# Patient Record
Sex: Male | Born: 1970 | Race: White | Hispanic: No | Marital: Single | Smoking: Never smoker
Health system: Southern US, Community
[De-identification: ages and names within clinical notes are randomized; demographics above are authoritative.]

## PROBLEM LIST (undated history)

## (undated) DIAGNOSIS — M1220 Villonodular synovitis (pigmented), unspecified site: Secondary | ICD-10-CM

## (undated) DIAGNOSIS — R413 Other amnesia: Secondary | ICD-10-CM

## (undated) DIAGNOSIS — M1990 Unspecified osteoarthritis, unspecified site: Secondary | ICD-10-CM

## (undated) DIAGNOSIS — I83891 Varicose veins of right lower extremities with other complications: Secondary | ICD-10-CM

## (undated) DIAGNOSIS — Z950 Presence of cardiac pacemaker: Secondary | ICD-10-CM

## (undated) DIAGNOSIS — G4730 Sleep apnea, unspecified: Secondary | ICD-10-CM

## (undated) DIAGNOSIS — R011 Cardiac murmur, unspecified: Secondary | ICD-10-CM

## (undated) DIAGNOSIS — T7840XA Allergy, unspecified, initial encounter: Secondary | ICD-10-CM

## (undated) DIAGNOSIS — F419 Anxiety disorder, unspecified: Secondary | ICD-10-CM

## (undated) DIAGNOSIS — M5430 Sciatica, unspecified side: Secondary | ICD-10-CM

## (undated) DIAGNOSIS — F32A Depression, unspecified: Secondary | ICD-10-CM

## (undated) DIAGNOSIS — E079 Disorder of thyroid, unspecified: Secondary | ICD-10-CM

## (undated) DIAGNOSIS — I499 Cardiac arrhythmia, unspecified: Secondary | ICD-10-CM

## (undated) HISTORY — PX: CARDIAC CATHETERIZATION: SHX172

## (undated) HISTORY — DX: Other amnesia: R41.3

## (undated) HISTORY — PX: OTHER SURGICAL HISTORY: SHX169

## (undated) HISTORY — DX: Sciatica, unspecified side: M54.30

## (undated) HISTORY — DX: Varicose veins of right lower extremity with other complications: I83.891

## (undated) HISTORY — DX: Presence of cardiac pacemaker: Z95.0

## (undated) HISTORY — DX: Disorder of thyroid, unspecified: E07.9

## (undated) HISTORY — DX: Villonodular synovitis (pigmented), unspecified site: M12.20

## (undated) HISTORY — DX: Sleep apnea, unspecified: G47.30

## (undated) HISTORY — DX: Unspecified osteoarthritis, unspecified site: M19.90

## (undated) HISTORY — DX: Allergy, unspecified, initial encounter: T78.40XA

## (undated) HISTORY — PX: INSERT / REPLACE / REMOVE PACEMAKER: SUR710

## (undated) HISTORY — DX: Cardiac murmur, unspecified: R01.1

---

## 2015-03-29 ENCOUNTER — Encounter: Payer: Self-pay | Admitting: Internal Medicine

## 2016-05-18 ENCOUNTER — Encounter: Payer: Self-pay | Admitting: Internal Medicine

## 2018-03-14 ENCOUNTER — Encounter: Payer: Self-pay | Admitting: Family Medicine

## 2018-03-14 ENCOUNTER — Ambulatory Visit: Payer: Medicare Other | Admitting: Family Medicine

## 2018-03-14 VITALS — BP 120/78 | HR 65 | Temp 98.6°F | Ht 74.0 in | Wt 247.9 lb

## 2018-03-14 DIAGNOSIS — M549 Dorsalgia, unspecified: Secondary | ICD-10-CM

## 2018-03-14 DIAGNOSIS — G4733 Obstructive sleep apnea (adult) (pediatric): Secondary | ICD-10-CM

## 2018-03-14 DIAGNOSIS — Z9989 Dependence on other enabling machines and devices: Secondary | ICD-10-CM

## 2018-03-14 DIAGNOSIS — M1220 Villonodular synovitis (pigmented), unspecified site: Secondary | ICD-10-CM | POA: Insufficient documentation

## 2018-03-14 DIAGNOSIS — R918 Other nonspecific abnormal finding of lung field: Secondary | ICD-10-CM

## 2018-03-14 DIAGNOSIS — G8929 Other chronic pain: Secondary | ICD-10-CM | POA: Insufficient documentation

## 2018-03-14 DIAGNOSIS — R001 Bradycardia, unspecified: Secondary | ICD-10-CM | POA: Diagnosis not present

## 2018-03-14 DIAGNOSIS — F411 Generalized anxiety disorder: Secondary | ICD-10-CM

## 2018-03-14 DIAGNOSIS — N400 Enlarged prostate without lower urinary tract symptoms: Secondary | ICD-10-CM

## 2018-03-14 DIAGNOSIS — G894 Chronic pain syndrome: Secondary | ICD-10-CM

## 2018-03-14 DIAGNOSIS — M2140 Flat foot [pes planus] (acquired), unspecified foot: Secondary | ICD-10-CM

## 2018-03-14 DIAGNOSIS — E039 Hypothyroidism, unspecified: Secondary | ICD-10-CM

## 2018-03-14 DIAGNOSIS — M159 Polyosteoarthritis, unspecified: Secondary | ICD-10-CM

## 2018-03-14 DIAGNOSIS — M109 Gout, unspecified: Secondary | ICD-10-CM

## 2018-03-14 NOTE — Progress Notes (Signed)
Subjective:  By signing my name below, I, Stann Ore, attest that this documentation has been prepared under the direction and in the presence of Meredith Staggers, MD. Electronically Signed: Stann Ore, Scribe. 03/14/2018 , 3:10 PM .  Patient was seen in Room 9 .   Patient ID: Mark Strong, male    DOB: 10/21/1970, 47 y.o.   MRN: [id number] Chief Complaint  Patient presents with  . Establish Care    give history and get to know each other  . Depression    PH Q9=3   HPI Mark Strong is a 47 y.o. male Here to establish care. Patient recently moved here from [place], [place] on Aug 8th. He came here due to cost of living, church and parents needed a cheaper assisted living. He is on Social Security Disability with back pain and pacemaker in placed. He used to live in [place], [place] for about couple of months.   He was followed by specialists and PCP in [place].  Orthopedist: PVNS, recheck every 6 months.  Blood specialist: enlarged spleen.  Oncology: both grandfather died of cancer, brother is in remission right now for cancer; father might have cancer now.  Neurologist: chronic back pain, sciatica, mild scoliosis, neck and upper back; no surgery done yet. He is on Belbuca and doing well on it, prescribed PCP; he has about 15 films left. He mentions he was previously on other narcotic pain medications, but he didn't like the side effects with them. He was also followed for generalized anxiety, taking Effexor 75 mg qd.  Cardiologist: history of bradycardia with pulse running 20-25. 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  Pulmonologist: he had 2-3 non-cancerous nodules in his lungs and was monitored every 6-12 months. Last seen on July 24th; per March 2018 note, there were 5 nodules noted. He denies any changes in size of the nodules. He denies history of biopsy. He denies history of COPD. He has a history of sleep apnea, and has a  CPAP machine at home. He denies any inhalers for his asthma.  Urologist: mildly enlarged prostate, followed every few months. He takes Flomax for it. He hasn't had biopsy done. PSA monitored.  Endocrinologist: history of hypothyroidism. Recently had thyroid biopsy done at the beginning of July, but didn't get enough cells. He was suggested another biopsy done in about 6 months. He mentions his prolactin levels were slightly elevated. His father has history of pituitary tumor.  Podiatrist: he has flat feet, and has special custom orthotics. He was developing hammer toes. He used to weigh about 310 lbs, but changed diet, and has been exercising with walking and biking.  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 He also had gout in the past, but hasn't had a flare in a while. He took meloxicam 15 mg prn.  Medications: He takes amlodipine 2.5 mg half pill qd. He takes Lipitor 10 mg qd.   Health Maintenance He declines flu shot today.    Positive Depression screening Depression screen [facility] 2/9 03/14/2018  Decreased Interest 1  Down, Depressed, Hopeless 1  PHQ - 2 Score 2  Altered sleeping 1  Tired, decreased energy 0  Change in appetite 0  Feeling bad or failure about yourself  0  Trouble concentrating 0  Moving slowly or fidgety/restless 0  Suicidal thoughts 0  PHQ-9 Score 3  Difficult doing work/chores Not difficult at all     Patient Active Problem List   Diagnosis  Date Noted  . 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 Chronic back pain 03/14/2018  . Bradycardia 03/14/2018   Past Medical History:  Diagnosis Date  . PVNS (pigmented villonodular synovitis)    History reviewed. No pertinent surgical history. No Known Allergies Prior to Admission medications   Not on File   Social History   Socioeconomic History  . Marital status: Single    Spouse name: Not on file  . Number of children: Not on file  . Years of education: Not on file  . Highest  education level: Not on file  Occupational History  . Not on file  Social Needs  . Financial resource strain: Not on file  . Food insecurity:    Worry: Not on file    Inability: Not on file  . Transportation needs:    Medical: Not on file    Non-medical: Not on file  Tobacco Use  . Smoking status: Never Smoker  . Smokeless tobacco: Never Used  Substance and Sexual Activity  . Alcohol use: Not Currently  . Drug use: Yes  . Sexual activity: Yes  Lifestyle  . Physical activity:    Days per week: Not on file    Minutes per session: Not on file  . Stress: Not on file  Relationships  . Social connections:    Talks on phone: Not on file    Gets together: Not on file    Attends religious service: Not on file    Active member of club or organization: Not on file    Attends meetings of clubs or organizations: Not on file    Relationship status: Not on file  . Intimate partner violence:    Fear of current or ex partner: Not on file    Emotionally abused: Not on file    Physically abused: Not on file    Forced sexual activity: Not on file  Other Topics Concern  . Not on file  Social History Narrative  . Not on file   Review of Systems  Constitutional: Negative for fatigue and unexpected weight change.  Eyes: Negative for visual disturbance.  Respiratory: Negative for cough, chest tightness and shortness of breath.   Cardiovascular: Negative for chest pain, palpitations and leg swelling.  Gastrointestinal: Negative for abdominal pain and blood in stool.  Neurological: Negative for dizziness, light-headedness and headaches.       Objective:   Physical Exam  Constitutional: He is oriented to person, place, and time. He appears well-developed and well-nourished. No distress.  HENT:  Head: Normocephalic and atraumatic.  Eyes: Pupils are equal, round, and reactive to light. EOM are normal.  Neck: Neck supple. No JVD present. Carotid bruit is not present.  Cardiovascular:  Normal rate, regular rhythm and normal heart sounds.  No murmur heard. Pulmonary/Chest: Effort normal and breath sounds normal. No respiratory distress. He has no rales.  Musculoskeletal: Normal range of motion. He exhibits no edema.  Neurological: He is alert and oriented to person, place, and time.  Skin: Skin is warm and dry.  Psychiatric: He has a normal mood and affect. His behavior is normal.  Nursing note and vitals reviewed.   Vitals:   03/14/18 1338 03/14/18 1346  BP: (!) 144/87 120/78  Pulse: 65   Temp: 98.6 F (37 C)   TempSrc: Oral   SpO2: 100%   Weight: 247 lb 14.4 oz (112.4 kg)   Height: 6\' 2"  (1.88 m)        Assessment & Plan:  Mark Strong is a 47 y.o. male Chronic pain disorder - Plan: Ambulatory referral to Pain Clinic  -Refer to pain management.  Initially had discussed possible refill of Belbuca if he is running out prior to that visit, but that med will need to be prescribed by pain management or provider certified to prescribe Suboxone.  -Continue gabapentin same dose  PVNS (pigmented villonodular synovitis) - Plan: Ambulatory referral to Pain Clinic  -Long-standing issue, referral as above.  May also need care with orthopedic oncology.  Chronic back pain, unspecified back location, unspecified back pain laterality - Plan: Ambulatory referral to Pain Clinic  Bradycardia - Plan: Ambulatory referral to Cardiology  -Referral to cardiology for ongoing care, asymptomatic at present and has pacemaker.  Lung nodules - Plan: Ambulatory referral to Pulmonology  -Reportedly stable, will refer to pulmonology for ongoing care and decision on monitoring frequency  OSA on CPAP - Plan: Ambulatory referral to Pulmonology  -Tolerating CPAP, no changes at this time.  Anxiety state  -Reports stable symptoms on Effexor, no changes at this time.  Enlarged prostate - Plan: Ambulatory referral to Urology  -Refer to urology for ongoing care and monitoring of  enlarged prostate.  Continue Flomax at this time  Hypothyroidism, unspecified type - Plan: Ambulatory referral to Endocrinology  -Refer to endocrinology for hypothyroidism treatment as well as further discussion of prior biopsies, possible nodule and need for further imaging  Gout, unspecified cause, unspecified chronicity, unspecified site - Plan: Ambulatory referral to Rheumatology Osteoarthritis of multiple joints, unspecified osteoarthritis type - Plan: Ambulatory referral to Rheumatology  -Previously followed by rheumatologist.  Overall stable gout but will refer to rheumatology locally for continued management.  If he remains stable, that can be followed here  Pes planus, unspecified laterality - Plan: Ambulatory referral to Podiatry  -Requested referral to podiatry for ongoing care given need for orthotics.  Recheck 1 month to review his health history further and to determine if further refills needed  No orders of the defined types were placed in this encounter.  Patient Instructions    Let me know if temporary refill of Belbuca needed prior to meeting with pain specialist.   I will place other referrals as we discussed.   Nice meeting you. Please follow up in next 1 month to review history further and medication refills/labs if needed.   If you have lab work done today you will be contacted with your lab results within the next 2 weeks.  If you have not heard from [place] then please contact us. The fastest way to get your results is to register for My Chart.   IF you received an x-ray today, you will receive an invoice from [facility]. Please contact [facility] at [phone] with questions or concerns regarding your invoice.   IF you received labwork today, you will receive an invoice from [facility]. Please contact [facility] at [phone] with questions or concerns regarding your invoice.   Our billing staff will not be able to assist you with questions  regarding bills from these companies.  You will be contacted with the lab results as soon as they are available. The fastest way to get your results is to activate your My Chart account. Instructions are located on the last page of this paperwork. If you have not heard from [place] regarding the results in 2 weeks, please contact this office.       I personally performed the services described in this documentation, which was scribed in my presence.  The recorded information has been reviewed and considered for accuracy and completeness, addended by me as needed, and agree with information above.  Signed,   Meredith Staggers, MD Primary Care at [facility].  03/16/18 12:34 PM

## 2018-03-14 NOTE — Patient Instructions (Addendum)
  Let me know if temporary refill of Belbuca needed prior to meeting with pain specialist.   I will place other referrals as we discussed.   Nice meeting you. Please follow up in next 1 month to review history further and medication refills/labs if needed.   If you have lab work done today you will be contacted with your lab results within the next 2 weeks.  If you have not heard from [place] then please contact us. The fastest way to get your results is to register for My Chart.   IF you received an x-ray today, you will receive an invoice from [facility]. Please contact [facility] at [phone] with questions or concerns regarding your invoice.   IF you received labwork today, you will receive an invoice from [facility]. Please contact [facility] at [phone] with questions or concerns regarding your invoice.   Our billing staff will not be able to assist you with questions regarding bills from these companies.  You will be contacted with the lab results as soon as they are available. The fastest way to get your results is to activate your My Chart account. Instructions are located on the last page of this paperwork. If you have not heard from [place] regarding the results in 2 weeks, please contact this office.

## 2018-03-31 ENCOUNTER — Telehealth: Payer: Self-pay | Admitting: Family Medicine

## 2018-03-31 NOTE — Telephone Encounter (Signed)
Copied from CRM [id number]. Topic: Quick Communication - Rx Refill/Question >> Mar 31, 2018  1:04 PM Maia Petties wrote: Medication: BELBUCA 75 MCG FILM  - pt will be out 10/17 and has appt with pain mgmt on 10/23 - he states Dr. Neva Seat advised he would send in medication if needed in between. Please advise.   Last OV Dr. Neva Seat 03/14/18 Last refill not from Dr. Neva Seat or [place] office - 03/04/18 no refills  Preferred Pharmacy (with phone number or street name): [id number] [place], [place] - [address] AT [facility] [address] [facility] [address] [phone] (Phone) [phone] (Fax)

## 2018-04-01 NOTE — Telephone Encounter (Signed)
Please advise 

## 2018-04-03 ENCOUNTER — Ambulatory Visit: Payer: Medicare Other

## 2018-04-03 ENCOUNTER — Ambulatory Visit: Payer: Medicare Other | Admitting: Podiatry

## 2018-04-03 ENCOUNTER — Encounter: Payer: Self-pay | Admitting: Podiatry

## 2018-04-03 VITALS — BP 124/78 | HR 60 | Resp 16

## 2018-04-03 DIAGNOSIS — L603 Nail dystrophy: Secondary | ICD-10-CM

## 2018-04-03 DIAGNOSIS — M2142 Flat foot [pes planus] (acquired), left foot: Secondary | ICD-10-CM | POA: Diagnosis not present

## 2018-04-03 DIAGNOSIS — M722 Plantar fascial fibromatosis: Secondary | ICD-10-CM | POA: Diagnosis not present

## 2018-04-03 DIAGNOSIS — M2141 Flat foot [pes planus] (acquired), right foot: Secondary | ICD-10-CM | POA: Diagnosis not present

## 2018-04-03 MED ORDER — BUPRENORPHINE HCL 75 MCG BU FILM: 1.0000 | ORAL_FILM | Freq: Two times a day (BID) | BUCCAL | 0 refills | Status: AC

## 2018-04-03 NOTE — Telephone Encounter (Signed)
Today I have utilized the [place] Controlled Substance Registry's online query to confirm compliance regarding the patient's controlled medications. My review reveals appropriate prescription fills.   Sent in 7d refill so pt can get to pain med appt on 10/23

## 2018-04-03 NOTE — Progress Notes (Signed)
Subjective:  Patient ID: Mark Strong, male    DOB: 20-Jan-1971,  MRN: [id number] HPI Chief Complaint  Patient presents with  . Foot Pain    Requesting foot exam - just moved from [place], [place] x 2 months ago, brought notes, xrays, wears custom orthotics, would like to get established with a podiatrist, sometimes tenderness medial left foot  . New Patient (Initial Visit)    47 y.o. male presents with the above complaint.   ROS: Denies fever chills nausea vomiting muscle aches pains calf pain back pain chest pain shortness of breath.  He is on disability from chronic foot pain neck pain and has a pacemaker.  Past Medical History:  Diagnosis Date  . Pacemaker   . PVNS (pigmented villonodular synovitis)    No past surgical history on file.  Current Outpatient Medications:  .  allopurinol (ZYLOPRIM) 300 MG tablet, Take 300 mg by mouth daily., Disp: , Rfl:  .  amLODipine (NORVASC) 2.5 MG tablet, Take 1.25 mg by mouth daily., Disp: , Rfl:  .  atorvastatin (LIPITOR) 10 MG tablet, , Disp: , Rfl: 0 .  Buprenorphine HCl (BELBUCA) 75 MCG FILM, Place 1 Film under the tongue 2 (two) times daily for 7 days., Disp: 14 each, Rfl: 0 .  Docusate Sodium (COLACE PO), Take by mouth., Disp: , Rfl:  .  gabapentin (NEURONTIN) 600 MG tablet, Take 600 mg by mouth 2 (two) times daily., Disp: , Rfl:  .  levothyroxine (SYNTHROID) 75 MCG tablet, Take 75 mcg by mouth daily before breakfast., Disp: , Rfl:  .  meloxicam (MOBIC) 15 MG tablet, Take 15 mg by mouth daily., Disp: , Rfl:  .  tadalafil (CIALIS) 5 MG tablet, Take 5 mg by mouth daily as needed for erectile dysfunction., Disp: , Rfl:  .  tamsulosin (FLOMAX) 0.4 MG CAPS capsule, Take 0.4 mg by mouth daily., Disp: , Rfl:  .  venlafaxine (EFFEXOR) 75 MG tablet, Take 75 mg by mouth daily., Disp: , Rfl:   Allergies  Allergen Reactions  . Betadine [Povidone Iodine] Rash   Review of Systems Objective:   Vitals:   04/03/18 1615  BP: 124/78    Pulse: 60  Resp: 16    General: Well developed, nourished, in no acute distress, alert and oriented x3   Dermatological: Skin is warm, dry and supple bilateral. Nails x 10 are well maintained; remaining integument appears unremarkable at this time. There are no open sores, no preulcerative lesions, no rash or signs of infection present.  Vascular: Dorsalis Pedis artery and Posterior Tibial artery pedal pulses are 2/4 bilateral with immedate capillary fill time. Pedal hair growth present. No varicosities and no lower extremity edema present bilateral.   Neruologic: Grossly intact via light touch bilateral. Vibratory intact via tuning fork bilateral. Protective threshold with Semmes Wienstein monofilament intact to all pedal sites bilateral. Patellar and Achilles deep tendon reflexes 2+ bilateral. No Babinski or clonus noted bilateral.   Musculoskeletal: No gross boney pedal deformities bilateral. No pain, crepitus, or limitation noted with foot and ankle range of motion bilateral. Muscular strength 5/5 in all groups tested bilateral.  He has pain on palpation of the left heel at the plantar fashion calcaneal insertion site.  He has pain on palpation of the fourth fifth tarsometatarsal joint and frontal plane range of motion the same area.  Has some mild tenderness overlying the sinus tarsi but no tenderness on end range of motion of the subtalar joint.  Gait: Unassisted, Nonantalgic.  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  Plan: Discussed etiology pathology conservative or surgical therapies.  At this point I discussed in great detail to him today about the appropriate shoes to wear with his orthotics and how to choose the appropriate pair shoes.  I feel that his issues that is resulting in some of the plantar fasciitis and lateral compensation.  We took samples of the skin and nail today to be sent for  pathologic evaluation and will await the results I will follow-up with him in 1 month.     Max T. [place], [place]

## 2018-04-03 NOTE — Addendum Note (Signed)
Addended by: Kristian Covey on: 04/03/2018 05:37 PM   Modules accepted: Orders

## 2018-04-03 NOTE — Telephone Encounter (Signed)
Patient called to say that medication required a Prior Auth and paperwork was sent over to the office to be filled and returned. Request a call back with information. Ph# [phone]

## 2018-04-04 ENCOUNTER — Telehealth: Payer: Self-pay | Admitting: Family Medicine

## 2018-04-04 DIAGNOSIS — G894 Chronic pain syndrome: Secondary | ICD-10-CM

## 2018-04-04 NOTE — Telephone Encounter (Signed)
Copied from CRM [id number]. Topic: Quick Communication - See Telephone Encounter >> Apr 04, 2018  4:14 PM Luanna Cole wrote: CRM for notification. See Telephone encounter for: 04/04/18.Buprenorphine HCl (BELBUCA) 75 MCG FILM [[id number]]  pt called and stated that insurance company did not accept the PA for medication. Pt would like a call back. Please advise [phone]

## 2018-04-04 NOTE — Telephone Encounter (Signed)
Sherry with [employer] requesting a call back to see if pts pain is severe enough that he requires around the clock opioid usage and are their any other pain managements such as hydrocodone, morphine and fentanyl are options for this member? CB#: [phone] opt 5. Ref: [id number]

## 2018-04-04 NOTE — Telephone Encounter (Signed)
Please advise 

## 2018-04-05 NOTE — Telephone Encounter (Signed)
Pt. Called again inquiring on status of prior authorization on medication. Pt. Requests to be called when a decision is reached on course of action.

## 2018-04-05 NOTE — Telephone Encounter (Signed)
Please check status.  Dr. Clelia Croft was able to send in temporary Rx for his buprenorphine, but not sure it was authorized. He has been on multiple other narcotic pain medications based on discussion at his visit with me, but did not tolerate due to side effects.

## 2018-04-07 NOTE — Telephone Encounter (Signed)
Patient was denied his medication Belbuca. The insurance company faxed a paper over needing more clinical information. Will placed paper in providers box at nurses station.

## 2018-04-07 NOTE — Telephone Encounter (Signed)
Patient is requesting a call back regarding the medication.  Patient had this happen previously he is wanting to know should he try another medication to avoid the insurance.  Please advise [phone]

## 2018-04-07 NOTE — Telephone Encounter (Signed)
FYI.  see note below.  

## 2018-04-08 ENCOUNTER — Telehealth: Payer: Self-pay | Admitting: *Deleted

## 2018-04-08 MED ORDER — OXYCODONE-ACETAMINOPHEN 5-325 MG PO TABS: 1.0000 | ORAL_TABLET | Freq: Four times a day (QID) | ORAL | 0 refills | Status: DC | PRN

## 2018-04-08 NOTE — Telephone Encounter (Signed)
I am ok sending in temporary med other than Belbuca until pain mgt eval tomorrow They may be able to get other med authorized easier.  what has he used before?

## 2018-04-08 NOTE — Addendum Note (Signed)
Addended by: Meredith Staggers R on: 04/08/2018 12:54 PM   Modules accepted: Orders

## 2018-04-08 NOTE — Telephone Encounter (Signed)
10 percocet sent to pharmacy until he meets with pain management tomorrow - meds to be filled further by pain mgt.

## 2018-04-08 NOTE — Telephone Encounter (Signed)
Spoke to patient the only medication he can take without the side effect is Percocet. Thank you

## 2018-04-08 NOTE — Telephone Encounter (Signed)
Spoke to patient, the Percocet prescription was sent to [facility]. The patient will call to check if medication is ready for pick up.

## 2018-04-14 ENCOUNTER — Encounter: Payer: Self-pay | Admitting: Family Medicine

## 2018-04-14 ENCOUNTER — Ambulatory Visit (INDEPENDENT_AMBULATORY_CARE_PROVIDER_SITE_OTHER): Payer: Medicare Other | Admitting: Family Medicine

## 2018-04-14 ENCOUNTER — Other Ambulatory Visit: Payer: Self-pay

## 2018-04-14 VITALS — BP 111/79 | HR 60 | Temp 97.7°F | Resp 16 | Ht 74.0 in | Wt 244.2 lb

## 2018-04-14 DIAGNOSIS — G894 Chronic pain syndrome: Secondary | ICD-10-CM | POA: Diagnosis not present

## 2018-04-14 DIAGNOSIS — N529 Male erectile dysfunction, unspecified: Secondary | ICD-10-CM | POA: Diagnosis not present

## 2018-04-14 DIAGNOSIS — M1220 Villonodular synovitis (pigmented), unspecified site: Secondary | ICD-10-CM

## 2018-04-14 MED ORDER — MELOXICAM 15 MG PO TABS: 15.0000 mg | ORAL_TABLET | Freq: Every day | ORAL | 1 refills | Status: DC

## 2018-04-14 MED ORDER — TADALAFIL 10 MG PO TABS: 10.0000 mg | ORAL_TABLET | ORAL | 1 refills | Status: DC | PRN

## 2018-04-14 NOTE — Progress Notes (Signed)
Subjective:  By signing my name below, I, Mark Strong, attest that this documentation has been prepared under the direction and in the presence of Mark Flood, MD Electronically Signed: Charline Bills, ED Scribe 04/14/2018 at 12:00 PM.   Patient ID: Mark Strong, male    DOB: November 28, 1970, 47 y.o.   MRN: [id number]  Chief Complaint  Patient presents with  . Medication Refill    need refills on allopurinol, meloxicam, cialis  . FORM FILLED OUT    need scat form filled out   HPI  Mark Strong is a 47 y.o. male who presents to Primary Care at [facility] for medication refill. New pt 9/27 with multiple chronic issues including chronic pain in back, feet, knees. Pt also presents with a form for SCAT due to difficulty ambulating to a regular stop or standing to wait for a bus. Pt has been ambulating with a cane lately due to bilat knee pain.  H/o Osteoarthritis and Gout Mobic prn, allopurinol for gout. No recent uric acid. Referred to rheumatology last OV, [facility] - Pt has been off allopurinol for sev months; states he has uric acid tested by rheumatology which was normal. Denies recent flare. He is still Mobic prn. Pt has an appointment with ortho on 10/31.  ED H/o enlarged prostate on Flomax, referred to urology at last OV. Has taken Cialis 5 mg in the past. - Pt was reportedly taking 10 mg every other wk, occasionally twice/wk. Denies back pain, change in vision or hearing, cp, sob, any new side-effects from meds. He is still waiting for a call from urology.  Patient Active Problem List   Diagnosis Date Noted  . 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 Chronic back pain 03/14/2018  . Bradycardia 03/14/2018   Past Medical History:  Diagnosis Date  . Pacemaker   . PVNS (pigmented villonodular synovitis)    History reviewed. No pertinent surgical history. Allergies  Allergen Reactions  . Betadine [Povidone Iodine] Rash   Prior to  Admission medications   Medication Sig Start Date End Date Taking? Authorizing Provider  allopurinol (ZYLOPRIM) 300 MG tablet Take 300 mg by mouth daily.    [provider]  amLODipine (NORVASC) 2.5 MG tablet Take 1.25 mg by mouth daily.    [provider]  atorvastatin (LIPITOR) 10 MG tablet  02/24/18   [provider]  Docusate Sodium (COLACE PO) Take by mouth.    [provider]  gabapentin (NEURONTIN) 600 MG tablet Take 600 mg by mouth 2 (two) times daily.    [provider]  levothyroxine (SYNTHROID) 75 MCG tablet Take 75 mcg by mouth daily before breakfast.    [provider]  meloxicam (MOBIC) 15 MG tablet Take 15 mg by mouth daily.    [provider]  oxyCODONE-acetaminophen (PERCOCET/ROXICET) 5-325 MG tablet Take 1 tablet by mouth every 6 (six) hours as needed for severe pain. 04/08/18   Mark Flood, MD  tadalafil (CIALIS) 5 MG tablet Take 5 mg by mouth daily as needed for erectile dysfunction.    [provider]  tamsulosin (FLOMAX) 0.4 MG CAPS capsule Take 0.4 mg by mouth daily.    [provider]  venlafaxine (EFFEXOR) 75 MG tablet Take 75 mg by mouth daily.    [provider]   Social History   Socioeconomic History  . Marital status: Single    Spouse name: Not on file  . Number of children: Not on file  . Years  of education: Not on file  . Highest education level: Not on file  Occupational History  . Not on file  Social Needs  . Financial resource strain: Not on file  . Food insecurity:    Worry: Not on file    Inability: Not on file  . Transportation needs:    Medical: Not on file    Non-medical: Not on file  Tobacco Use  . Smoking status: Never Smoker  . Smokeless tobacco: Never Used  Substance and Sexual Activity  . Alcohol use: Not Currently  . Drug use: Yes  . Sexual activity: Yes  Lifestyle  . Physical activity:    Days per week: Not on file    Minutes per  session: Not on file  . Stress: Not on file  Relationships  . Social connections:    Talks on phone: Not on file    Gets together: Not on file    Attends religious service: Not on file    Active member of club or organization: Not on file    Attends meetings of clubs or organizations: Not on file    Relationship status: Not on file  . Intimate partner violence:    Fear of current or ex partner: Not on file    Emotionally abused: Not on file    Physically abused: Not on file    Forced sexual activity: Not on file  Other Topics Concern  . Not on file  Social History Narrative  . Not on file   Review of Systems  HENT: Negative for hearing loss.   Eyes: Negative for visual disturbance.  Respiratory: Negative for chest tightness and shortness of breath.   Cardiovascular: Negative for chest pain.  Musculoskeletal: Positive for arthralgias. Negative for back pain.  Neurological: Negative for dizziness, light-headedness and headaches.      Objective:   Physical Exam  Constitutional: He is oriented to person, place, and time. He appears well-developed and well-nourished. No distress.  HENT:  Head: Normocephalic and atraumatic.  Eyes: Conjunctivae and EOM are normal.  Neck: Neck supple. No tracheal deviation present.  Cardiovascular: Normal rate and regular rhythm.  Pulmonary/Chest: Effort normal and breath sounds normal. No respiratory distress.  Musculoskeletal: Normal range of motion.  Neurological: He is alert and oriented to person, place, and time.  Skin: Skin is warm and dry.  Psychiatric: He has a normal mood and affect. His behavior is normal.  Nursing note and vitals reviewed.  Vitals:   04/14/18 1113  BP: 111/79  Pulse: 60  Resp: 16  Temp: 97.7 F (36.5 C)  TempSrc: Oral  SpO2: 98%  Weight: 244 lb 3.2 oz (110.8 kg)  Height: 6\' 2"  (1.88 m)      Assessment & Plan:   [name] is a 47 y.o. male Chronic pain syndrome - Plan: meloxicam (MOBIC) 15 MG  tablet PVNS (pigmented villonodular synovitis)  -Refilled meloxicam, but now has established with local pain management provider and will be seen orthopedics.  Would recommend discussing that medication with 1 of those providers for future refills.  -Allopurinol restart to be decided by rheumatology.  Erectile dysfunction, unspecified erectile dysfunction type - Plan: tadalafil (CIALIS) 10 MG tablet  -Tolerating Cialis, denies any new side effects, potential risks and side effects were discussed.  Refilled.  Phone numbers provided for other specialists that he has not heard from yet.  Recheck 3 months to review chronic medical issues.  Meds ordered this encounter  Medications  . meloxicam (MOBIC) 15  MG tablet    Sig: Take 1 tablet (15 mg total) by mouth daily.    Dispense:  30 tablet    Refill:  1  . tadalafil (CIALIS) 10 MG tablet    Sig: Take 1 tablet (10 mg total) by mouth every other day as needed for erectile dysfunction.    Dispense:  30 tablet    Refill:  1   Patient Instructions    Allopurinol restart can be decided by rheumatology.   mobic refilled, but talk to pain management and ortho about any changes.   I refilled Cialis.  You have the option of trying a half dose to see if that is just as effective.  Follow-up in about 3 months to review medications and evaluations with specialists at that time.  Sooner if needed.   Here are the numbers to the practices where you were referred:  Urology: Cardiology: Endocrinology:      If you have lab work done today you will be contacted with your lab results within the next 2 weeks.  If you have not heard from [place] then please contact us. The fastest way to get your results is to register for My Chart.   IF you received an x-ray today, you will receive an invoice from [facility]. Please contact [facility] at [phone] with questions or concerns regarding your invoice.   IF you received labwork today,  you will receive an invoice from [facility]. Please contact [facility] at [phone] with questions or concerns regarding your invoice.   Our billing staff will not be able to assist you with questions regarding bills from these companies.  You will be contacted with the lab results as soon as they are available. The fastest way to get your results is to activate your My Chart account. Instructions are located on the last page of this paperwork. If you have not heard from [place] regarding the results in 2 weeks, please contact this office.       I personally performed the services described in this documentation, which was scribed in my presence. The recorded information has been reviewed and considered for accuracy and completeness, addended by me as needed, and agree with information above.  Signed,   Meredith Staggers, MD Primary Care at [facility].  04/16/18 10:01 PM

## 2018-04-14 NOTE — Patient Instructions (Addendum)
  Allopurinol restart can be decided by rheumatology.   mobic refilled, but talk to pain management and ortho about any changes.   I refilled Cialis.  You have the option of trying a half dose to see if that is just as effective.  Follow-up in about 3 months to review medications and evaluations with specialists at that time.  Sooner if needed.   Here are the numbers to the practices where you were referred:  Urology: Cardiology: Endocrinology:      If you have lab work done today you will be contacted with your lab results within the next 2 weeks.  If you have not heard from [place] then please contact us. The fastest way to get your results is to register for My Chart.   IF you received an x-ray today, you will receive an invoice from [facility] Radiology. Please contact [facility]iology at [phone] with questions or concerns regarding your invoice.   IF you received labwork today, you will receive an invoice from [facility]. Please contact [facility] at [phone] with questions or concerns regarding your invoice.   Our billing staff will not be able to assist you with questions regarding bills from these companies.  You will be contacted with the lab results as soon as they are available. The fastest way to get your results is to activate your My Chart account. Instructions are located on the last page of this paperwork. If you have not heard from [place] regarding the results in 2 weeks, please contact this office.

## 2018-04-15 ENCOUNTER — Telehealth: Payer: Self-pay | Admitting: Family Medicine

## 2018-04-15 NOTE — Telephone Encounter (Signed)
Called pt to inform him to call his prefered pharmacy, [facility] [address] and have his RX transferred. Dr Neva Seat ordered it yesterday and it was sent to [facility] on [address]. He will call back if there are any problems.

## 2018-04-15 NOTE — Telephone Encounter (Signed)
Copied from CRM [id number]. Topic: Quick Communication - Rx Refill/Question >> Apr 15, 2018  3:15 PM [place], [place] D wrote: Medication: tadalafil (CIALIS) 10 MG tablet / Pt would like to have this medication sent to compounding pharmacy so that he can pay the cheaper price. Please advise.   Has the patient contacted their pharmacy? Yes.   (Agent: If no, request that the patient contact the pharmacy for the refill.) (Agent: If yes, when and what did the pharmacy advise?)  Preferred Pharmacy (with phone number or street name): [facility] - [place], [place] - [address] [phone] (Phone) [phone] (Fax)    Agent: Please be advised that RX refills may take up to 3 business days. We ask that you follow-up with your pharmacy.

## 2018-04-16 ENCOUNTER — Encounter: Payer: Self-pay | Admitting: Pulmonary Disease

## 2018-04-16 ENCOUNTER — Ambulatory Visit: Payer: Medicare Other | Admitting: Pulmonary Disease

## 2018-04-16 VITALS — BP 130/88 | HR 59

## 2018-04-16 DIAGNOSIS — Z9989 Dependence on other enabling machines and devices: Secondary | ICD-10-CM

## 2018-04-16 DIAGNOSIS — R911 Solitary pulmonary nodule: Secondary | ICD-10-CM

## 2018-04-16 DIAGNOSIS — R9389 Abnormal findings on diagnostic imaging of other specified body structures: Secondary | ICD-10-CM | POA: Diagnosis not present

## 2018-04-16 DIAGNOSIS — G4733 Obstructive sleep apnea (adult) (pediatric): Secondary | ICD-10-CM

## 2018-04-16 NOTE — Progress Notes (Signed)
Mark Strong    [id number]    [birth year]  Primary Care Physician:Greene, Asencion Partridge, MD  Referring Physician: [name], MD [address] [place], [place] [address]  Chief complaint:   Patient being seen for obstructive sleep apnea Multiple lung nodules  HPI:  Patient with obstructive sleep apnea diagnosed in April 2018-has been on auto titrating CPAP-improving symptoms Memory is better, fatigue is better, daytime sleepiness is better Multiple lung nodules diagnosed about 3 years ago CT scan report from July 2019-no new nodules, previously identified nodules are stable Patient has no significant symptoms of present Did have a couple of occasions when Mark Strong felt some shortness of breath that self dissipated  Past history of asthma  Dad did smoke, has a history of COPD No family history of lung cancer Mark Strong worked in Engineering geologist Never smoker  Patient usually goes to bed about 11 PM to 2 AM, takes about 20 minutes to fall asleep Wakes up about 2-3 times during the night, gets out of bed between 8 and 10 AM  Outpatient Encounter Medications as of 04/16/2018  Medication Sig  . allopurinol (ZYLOPRIM) 300 MG tablet Take 300 mg by mouth daily.  Marland Kitchen amLODipine (NORVASC) 2.5 MG tablet Take 1.25 mg by mouth daily.  Marland Kitchen atorvastatin (LIPITOR) 10 MG tablet   . Docusate Sodium (COLACE PO) Take by mouth.  . gabapentin (NEURONTIN) 600 MG tablet Take 600 mg by mouth 2 (two) times daily.  Marland Kitchen levothyroxine (SYNTHROID) 75 MCG tablet Take 75 mcg by mouth daily before breakfast.  . meloxicam (MOBIC) 15 MG tablet Take 1 tablet (15 mg total) by mouth daily.  Marland Kitchen oxyCODONE-acetaminophen (PERCOCET/ROXICET) 5-325 MG tablet Take 1 tablet by mouth every 6 (six) hours as needed for severe pain.  . tadalafil (CIALIS) 10 MG tablet Take 1 tablet (10 mg total) by mouth every other day as needed for erectile dysfunction.  . tamsulosin (FLOMAX) 0.4 MG CAPS capsule Take 0.4 mg by mouth daily.  Marland Kitchen  venlafaxine (EFFEXOR) 75 MG tablet Take 75 mg by mouth daily.   No facility-administered encounter medications on file as of 04/16/2018.     Allergies as of 04/16/2018 - Review Complete 04/16/2018  Allergen Reaction Noted  . Betadine [povidone iodine] Rash 04/03/2018    Past Medical History:  Diagnosis Date  . Pacemaker   . PVNS (pigmented villonodular synovitis)   . Sleep apnea     No past surgical history on file.  Family History  Problem Relation Age of Onset  . Arthritis Mother   . Depression Mother   . Heart disease Mother   . Arthritis Father   . COPD Father   . Diabetes Father   . Hyperlipidemia Father   . Hypertension Father   . Kidney disease Father     Social History   Socioeconomic History  . Marital status: Single    Spouse name: Not on file  . Number of children: Not on file  . Years of education: Not on file  . Highest education level: Not on file  Occupational History  . Not on file  Social Needs  . Financial resource strain: Not on file  . Food insecurity:    Worry: Not on file    Inability: Not on file  . Transportation needs:    Medical: Not on file    Non-medical: Not on file  Tobacco Use  . Smoking status: Never Smoker  . Smokeless tobacco: Never Used  Substance  and Sexual Activity  . Alcohol use: Not Currently  . Drug use: Yes  . Sexual activity: Yes  Lifestyle  . Physical activity:    Days per week: Not on file    Minutes per session: Not on file  . Stress: Not on file  Relationships  . Social connections:    Talks on phone: Not on file    Gets together: Not on file    Attends religious service: Not on file    Active member of club or organization: Not on file    Attends meetings of clubs or organizations: Not on file    Relationship status: Not on file  . Intimate partner violence:    Fear of current or ex partner: Not on file    Emotionally abused: Not on file    Physically abused: Not on file    Forced sexual activity:  Not on file  Other Topics Concern  . Not on file  Social History Narrative  . Not on file    Review of Systems  Constitutional: Negative.   Respiratory: Positive for apnea.   Cardiovascular: Negative.   Gastrointestinal: Negative.   Musculoskeletal: Negative.   Neurological: Negative.   Psychiatric/Behavioral: Positive for sleep disturbance.    Vitals:   04/16/18 1405  BP: 130/88  Pulse: (!) 59  SpO2: 94%     Physical Exam  Constitutional: Mark Strong is oriented to person, place, and time. Mark Strong appears well-developed and well-nourished.  HENT:  Head: Normocephalic and atraumatic.  Eyes: Pupils are equal, round, and reactive to light. Conjunctivae and EOM are normal. Right eye exhibits no discharge. Left eye exhibits no discharge.  Neck: Normal range of motion. Neck supple. No tracheal deviation present. No thyromegaly present.  Cardiovascular: Normal rate and regular rhythm.  Pulmonary/Chest: Breath sounds normal. No respiratory distress. Mark Strong has no wheezes.  Abdominal: Soft. Bowel sounds are normal. Mark Strong exhibits no distension. There is no tenderness.  Musculoskeletal: Normal range of motion. Mark Strong exhibits no edema or deformity.  Neurological: Mark Strong is alert and oriented to person, place, and time.  Skin: Skin is warm and dry. No erythema.   Data Reviewed: Multiple CT report reviewed-provided by patient  Assessment:   History of moderate obstructive sleep apnea .  Adequately treated at present time with no significant daytime symptoms  Multiple nodules .  Stable on recent CT in July 2019  Plan/Recommendations:  .  Repeat CT in a year to follow-up on the nodules .  Low risk patient  Continue CPAP use on a regular basis  We will see him back in the office in about a year  Encouraged to call with any significant symptoms   Virl Diamond MD [facility] 04/16/2018, 2:33 PM  CC: [name], MD

## 2018-04-16 NOTE — Addendum Note (Signed)
Addended by: Zane Herald R on: 04/16/2018 04:04 PM   Modules accepted: Orders

## 2018-04-16 NOTE — Patient Instructions (Signed)
Multiple lung nodules-stable compared to one a year ago-no new nodules from your recent report Obstructive sleep apnea-improved symptoms  Stable status  Repeat CT in July/August 2020 Obtain a pulmonary function study prior to next visit  I will see you back in the office in a year  Call with any concerns

## 2018-04-17 DIAGNOSIS — M25552 Pain in left hip: Secondary | ICD-10-CM | POA: Insufficient documentation

## 2018-04-18 ENCOUNTER — Other Ambulatory Visit (HOSPITAL_COMMUNITY): Payer: Self-pay | Admitting: Orthopedic Surgery

## 2018-04-18 DIAGNOSIS — M25561 Pain in right knee: Secondary | ICD-10-CM

## 2018-04-18 DIAGNOSIS — M25562 Pain in left knee: Principal | ICD-10-CM

## 2018-04-24 ENCOUNTER — Telehealth: Payer: Self-pay | Admitting: Pulmonary Disease

## 2018-04-24 NOTE — Telephone Encounter (Signed)
Download from machine is suboptimal  Too few days  Need to use it for more hours Avoid supine sleep if possible Elevate head of the bed if possible  Call us, call DME to address any issues that he has noted ready Needs to use the machine nightly

## 2018-04-24 NOTE — Telephone Encounter (Signed)
LMOM X1 

## 2018-04-25 ENCOUNTER — Ambulatory Visit: Payer: Medicare Other | Admitting: Endocrinology

## 2018-04-25 DIAGNOSIS — Z0289 Encounter for other administrative examinations: Secondary | ICD-10-CM

## 2018-04-25 NOTE — Telephone Encounter (Signed)
Called and spoke with patient regarding results.  Informed the patient of results and recommendations today. Pt will call [employer] to f/u with his acct in moving from [place] to [place] Pt verbalized understanding and denied any questions or concerns at this time.  Nothing further needed.

## 2018-04-25 NOTE — Telephone Encounter (Signed)
Patient returned phone call; pt contact # [phone]

## 2018-04-28 ENCOUNTER — Encounter: Payer: Self-pay | Admitting: Endocrinology

## 2018-04-28 ENCOUNTER — Ambulatory Visit: Payer: Medicare Other | Admitting: Endocrinology

## 2018-04-28 DIAGNOSIS — E039 Hypothyroidism, unspecified: Secondary | ICD-10-CM

## 2018-04-28 DIAGNOSIS — E041 Nontoxic single thyroid nodule: Secondary | ICD-10-CM | POA: Diagnosis not present

## 2018-04-28 LAB — TSH: TSH: 0.63 u[IU]/mL (ref 0.35–4.50)

## 2018-04-28 LAB — T4, FREE: Free T4: 0.83 ng/dL (ref 0.60–1.60)

## 2018-04-28 MED ORDER — LEVOTHYROXINE SODIUM 75 MCG PO TABS: 75.0000 ug | ORAL_TABLET | Freq: Every day | ORAL | 3 refills | Status: DC

## 2018-04-28 NOTE — Patient Instructions (Signed)
Let's recheck the ultrasound.  you will receive a phone call, about a day and time for an appointment. Based on the results, you may need to repeat the biopsy. Please continue the same medication.  Please come back for a follow-up appointment in 6 months.

## 2018-04-28 NOTE — Progress Notes (Signed)
Subjective:    Patient ID: Mark Strong, male    DOB: 1971-04-03, 47 y.o.   MRN: [id number]  HPI Pt is referred by Dr Neva Seat, for hypothyroidism.  Pt reports hypothyroidism was dx'ed in 2016.  He has been on prescribed thyroid hormone therapy since then.  He has never taken kelp or any other type of non-prescribed thyroid product.  He has never had thyroid surgery, or XRT to the neck.  He has never been on amiodarone or lithium.  He reports slight weight gain and assoc fatigue.  He has been on current 75 mcg/day, since 1 year.  He had a bx in mid-2019.  He says pathol recommended a repeat, due to inadeq material.   Past Medical History:  Diagnosis Date  . Pacemaker   . PVNS (pigmented villonodular synovitis)   . Sciatica   . Sleep apnea   . Thyroid disease   . Varicose veins of right leg with edema     Past Surgical History:  Procedure Laterality Date  . INSERT / REPLACE / REMOVE PACEMAKER     Biotronik, dual-chamber, 05/18/2016 for SSS  . Right greater saphenous vein ablation Right     Social History   Socioeconomic History  . Marital status: Single    Spouse name: Not on file  . Number of children: Not on file  . Years of education: Not on file  . Highest education level: Not on file  Occupational History  . Not on file  Social Needs  . Financial resource strain: Not on file  . Food insecurity:    Worry: Not on file    Inability: Not on file  . Transportation needs:    Medical: Not on file    Non-medical: Not on file  Tobacco Use  . Smoking status: Never Smoker  . Smokeless tobacco: Never Used  Substance and Sexual Activity  . Alcohol use: Not Currently  . Drug use: Yes  . Sexual activity: Yes  Lifestyle  . Physical activity:    Days per week: Not on file    Minutes per session: Not on file  . Stress: Not on file  Relationships  . Social connections:    Talks on phone: Not on file    Gets together: Not on file    Attends religious service: Not on  file    Active member of club or organization: Not on file    Attends meetings of clubs or organizations: Not on file    Relationship status: Not on file  . Intimate partner violence:    Fear of current or ex partner: Not on file    Emotionally abused: Not on file    Physically abused: Not on file    Forced sexual activity: Not on file  Other Topics Concern  . Not on file  Social History Narrative  . Not on file    Current Outpatient Medications on File Prior to Visit  Medication Sig Dispense Refill  . allopurinol (ZYLOPRIM) 300 MG tablet Take 300 mg by mouth daily.    Marland Kitchen amLODipine (NORVASC) 2.5 MG tablet Take 1.25 mg by mouth daily.    Marland Kitchen atorvastatin (LIPITOR) 10 MG tablet   0  . Docusate Sodium (COLACE PO) Take by mouth.    . gabapentin (NEURONTIN) 600 MG tablet Take 600 mg by mouth 2 (two) times daily.    . meloxicam (MOBIC) 15 MG tablet Take 1 tablet (15 mg total) by mouth daily. 30 tablet 1  .  oxyCODONE-acetaminophen (PERCOCET/ROXICET) 5-325 MG tablet Take 1 tablet by mouth every 6 (six) hours as needed for severe pain. 10 tablet 0  . tadalafil (CIALIS) 10 MG tablet Take 1 tablet (10 mg total) by mouth every other day as needed for erectile dysfunction. 30 tablet 1  . tamsulosin (FLOMAX) 0.4 MG CAPS capsule Take 0.4 mg by mouth daily.    Marland Kitchen venlafaxine (EFFEXOR) 75 MG tablet Take 75 mg by mouth daily.     No current facility-administered medications on file prior to visit.     Allergies  Allergen Reactions  . Betadine [Povidone Iodine] Rash    Family History  Problem Relation Age of Onset  . Arthritis Mother   . Depression Mother   . Heart disease Mother   . Arthritis Father   . COPD Father   . Diabetes Father   . Hyperlipidemia Father   . Hypertension Father   . Kidney disease Father   . Thyroid disease Father     BP 124/60 (BP Location: Right Arm, Patient Position: Sitting, Cuff Size: Normal)   Pulse 62   Ht 6\' 2"  (1.88 m)   Wt 242 lb (109.8 kg)   SpO2 95%    BMI 31.07 kg/m   Review of Systems denies depression, muscle cramps, sob, memory loss, constipation, numbness, blurry vision, cold intolerance, myalgias, rhinorrhea, easy bruising, and syncope.  He has chronic back pain and other arthralgias.  He has constipation.       Objective:   Physical Exam VS: see vs page GEN: no distress HEAD: head: no deformity eyes: no periorbital swelling, no proptosis external nose and ears are normal mouth: no lesion seen NECK: thyroid is slightly enlarged, but I cannot appreciate a nodule.   CHEST WALL: no deformity LUNGS: clear to auscultation CV: reg rate and rhythm, no murmur ABD: abdomen is soft, nontender.  no hepatosplenomegaly.  not distended.  no hernia MUSCULOSKELETAL: muscle bulk and strength are grossly normal.  no obvious joint swelling.  gait is normal and steady EXTEMITIES: no deformity.  no edema PULSES: no carotid bruit NEURO:  cn 2-12 grossly intact.   readily moves all 4's.  sensation is intact to touch on all 4's SKIN:  Normal texture and temperature.  No rash or suspicious lesion is visible.   NODES:  None palpable at the neck PSYCH: alert, well-oriented.  Does not appear anxious nor depressed.  outside test results are reviewed: [place] (2019): right thyroid nodule "up to 17 mm diameter."  New since 2018 TSH=1.4  I have reviewed outside records, and summarized: Pt was noted to have thyroid nodule, and referred here.  Chronic pain and ED were also addressed      Assessment & Plan:  Thyroid nodule.  Plan will be to repeat US prior to repeat bx Hypothyroidism (? related to the nodule): recheck today  Patient Instructions  Let's recheck the ultrasound.  you will receive a phone call, about a day and time for an appointment. Based on the results, you may need to repeat the biopsy. Please continue the same medication.  Please come back for a follow-up appointment in 6 months.

## 2018-04-30 ENCOUNTER — Ambulatory Visit: Payer: Medicare Other | Admitting: Internal Medicine

## 2018-04-30 ENCOUNTER — Encounter: Payer: Self-pay | Admitting: Internal Medicine

## 2018-04-30 VITALS — BP 122/82 | HR 116 | Ht 74.0 in | Wt 240.0 lb

## 2018-04-30 DIAGNOSIS — Z9989 Dependence on other enabling machines and devices: Secondary | ICD-10-CM | POA: Diagnosis not present

## 2018-04-30 DIAGNOSIS — G4733 Obstructive sleep apnea (adult) (pediatric): Secondary | ICD-10-CM | POA: Diagnosis not present

## 2018-04-30 DIAGNOSIS — Z950 Presence of cardiac pacemaker: Secondary | ICD-10-CM

## 2018-04-30 NOTE — Progress Notes (Signed)
OFFICE NOTE  Chief Complaint:  Establish cardiologist  Primary Care Physician: [name], MD  HPI:  Mark Strong is a 47 y.o. male with a past medial history significant for sinus node dysfunction with chronotropic incompetence status post Biotronik dual-chamber pacemaker on May 18, 2016 in [place].  He also has a history of sleep apnea, arthritis and chronic pain and is followed in a local pain clinic.  He recently moved to [facility] from [place].  He does have family in the area.  He was previously getting care by Dr. Cannon Kettle in [place].  I did receive records that were brought in by the patient today.  His last office visit indicated that the patient was doing well.  As mentioned he had a dual-chamber pacemaker placed for symptomatic sick sinus syndrome at age 48.  Prior to that he had had heart catheterization which showed normal coronary arteries.  He is also had prior endovenous laser ablation of the right greater saphenous vein and some stab phlebectomy for peripheral venous disease.  He has chronic pain and is disabled partially because of that.  He also was followed by rheumatology and is hypothyroid followed by endocrine.  His last pacemaker check was in June 2019.  PMHx:  Past Medical History:  Diagnosis Date  . Pacemaker   . PVNS (pigmented villonodular synovitis)   . Sciatica   . Sleep apnea   . Thyroid disease   . Varicose veins of right leg with edema     Past Surgical History:  Procedure Laterality Date  . INSERT / REPLACE / REMOVE PACEMAKER     Biotronik, dual-chamber, 05/18/2016 for SSS  . Right greater saphenous vein ablation Right     FAMHx:  Family History  Problem Relation Age of Onset  . Arthritis Mother   . Depression Mother   . Heart disease Mother   . Arthritis Father   . COPD Father   . Diabetes Father   . Hyperlipidemia Father   . Hypertension Father   . Kidney disease Father   . Thyroid disease  Father     SOCHx:   reports that he has never smoked. He has never used smokeless tobacco. He reports that he drank alcohol. He reports that he has current or past drug history.  ALLERGIES:  Allergies  Allergen Reactions  . Betadine [Povidone Iodine] Rash    ROS: Pertinent items noted in HPI and remainder of comprehensive ROS otherwise negative.  HOME MEDS: Current Outpatient Medications on File Prior to Visit  Medication Sig Dispense Refill  . allopurinol (ZYLOPRIM) 300 MG tablet Take 300 mg by mouth daily.    Marland Kitchen amLODipine (NORVASC) 2.5 MG tablet Take 1.25 mg by mouth daily.    Marland Kitchen atorvastatin (LIPITOR) 10 MG tablet   0  . Docusate Sodium (COLACE PO) Take by mouth.    . gabapentin (NEURONTIN) 600 MG tablet Take 600 mg by mouth 2 (two) times daily.    Marland Kitchen levothyroxine (SYNTHROID) 75 MCG tablet Take 1 tablet (75 mcg total) by mouth daily before breakfast. 90 tablet 3  . meloxicam (MOBIC) 15 MG tablet Take 1 tablet (15 mg total) by mouth daily. 30 tablet 1  . oxyCODONE-acetaminophen (PERCOCET/ROXICET) 5-325 MG tablet Take 1 tablet by mouth every 6 (six) hours as needed for severe pain. 10 tablet 0  . tadalafil (CIALIS) 10 MG tablet Take 1 tablet (10 mg total) by mouth every other day as needed for erectile  dysfunction. 30 tablet 1  . tamsulosin (FLOMAX) 0.4 MG CAPS capsule Take 0.4 mg by mouth daily.    Marland Kitchen. venlafaxine (EFFEXOR) 75 MG tablet Take 75 mg by mouth daily.     No current facility-administered medications on file prior to visit.     LABS/IMAGING: No results found for this or any previous visit (from the past 48 hour(s)). No results found.  LIPID PANEL: No results found for: CHOL, TRIG, HDL, CHOLHDL, VLDL, LDLCALC, LDLDIRECT   WEIGHTS: Wt Readings from Last 3 Encounters:  04/30/18 240 lb (108.9 kg)  04/28/18 242 lb (109.8 kg)  04/14/18 244 lb 3.2 oz (110.8 kg)    VITALS: BP 122/82   Pulse (!) 116   Ht 6\' 2"  (1.88 m)   Wt 240 lb (108.9 kg)   BMI 30.81 kg/m    EXAM: General appearance: alert, no distress and moderately obese Neck: no carotid bruit, no JVD and thyroid not enlarged, symmetric, no tenderness/mass/nodules Lungs: clear to auscultation bilaterally Heart: regular rate and rhythm Abdomen: soft, non-tender; bowel sounds normal; no masses,  no organomegaly Extremities: extremities normal, atraumatic, no cyanosis or edema Pulses: 2+ and symmetric Skin: Skin color, texture, turgor normal. No rashes or lesions Neurologic: Grossly normal Psych: Pleasant  EKG: AV paced rhythm with significant ectopy- personally reviewed  ASSESSMENT: 1. History of SSS status post Biotronik pacemaker (05/2016) 2. No coronary artery disease by cardiac catheterization in 03/2015, normal LVEF 55% 3. Chronic dyspnea 4. Chronic pain 5. Hypothyroidism 6. OSA on CPAP  PLAN: 1.   Mark Strong has a history of sick sinus syndrome status post Biotronik pacemaker.  He had no coronary disease by heart cath in October 2016 with normal LV function.  He has a history of sleep apnea on CPAP and chronic pain which is managed in a pain clinic.  He essentially needs ongoing care in our device clinic and may not need further follow-up with me.  We will plan to schedule follow-up with Dr. Salena Saner and arrange for remote monitoring of his device.  Follow-up 6 months.  Chrystie NoseKenneth C. Hilty, MD, Sioux Falls Va Medical CenterFACC, FACP  [place]  [facility]  Medical Director of the Advanced Lipid Disorders &  Cardiovascular Risk Reduction Clinic Diplomate of the American Board of Clinical Lipidology Attending Cardiologist  Direct Dial: [phone]  Fax: [phone]  Website:  [email or web address]   Kenneth C Hilty 04/30/2018, 12:35 PM

## 2018-04-30 NOTE — Patient Instructions (Signed)
Medication Instructions:  Continue current medications If you need a refill on your cardiac medications before your next appointment, please call your pharmacy.   Testing/Procedures: Dr. Rennis GoldenHilty has requested that you establish with our device clinic. Someone should call you to set up your remote check of your device from home with our clinic.   Follow-Up: [facility], you and your health needs are our priority.  As part of our continuing mission to provide you with exceptional heart care, we have created designated Provider Care Teams.  These Care Teams include your primary Cardiologist (physician) and Advanced Practice Providers (APPs -  Physician Assistants and Nurse Practitioners) who all work together to provide you with the care you need, when you need it. You will need a follow up appointment in 6 months.  Please call our office 2 months in advance to schedule this appointment.  You may see Dr. Rennis GoldenHilty or one of the following Advanced Practice Providers on your designated Care Team: Azalee CourseHao Meng, [place] JerseyPA-C . Micah FlesherAngela Duke, PA-C  Dr. Rennis GoldenHilty recommends that you schedule an appointment with Dr. Thurmon FairMihai [name] to follow your pacemaker.   Any Other Special Instructions Will Be Listed Below (If Applicable).

## 2018-05-01 ENCOUNTER — Ambulatory Visit: Payer: Medicare Other | Admitting: Podiatry

## 2018-05-01 ENCOUNTER — Encounter: Payer: Self-pay | Admitting: Podiatry

## 2018-05-01 DIAGNOSIS — M779 Enthesopathy, unspecified: Secondary | ICD-10-CM | POA: Diagnosis not present

## 2018-05-01 DIAGNOSIS — M778 Other enthesopathies, not elsewhere classified: Secondary | ICD-10-CM

## 2018-05-04 NOTE — Progress Notes (Signed)
He presents today stating that my right foot is hurting me around the top and across the side of the foot.  He was last seen for plantar fasciitis.  And his nail biopsy he is requesting today.  Objective: Vital signs are stable he is alert and oriented x3.  Biopsy is not in yet.  He has pain on palpation of the top portion of the right foot and pain along the heel.  Assessment: Plantar fasciitis.  No dystrophy.  Plan: After sterile Betadine skin prep I injected 20 mg Kenalog 5 mg Marcaine point maximal tenderness of his right heel.  We will follow-up with nail biopsy next visit hopefully it will be back by then.

## 2018-05-08 ENCOUNTER — Ambulatory Visit (HOSPITAL_COMMUNITY)
Admission: RE | Admit: 2018-05-08 | Discharge: 2018-05-08 | Disposition: A | Discharge status: Home / Self Care | Payer: Medicare Other | Source: Ambulatory Visit | Attending: Orthopedic Surgery | Admitting: Orthopedic Surgery

## 2018-05-08 ENCOUNTER — Encounter (HOSPITAL_COMMUNITY): Payer: Self-pay | Admitting: Radiology

## 2018-05-08 DIAGNOSIS — M25561 Pain in right knee: Secondary | ICD-10-CM | POA: Insufficient documentation

## 2018-05-08 DIAGNOSIS — M25562 Pain in left knee: Secondary | ICD-10-CM | POA: Diagnosis present

## 2018-05-09 ENCOUNTER — Telehealth: Payer: Self-pay | Admitting: Family Medicine

## 2018-05-09 DIAGNOSIS — F411 Generalized anxiety disorder: Secondary | ICD-10-CM

## 2018-05-09 NOTE — Telephone Encounter (Signed)
Patient is requesting Effexor / Order Providers   Documenting Provider Encounter Provider  [provider] Shade FloodGreene, Jeffrey R, MD  Outpatient Medication Detail    Disp Refills Start End   venlafaxine (EFFEXOR) 75 MG tablet       Sig - Route: Take 75 mg by mouth daily. - Oral   Class: Historical Med    / LOV with Dr. Chilton SiGreen on 04/14/18 /

## 2018-05-09 NOTE — Telephone Encounter (Signed)
Copied from CRM [id number]. Topic: Quick Communication - Rx Refill/Question >> May 09, 2018 11:18 AM Lynne LoganHudson, Caryn D wrote: Medication: venlafaxine (EFFEXOR) 75 MG tablet / Neva SeatGreene has not filled before. Historical provider  Has the patient contacted their pharmacy? No (Agent: If no, request that the patient contact the pharmacy for the refill.) (Agent: If yes, when and what did the pharmacy advise?)  Preferred Pharmacy (with phone number or street name): [id number] - [place], [place] - [address] [facility] [address] [facility] [address] [facility] [address] [phone] (Phone) [phone] (Fax)    Agent: Please be advised that RX refills may take up to 3 business days. We ask that you follow-up with your pharmacy.

## 2018-05-12 MED ORDER — VENLAFAXINE HCL 75 MG PO TABS: 75.0000 mg | ORAL_TABLET | Freq: Every day | ORAL | 1 refills | Status: DC

## 2018-05-12 NOTE — Addendum Note (Signed)
Addended by: Neva SeatGREENE, Christalynn [place] R on: 05/12/2018 11:06 AM   Modules accepted: Orders

## 2018-05-20 ENCOUNTER — Ambulatory Visit
Admission: RE | Admit: 2018-05-20 | Discharge: 2018-05-20 | Disposition: A | Discharge status: Home / Self Care | Payer: Medicare Other | Source: Ambulatory Visit | Attending: Endocrinology | Admitting: Endocrinology

## 2018-05-20 DIAGNOSIS — E041 Nontoxic single thyroid nodule: Secondary | ICD-10-CM

## 2018-05-20 DIAGNOSIS — E039 Hypothyroidism, unspecified: Secondary | ICD-10-CM

## 2018-05-22 ENCOUNTER — Encounter: Payer: Self-pay | Admitting: Podiatry

## 2018-05-22 ENCOUNTER — Ambulatory Visit: Payer: Medicare Other | Admitting: Podiatry

## 2018-05-22 DIAGNOSIS — M722 Plantar fascial fibromatosis: Secondary | ICD-10-CM | POA: Diagnosis not present

## 2018-05-22 DIAGNOSIS — Z79899 Other long term (current) drug therapy: Secondary | ICD-10-CM

## 2018-05-22 DIAGNOSIS — L603 Nail dystrophy: Secondary | ICD-10-CM | POA: Diagnosis not present

## 2018-05-22 MED ORDER — TERBINAFINE HCL 250 MG PO TABS: 250.0000 mg | ORAL_TABLET | Freq: Every day | ORAL | 0 refills | Status: DC

## 2018-05-22 NOTE — Progress Notes (Signed)
Follow-up of the bilateral heels today the right one is better the left foot still hurts in the arch.  Also here for follow-up of his pathology.  Objective: Vital signs are stable alert and oriented x3 pain on palpation medial calcaneal tubercle.  Pathology result results do demonstrate nail dystrophy with onychomycosis.  Assessment: Nail dystrophy plantar fasciitis.  Plan: Start him on Lamisil 250 mg tablets 1 p.o. nightly.  Also reinjected the heel today 20 mg Kenalog 5 mg Marcaine point maximal tenderness.  Tolerated procedure well follow-up with me in 1 month also requested blood work should this

## 2018-05-22 NOTE — Patient Instructions (Signed)

## 2018-05-23 DIAGNOSIS — M94262 Chondromalacia, left knee: Secondary | ICD-10-CM | POA: Insufficient documentation

## 2018-05-23 DIAGNOSIS — M2241 Chondromalacia patellae, right knee: Secondary | ICD-10-CM | POA: Insufficient documentation

## 2018-05-23 LAB — HEPATIC FUNCTION PANEL
AG Ratio: 1.9 (calc) (ref 1.0–2.5)
ALT: 18 U/L (ref 9–46)
AST: 22 U/L (ref 10–40)
Albumin: 4.7 g/dL (ref 3.6–5.1)
Alkaline phosphatase (APISO): 42 U/L (ref 40–115)
Bilirubin, Direct: 0.1 mg/dL (ref 0.0–0.2)
Globulin: 2.5 g/dL (calc) (ref 1.9–3.7)
Indirect Bilirubin: 0.5 mg/dL (calc) (ref 0.2–1.2)
Total Bilirubin: 0.6 mg/dL (ref 0.2–1.2)
Total Protein: 7.2 g/dL (ref 6.1–8.1)

## 2018-05-27 ENCOUNTER — Telehealth: Payer: Self-pay | Admitting: *Deleted

## 2018-05-27 MED ORDER — TERBINAFINE HCL 250 MG PO TABS: 250.0000 mg | ORAL_TABLET | Freq: Every day | ORAL | 0 refills | Status: DC

## 2018-05-27 NOTE — Telephone Encounter (Signed)
Left message informing pt of Dr. Hyatt's review of results and orders. 

## 2018-05-27 NOTE — Telephone Encounter (Signed)
-----   Message from Elinor ParkinsonMax T Hyatt, North DakotaDPM sent at 05/26/2018  7:02 AM EST ----- Blood work looks perfect and may continue medication.

## 2018-05-28 ENCOUNTER — Encounter: Payer: Self-pay | Admitting: Cardiovascular Disease

## 2018-05-28 ENCOUNTER — Ambulatory Visit (INDEPENDENT_AMBULATORY_CARE_PROVIDER_SITE_OTHER): Payer: Medicare Other | Admitting: Cardiovascular Disease

## 2018-05-28 VITALS — BP 112/74 | HR 59 | Ht 74.0 in | Wt 229.0 lb

## 2018-05-28 DIAGNOSIS — Z950 Presence of cardiac pacemaker: Secondary | ICD-10-CM | POA: Insufficient documentation

## 2018-05-28 DIAGNOSIS — I495 Sick sinus syndrome: Secondary | ICD-10-CM

## 2018-05-28 DIAGNOSIS — R001 Bradycardia, unspecified: Secondary | ICD-10-CM

## 2018-05-28 DIAGNOSIS — G4733 Obstructive sleep apnea (adult) (pediatric): Secondary | ICD-10-CM | POA: Diagnosis not present

## 2018-05-28 NOTE — Progress Notes (Signed)
Cardiology Office Note:    Date:  05/28/2018   ID:  Mark Strong, DOB 12/24/1970, MRN [id number]  PCP:  [name], MD  Cardiologist:  No primary care provider on file.  Electrophysiologist:  None   Referring MD: [name], MD   Chief Complaint  Patient presents with  . Follow-up    Pacemaker  . Shortness of Breath    on exertion    History of Present Illness:    Mark Strong is a 47 y.o. 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  She received a dual-chamber permanent pacemaker in 2017 in [place].  He has since relocated to [facility] and saw Dr. Rennis Golden once earlier this year.  He is here to establish pacemaker follow-up.  Mark Strong has been trying to lose weight and is exercising more.  He has no difficulty when running on the treadmill, but notices more shortness of breath when he rides the bicycle.  He reports that when on the treadmill his heart rate frequently reaches about 120 bpm, but is not sure how his heart rate behaves while riding the bicycle.  He does not have any other cardiovascular complaints and specifically denies syncope, dizziness, palpitations, orthopnea, PND, chest pain at rest or with activity, or claudication.  He has problems with lumbar and cervical spine disease and sciatica.  These limit his activity more than his breathing does as a general role.  His dual-chamber Biotronik E. Luna 8 pacemaker was implanted in 2017.  Even though he has a CLS device, his current sensor settings are based exclusively of the accelerometer.  The heart rate distribution is very blunted.  Atrial pacing is seen almost exclusively at the lower rate limit, although his native heart rate occasionally reaches the 100-120 range.  He has 84% atrial pacing and only 1% ventricular pacing.  He has not had any episodes of ventricular tachycardia or atrial mode switch.  He does not take any medications  with negative chronotropic activity.    He takes a very low dose of amlodipine for hypertension and his blood pressure is very well controlled.  He is on a statin. Uses Cialis for ED.  Compliant with CPAP for sleep apnea.  Has chronic pain and receives chronic buprenorphine.  Supplements for hypothyroidism and allopurinol for hyperuricemia.  Prior to pacemaker implantation he had normal coronary angiography in 2016 and EF 55%.  He has a history of endovenous laser ablation of the right greater saphenous.  Past Medical History:  Diagnosis Date  . Pacemaker   . PVNS (pigmented villonodular synovitis)   . Sciatica   . Sleep apnea   . Thyroid disease   . Varicose veins of right leg with edema     Past Surgical History:  Procedure Laterality Date  . INSERT / REPLACE / REMOVE PACEMAKER     Biotronik, dual-chamber, 05/18/2016 for SSS  . Right greater saphenous vein ablation Right     Current Medications: Current Meds  Medication Sig  . allopurinol (ZYLOPRIM) 300 MG tablet Take 300 mg by mouth daily.  Marland Kitchen amLODipine (NORVASC) 2.5 MG tablet Take 1.25 mg by mouth daily.  Marland Kitchen atorvastatin (LIPITOR) 10 MG tablet   . BELBUCA 75 MCG FILM Take 1 Film by mouth 2 (two) times daily.  Tery Sanfilippo Sodium (COLACE PO) Take by mouth.  . gabapentin (NEURONTIN) 600 MG tablet Take 600 mg by mouth 2 (two) times daily.  Marland Kitchen levothyroxine (SYNTHROID) 75  MCG tablet Take 1 tablet (75 mcg total) by mouth daily before breakfast.  . meloxicam (MOBIC) 15 MG tablet Take 1 tablet (15 mg total) by mouth daily.  . tadalafil (CIALIS) 10 MG tablet Take 1 tablet (10 mg total) by mouth every other day as needed for erectile dysfunction.  . tamsulosin (FLOMAX) 0.4 MG CAPS capsule Take 0.4 mg by mouth daily.  Marland Kitchen terbinafine (LAMISIL) 250 MG tablet Take 1 tablet (250 mg total) by mouth daily.  Marland Kitchen venlafaxine (EFFEXOR) 75 MG tablet Take 1 tablet (75 mg total) by mouth daily.     Allergies:   Betadine [povidone iodine]   Social  History   Socioeconomic History  . Marital status: Single    Spouse name: Not on file  . Number of children: Not on file  . Years of education: Not on file  . Highest education level: Not on file  Occupational History  . Not on file  Social Needs  . Financial resource strain: Not on file  . Food insecurity:    Worry: Not on file    Inability: Not on file  . Transportation needs:    Medical: Not on file    Non-medical: Not on file  Tobacco Use  . Smoking status: Never Smoker  . Smokeless tobacco: Never Used  Substance and Sexual Activity  . Alcohol use: Not Currently  . Drug use: Yes  . Sexual activity: Yes  Lifestyle  . Physical activity:    Days per week: Not on file    Minutes per session: Not on file  . Stress: Not on file  Relationships  . Social connections:    Talks on phone: Not on file    Gets together: Not on file    Attends religious service: Not on file    Active member of club or organization: Not on file    Attends meetings of clubs or organizations: Not on file    Relationship status: Not on file  Other Topics Concern  . Not on file  Social History Narrative  . Not on file     Family History: The patient's family history includes Arthritis in his father and mother; COPD in his father; Depression in his mother; Diabetes in his father; Heart disease in his mother; Hyperlipidemia in his father; Hypertension in his father; Kidney disease in his father; Thyroid disease in his father.  ROS:   Please see the history of present illness.     All other systems reviewed and are negative.  EKGs/Labs/Other Studies Reviewed:    The following studies were reviewed today: Notes from Dr. Rennis Golden and Dr. Chilton Si  EKG:  EKG is not ordered today.  The ekg from April 30, 2018 showed paced, ventricular sensed rhythm, but was a very poor quality tracing and hard to interpret beyond rhythm diagnosis  Recent Labs: 04/28/2018: TSH 0.63 05/22/2018: ALT 18  Recent Lipid  Panel No results found for: CHOL, TRIG, HDL, CHOLHDL, VLDL, LDLCALC, LDLDIRECT  Physical Exam:    VS:  BP 112/74   Pulse (!) 59   Ht 6\' 2"  (1.88 m)   Wt 229 lb (103.9 kg)   BMI 29.40 kg/m     Wt Readings from Last 3 Encounters:  05/28/18 229 lb (103.9 kg)  04/30/18 240 lb (108.9 kg)  04/28/18 242 lb (109.8 kg)     GEN: Overweight, well nourished, well developed in no acute distress HEENT: Normal NECK: No JVD; No carotid bruits LYMPHATICS: No lymphadenopathy CARDIAC: RRR, no  murmurs, rubs, gallops .  Well-healed left subclavian pacemaker site RESPIRATORY:  Clear to auscultation without rales, wheezing or rhonchi  ABDOMEN: Soft, non-tender, non-distended MUSCULOSKELETAL:  No edema; No deformity  SKIN: Warm and dry NEUROLOGIC:  Alert and oriented x 3 PSYCHIATRIC:  Normal affect   ASSESSMENT:    1. Chronotropic incompetence with sinus node dysfunction (HCC)   2. Pacemaker   3. OSA (obstructive sleep apnea)    PLAN:    In order of problems listed above:  1. SSS: Has prominent evidence of chronotropic incompetence despite the pacemaker.  The accelerometer does not seem to work well for him.  This may be a particular problem when he is riding a bicycle rather than running.   2. Pacemaker: We will switch to CLS based rate response.  Check remote downloads every 3 months and office visit yearly, but may bring him back sooner for further sensor adjustment. 3. OSA: Reports compliance with treatment and benefit from CPAP.   Medication Adjustments/Labs and Tests Ordered: Current medicines are reviewed at length with the patient today.  Concerns regarding medicines are outlined above.  No orders of the defined types were placed in this encounter.  No orders of the defined types were placed in this encounter.   Patient Instructions  Medication Instructions:  Dr Royann Shiversroitoru recommends that you continue on your current medications as directed. Please refer to the Current Medication  list given to you today.  If you need a refill on your cardiac medications before your next appointment, please call your pharmacy.   Follow-Up: Your physician recommends that you schedule a follow-up appointment in 3 months with a pacemaker check.     Signed, Thurmon FairMihai Kristy Schomburg, MD  05/28/2018 1:22 PM    [facility]

## 2018-05-28 NOTE — Patient Instructions (Signed)
Medication Instructions:  Dr Royann Shiversroitoru recommends that you continue on your current medications as directed. Please refer to the Current Medication list given to you today.  If you need a refill on your cardiac medications before your next appointment, please call your pharmacy.   Follow-Up: Your physician recommends that you schedule a follow-up appointment in 3 months with a pacemaker check.

## 2018-06-10 ENCOUNTER — Telehealth: Payer: Self-pay | Admitting: Pulmonary Disease

## 2018-06-10 NOTE — Telephone Encounter (Signed)
Mark Strong, Mark A, MD  Sylvester HarderMoore, Heather R, LPN        Call to update patient   CT scan results reviewed  Plan remains the same, repeating CT prior to next visit    Called and spoke to pt and relayed above message.  Pt states that at last OV, it was mentioned that cpap settings would be adjusted.  It does not appear that order was placed to do so.   AO please advise on settings, as I do not see it mentioned within last ov note.

## 2018-06-10 NOTE — Telephone Encounter (Signed)
I do not see where I mentioned adjusting CPAP as well. Patient noted to be compliant without significant symptoms.  Patient on Autotitrating CPAP- will continue the same A download from the machine can be obtained to make sure treatment is optimal. Will be glad to see him sooner if he is having any issues

## 2018-06-12 NOTE — Telephone Encounter (Signed)
Called and spoke with patient letting him know we would need another download before changing settings he states he [place] get [place] nothing further need at this time until patient sends download.

## 2018-06-12 NOTE — Telephone Encounter (Signed)
Patient returned call, CB is [phone].

## 2018-06-12 NOTE — Telephone Encounter (Signed)
lmom 

## 2018-06-26 ENCOUNTER — Encounter: Payer: Self-pay | Admitting: Podiatry

## 2018-06-26 ENCOUNTER — Ambulatory Visit: Payer: Medicare Other | Admitting: Podiatry

## 2018-06-26 DIAGNOSIS — Z79899 Other long term (current) drug therapy: Secondary | ICD-10-CM | POA: Diagnosis not present

## 2018-06-26 DIAGNOSIS — M722 Plantar fascial fibromatosis: Secondary | ICD-10-CM

## 2018-06-26 DIAGNOSIS — L603 Nail dystrophy: Secondary | ICD-10-CM | POA: Diagnosis not present

## 2018-06-26 LAB — HEPATIC FUNCTION PANEL
AG Ratio: 1.8 (calc) (ref 1.0–2.5)
ALT: 13 U/L (ref 9–46)
AST: 16 U/L (ref 10–40)
Albumin: 4.3 g/dL (ref 3.6–5.1)
Alkaline phosphatase (APISO): 43 U/L (ref 40–115)
Bilirubin, Direct: 0.1 mg/dL (ref 0.0–0.2)
Globulin: 2.4 g/dL (calc) (ref 1.9–3.7)
Indirect Bilirubin: 0.5 mg/dL (calc) (ref 0.2–1.2)
Total Bilirubin: 0.6 mg/dL (ref 0.2–1.2)
Total Protein: 6.7 g/dL (ref 6.1–8.1)

## 2018-06-26 NOTE — Progress Notes (Signed)
He presents today for follow-up of plantar fasciitis states that he is 100% better after the injections and his weight loss.  He is here today for follow-up of the onychomycosis.  States that he does not see a whole lot of change in the nail as of yet.  He denies any fever chills nausea vomiting muscle aches pains rashes or itching.  Objective: No reproducible pain on palpation medial calcaneal tubercles bilateral.  No change in nail plate as of yet.  Assessment: Well-healing plantar fasciitis long-term therapy for onychomycosis hallux left.  Plantar fasciitis has resolved 100%.  Plan: Discussed etiology pathology and surgical therapies at this point continue the use of Lamisil requesting another liver profile.

## 2018-06-30 ENCOUNTER — Telehealth: Payer: Self-pay | Admitting: *Deleted

## 2018-06-30 NOTE — Telephone Encounter (Signed)
Left message informing pt of Dr. Hyatt's review of results and orders. 

## 2018-06-30 NOTE — Telephone Encounter (Signed)
-----   Message from Elinor Parkinson, [place] sent at 06/30/2018  7:10 AM EST ----- Blood work looks perfect and may continue medication.

## 2018-07-08 LAB — CUP PACEART INCLINIC DEVICE CHECK
Date Time Interrogation Session: 20200121152253
Implantable Lead Implant Date: 20171217
Implantable Lead Implant Date: 20171217
Implantable Lead Location: 753859
Implantable Lead Location: 753860
Implantable Lead Model: 377
Implantable Lead Model: 377
Implantable Lead Serial Number: 49336100
Implantable Lead Serial Number: 49685190
Implantable Pulse Generator Implant Date: 20171217
Pulse Gen Model: 394929
Pulse Gen Serial Number: 68775417

## 2018-07-15 ENCOUNTER — Encounter: Payer: Self-pay | Admitting: Family Medicine

## 2018-07-15 ENCOUNTER — Ambulatory Visit: Payer: Medicare Other | Admitting: Family Medicine

## 2018-07-15 ENCOUNTER — Other Ambulatory Visit: Payer: Self-pay

## 2018-07-15 VITALS — BP 106/71 | HR 110 | Temp 98.9°F | Resp 14 | Ht 74.0 in | Wt 208.2 lb

## 2018-07-15 DIAGNOSIS — F419 Anxiety disorder, unspecified: Secondary | ICD-10-CM

## 2018-07-15 DIAGNOSIS — E785 Hyperlipidemia, unspecified: Secondary | ICD-10-CM | POA: Diagnosis not present

## 2018-07-15 DIAGNOSIS — F411 Generalized anxiety disorder: Secondary | ICD-10-CM

## 2018-07-15 MED ORDER — VENLAFAXINE HCL 75 MG PO TABS: 75.0000 mg | ORAL_TABLET | Freq: Two times a day (BID) | ORAL | 1 refills | Status: DC

## 2018-07-15 MED ORDER — VENLAFAXINE HCL 50 MG PO TABS: 50.0000 mg | ORAL_TABLET | Freq: Two times a day (BID) | ORAL | 1 refills | Status: DC

## 2018-07-15 NOTE — Patient Instructions (Addendum)
We can try higher dose of Effexor and recheck in next 6 weeks. See other info on anxiety below.   Return to the clinic or go to the nearest emergency room if any of your symptoms worsen or new symptoms occur.   If you have lab work done today you will be contacted with your lab results within the next 2 weeks.  If you have not heard from [place] then please contact us. The fastest way to get your results is to register for My Chart.   IF you received an x-ray today, you will receive an invoice from [facility]. Please contact [facility] at [phone] with questions or concerns regarding your invoice.   IF you received labwork today, you will receive an invoice from [place]. Please contact [facility] at [phone] with questions or concerns regarding your invoice.   Our billing staff will not be able to assist you with questions regarding bills from these companies.  You will be contacted with the lab results as soon as they are available. The fastest way to get your results is to activate your My Chart account. Instructions are located on the last page of this paperwork. If you have not heard from [place] regarding the results in 2 weeks, please contact this office.

## 2018-08-11 ENCOUNTER — Telehealth: Payer: Self-pay | Admitting: Family Medicine

## 2018-08-11 ENCOUNTER — Encounter: Payer: Self-pay | Admitting: Family Medicine

## 2018-08-11 DIAGNOSIS — H9190 Unspecified hearing loss, unspecified ear: Secondary | ICD-10-CM

## 2018-08-11 DIAGNOSIS — H9313 Tinnitus, bilateral: Secondary | ICD-10-CM

## 2018-08-11 NOTE — Telephone Encounter (Signed)
Please review I am not showing a referral for ENT in January   Thank you    Copied from CRM [id number]. Topic: Referral - Request for Referral >> Aug 08, 2018  1:40 PM Crist Infante wrote: Pt sates at last visit 07/15/2018, Dr Chilton Si was going to refer him to an ENT for issues with ears.  Pt called to see if that had been done and who it is.

## 2018-08-11 NOTE — Progress Notes (Signed)
Subjective:    Patient ID: Mark Strong, male    DOB: [birth year], 48 y.o.   MRN: [id number]  HPI *note recreated - prior note erased - "notewriter selections unable to be saved".   Mark Strong is a 48 y.o. male Presents today for: Chief Complaint  Patient presents with  . Follow-up    here to f/u chronic med.  Was seen by Pain mang had lumbar facet inj and lumbar radiofrequency ablation   Anxiety: Some increased symptoms - would like to try higher dose of effexor. Followed by multiple specialists including pain management at this time. Had lumbar facet injection and RF ablation for back pain.  Depression screen [facility] 2/9 07/15/2018 03/14/2018  Decreased Interest 0 1  Down, Depressed, Hopeless 0 1  PHQ - 2 Score 0 2  Altered sleeping - 1  Tired, decreased energy - 0  Change in appetite - 0  Feeling bad or failure about yourself  - 0  Trouble concentrating - 0  Moving slowly or fidgety/restless - 0  Suicidal thoughts - 0  PHQ-9 Score - 3  Difficult doing work/chores - Not difficult at all   Hyperlipidemia:  No results found for: CHOL, HDL, LDLCALC, LDLDIRECT, TRIG, CHOLHDL Lab Results  Component Value Date   ALT 13 06/26/2018   AST 16 06/26/2018   BILITOT 0.6 06/26/2018  tolerating Lipitor  qd.     History of pigmented villonodular synovitis, chronic back pain, now followed by pain management.  Ultimately was able to have Belbuca authorized. lumbar RFA and injection helped back. Going to PT for neck and back.   History of osteoarthritis, bilateral knee pain, has been evaluated by Dr. Charlann Boxer with [facility], MRI of the left knee report provided with mild partial-thickness cartilage loss of medial patellofemoral compartment and PVNS likely at the inferior margin of the patellar apex. Planned for appt in 1 year to rechck knees. Using knee and back brace.   History of gout, previously referred to rheumatology, [facility]. Dr. Deanne Coffer  Has  been treated with allopurinol previously, Mobic as needed. bloodwork ok - not on allopurinol. Plan on repeat eval in March.   Pulm: Dr. Aldean Ast. On CPAP, hx of nodules on CT. Plan repeat eval in July or Auguast after repeat CT.   Followed by Dr. Everardo All with endocrinology for hypothyroidism and thyroid nodule. Next appt may 12th. Thyroid nodules reportedly stable.   Podiatry - on terbinafine for onychomycosis, and has treated for plantar fasciitis. lfts are being monitored by podiatry.   Has not seen urology yet - referred last year for BPH. Has taken flomax daily. Doing ok. 1-2 nocturia at the most. No new side effects.   ED: Taking Cialis on occasion. No daily use. No new side effects, rare use. Has some left at this point,    Cardiologist Dr.[name] for chronotropic incompetence with sinus node dysfunction.  Last seen December 11.  Pacemaker from 2017 in [place].  Low-dose amlodipine for hypertension.  CPAP for sleep apnea. Dr. Rennis Golden -cardiology. Pacemaker check in March.    Anxiety: Takes Effexor 75 mg daily.on same dose for 6-7 months. Started on 37.5mg . feeling more anxious recently - would like to try higher dose. Certain triggers. Pristiq caused HA's, but not on Effexor. No SI/HI.   No flowsheet data found.   Depression screen [facility] 2/9 07/15/2018 03/14/2018  Decreased Interest 0 1  Down, Depressed, Hopeless 0 1  PHQ - 2 Score 0 2  Altered sleeping - 1  Tired, decreased energy - 0  Change in appetite - 0  Feeling bad or failure about yourself  - 0  Trouble concentrating - 0  Moving slowly or fidgety/restless - 0  Suicidal thoughts - 0  PHQ-9 Score - 3  Difficult doing work/chores - Not difficult at all   Hyperlipidemia:  No results found for: CHOL, HDL, LDLCALC, LDLDIRECT, TRIG, CHOLHDL Lab Results  Component Value Date   ALT 13 06/26/2018   AST 16 06/26/2018   BILITOT 0.6 06/26/2018  on lipitor  QD.     Patient Active Problem List   Diagnosis Date Noted  .  Pacemaker 05/28/2018  . OSA (obstructive sleep apnea) 05/28/2018  . Hypothyroidism 04/28/2018  . Right thyroid nodule 04/28/2018  . 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 Chronic back pain 03/14/2018  . Bradycardia 03/14/2018   Past Medical History:  Diagnosis Date  . Pacemaker   . PVNS (pigmented villonodular synovitis)   . Sciatica   . Sleep apnea   . Thyroid disease   . Varicose veins of right leg with edema    Past Surgical History:  Procedure Laterality Date  . INSERT / REPLACE / REMOVE PACEMAKER     Biotronik, dual-chamber, 05/18/2016 for SSS  . Right greater saphenous vein ablation Right    Allergies  Allergen Reactions  . Betadine [Povidone Iodine] Rash   Prior to Admission medications   Medication Sig Start Date End Date Taking? Authorizing Provider  amLODipine (NORVASC) 2.5 MG tablet Take 1.25 mg by mouth daily.   Yes [provider]  atorvastatin (LIPITOR) 10 MG tablet  02/24/18  Yes [provider]  BELBUCA 75 MCG FILM Take 1 Film by mouth 2 (two) times daily. 05/23/18  Yes [provider]  Docusate Sodium (COLACE PO) Take by mouth.   Yes [provider]  gabapentin (NEURONTIN) 600 MG tablet Take 600 mg by mouth 2 (two) times daily.   Yes [provider]  levothyroxine (SYNTHROID) 75 MCG tablet Take 1 tablet (75 mcg total) by mouth daily before breakfast. 04/28/18  Yes Romero Belling, MD  meloxicam (MOBIC) 15 MG tablet Take 1 tablet (15 mg total) by mouth daily. 04/14/18  Yes [name], MD  tadalafil (CIALIS) 10 MG tablet Take 1 tablet (10 mg total) by mouth every other day as needed for erectile dysfunction. 04/14/18  Yes [name], MD  tamsulosin (FLOMAX) 0.4 MG CAPS capsule Take 0.4 mg by mouth daily.   Yes [provider]  terbinafine (LAMISIL) 250 MG tablet Take 1 tablet (250 mg total) by mouth daily. 05/27/18  Yes Hyatt, Max T, DPM  venlafaxine (EFFEXOR) 50 MG tablet Take 1 tablet  (50 mg total) by mouth 2 (two) times daily. 07/15/18   [name], MD   Social History   Socioeconomic History  . Marital status: Single    Spouse name: Not on file  . Number of children: Not on file  . Years of education: Not on file  . Highest education level: Not on file  Occupational History  . Not on file  Social Needs  . Financial resource strain: Not on file  . Food insecurity:    Worry: Not on file    Inability: Not on file  . Transportation needs:    Medical: Not on file    Non-medical: Not on file  Tobacco Use  . Smoking status: Never Smoker  . Smokeless tobacco: Never Used  Substance and Sexual Activity  .  Alcohol use: Not Currently  . Drug use: Yes  . Sexual activity: Yes  Lifestyle  . Physical activity:    Days per week: Not on file    Minutes per session: Not on file  . Stress: Not on file  Relationships  . Social connections:    Talks on phone: Not on file    Gets together: Not on file    Attends religious service: Not on file    Active member of club or organization: Not on file    Attends meetings of clubs or organizations: Not on file    Relationship status: Not on file  . Intimate partner violence:    Fear of current or ex partner: Not on file    Emotionally abused: Not on file    Physically abused: Not on file    Forced sexual activity: Not on file  Other Topics Concern  . Not on file  Social History Narrative  . Not on file    Review of Systems     Objective:   Physical Exam Vitals signs reviewed.  Constitutional:      Appearance: He is well-developed.  HENT:     Head: Normocephalic and atraumatic.  Eyes:     Pupils: Pupils are equal, round, and reactive to light.  Neck:     Vascular: No carotid bruit or JVD.  Cardiovascular:     Rate and Rhythm: Normal rate and regular rhythm.     Heart sounds: Normal heart sounds. No murmur.  Pulmonary:     Effort: Pulmonary effort is normal.     Breath sounds: Normal breath sounds. No  rales.  Skin:    General: Skin is warm and dry.  Neurological:     Mental Status: He is alert and oriented to person, place, and time.    Vitals:   07/15/18 1431  BP: 106/71  Pulse: (!) 110  Resp: 14  Temp: 98.9 F (37.2 C)  TempSrc: Oral  SpO2: 99%  Weight: 208 lb 3.2 oz (94.4 kg)  Height: 6\' 2"  (1.88 m)          Assessment & Plan:   Mark Strong is a 48 y.o. male Anxiety disorder, unspecified type Anxiety state - Plan: venlafaxine (EFFEXOR) 50 MG tablet, DISCONTINUED: venlafaxine (EFFEXOR) 75 MG tablet  - trial of higher dose Effexor at 50mg BID. Potential side effects discussed.   Hyperlipidemia, unspecified hyperlipidemia type  - continue Lipitor same dose.    Meds ordered this encounter  Medications  . DISCONTD: venlafaxine (EFFEXOR) 75 MG tablet    Sig: Take 1 tablet (75 mg total) by mouth 2 (two) times daily with a meal.    Dispense:  180 tablet    Refill:  1  . venlafaxine (EFFEXOR) 50 MG tablet    Sig: Take 1 tablet (50 mg total) by mouth 2 (two) times daily.    Dispense:  60 tablet    Refill:  1   Patient Instructions   We can try higher dose of Effexor and recheck in next 6 weeks. See other info on anxiety below.   Return to the clinic or go to the nearest emergency room if any of your symptoms worsen or new symptoms occur.   If you have lab work done today you will be contacted with your lab results within the next 2 weeks.  If you have not heard from [place] then please contact us. The fastest way to get your results is to register for My Chart.  IF you received an x-ray today, you will receive an invoice from [facility]. Please contact [facility] Radiology at [phone] with questions or concerns regarding your invoice.   IF you received labwork today, you will receive an invoice from [place]. Please contact [facility] at [phone] with questions or concerns regarding your invoice.   Our billing staff will not be able to  assist you with questions regarding bills from these companies.  You will be contacted with the lab results as soon as they are available. The fastest way to get your results is to activate your My Chart account. Instructions are located on the last page of this paperwork. If you have not heard from [place] regarding the results in 2 weeks, please contact this office.       Signed,   Meredith Staggers, MD Primary Care at [facility].  08/11/18 9:25 AM

## 2018-08-12 NOTE — Telephone Encounter (Signed)
I called patient to find out what is going on cause I did not see any notes on any mention of ENT referral or you checking his ear. Patient stated that you and him did discuss ear problem and that your notes got erased. He stated that you left a msg that you were trying to call him back. Patient is waiting on a return call

## 2018-08-12 NOTE — Telephone Encounter (Signed)
Feels like voice is muffled, every so often.  Occasional static noise in both ears. Sx;s for months, but seems to be getting worse.   Will refer to ENT for further eval

## 2018-08-13 NOTE — Progress Notes (Signed)
Cardiology Office Note:    Date:  08/20/2018   ID:  Mark Strong, DOB 1970/12/20, MRN [id number]  PCP:  [name], MD  Cardiologist:  No primary care provider on file.  Electrophysiologist:  None   Referring MD: [name], MD   No chief complaint on file.   History of Present Illness:    Mark Strong is a 48 y.o. 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  He received a dual-chamber permanent pacemaker in 2017 in [place].   He feels substantially improved after we made the changes to his pacemaker his last appointment.  We switched from accelerometer to closed loop sensor driven rate response.  His histograms are substantially improved.  He is exercising vigorously at the gym and notes that his heart rate now reaches the 110-120 range.  He reports improved exercise tolerance.  He has lost 21 pounds since December.  The patient specifically denies any chest pain at rest exertion, dyspnea at rest or with exertion, orthopnea, paroxysmal nocturnal dyspnea, syncope, palpitations, focal neurological deficits, intermittent claudication, lower extremity edema, unexplained weight gain, cough, hemoptysis or wheezing.  His dual-chamber Biotronik E. Luna 8 pacemaker was implanted in 2017.  He has 88% atrial pacing and only 1% ventricular pacing.  The heart rate distribution is definitely improved, but still slightly blunted.  As per his report he rarely reaches a heart rate of 120 bpm.  Considering his age this only represents about 70% of the maximum predicted for his age even during intense activity.  Lead parameters are excellent.  A single episode of roughly 15 beats of paroxysmal atrial tachycardia is noted.  CLS based rate response was adjusted with increase in upper sensor rate by another 10 bpm.  He was started on a very low-dose of amlodipine for suspected coronary spasm.  His blood pressure is low but  he has not had any dizziness lightheadedness or syncope.  He also takes tamsulosin for prostatism. He is on a statin. Uses Cialis for ED.  Compliant with CPAP for sleep apnea.  Has chronic pain and receives chronic buprenorphine.  Supplements for hypothyroidism and allopurinol for hyperuricemia.  Prior to pacemaker implantation he had normal coronary angiography in 2016 and EF 55%.  He has a history of endovenous laser ablation of the right greater saphenous vein.  Past Medical History:  Diagnosis Date  . Pacemaker   . PVNS (pigmented villonodular synovitis)   . Sciatica   . Sleep apnea   . Thyroid disease   . Varicose veins of right leg with edema     Past Surgical History:  Procedure Laterality Date  . INSERT / REPLACE / REMOVE PACEMAKER     Biotronik, dual-chamber, 05/18/2016 for SSS  . Right greater saphenous vein ablation Right     Current Medications: Current Meds  Medication Sig  . amLODipine (NORVASC) 2.5 MG tablet Take 1.25 mg by mouth daily.  Marland Kitchen atorvastatin (LIPITOR) 10 MG tablet   . BELBUCA 75 MCG FILM Take 1 Film by mouth 2 (two) times daily.  Tery Sanfilippo Sodium (COLACE PO) Take by mouth.  . gabapentin (NEURONTIN) 600 MG tablet Take 600 mg by mouth 2 (two) times daily.  Marland Kitchen levothyroxine (SYNTHROID) 75 MCG tablet Take 1 tablet (75 mcg total) by mouth daily before breakfast.  . meloxicam (MOBIC) 15 MG tablet Take 1 tablet (15 mg total) by mouth daily.  . tadalafil (CIALIS) 10 MG tablet Take 1 tablet (  10 mg total) by mouth every other day as needed for erectile dysfunction.  . tamsulosin (FLOMAX) 0.4 MG CAPS capsule Take 0.4 mg by mouth daily.  Marland Kitchen terbinafine (LAMISIL) 250 MG tablet Take 1 tablet (250 mg total) by mouth daily.  Marland Kitchen venlafaxine (EFFEXOR) 50 MG tablet Take 1 tablet (50 mg total) by mouth 2 (two) times daily.     Allergies:   Betadine [povidone iodine]   Social History   Socioeconomic History  . Marital status: Single    Spouse name: Not on file  . Number of  children: Not on file  . Years of education: Not on file  . Highest education level: Not on file  Occupational History  . Not on file  Social Needs  . Financial resource strain: Not on file  . Food insecurity:    Worry: Not on file    Inability: Not on file  . Transportation needs:    Medical: Not on file    Non-medical: Not on file  Tobacco Use  . Smoking status: Never Smoker  . Smokeless tobacco: Never Used  Substance and Sexual Activity  . Alcohol use: Not Currently  . Drug use: Yes  . Sexual activity: Yes  Lifestyle  . Physical activity:    Days per week: Not on file    Minutes per session: Not on file  . Stress: Not on file  Relationships  . Social connections:    Talks on phone: Not on file    Gets together: Not on file    Attends religious service: Not on file    Active member of club or organization: Not on file    Attends meetings of clubs or organizations: Not on file    Relationship status: Not on file  Other Topics Concern  . Not on file  Social History Narrative  . Not on file     Family History: The patient's family history includes Arthritis in his father and mother; COPD in his father; Depression in his mother; Diabetes in his father; Heart disease in his mother; Hyperlipidemia in his father; Hypertension in his father; Kidney disease in his father; Thyroid disease in his father.  ROS:   Please see the history of present illness.     All other systems reviewed and are negative.  EKGs/Labs/Other Studies Reviewed:    The following studies were reviewed today: Comprehensive pacemaker check  EKG:  EKG is not ordered today.  It shows sinus rhythm at 60 bpm, otherwise normal tracing.  QTc is 416 ms  Recent Labs: 04/28/2018: TSH 0.63 06/26/2018: ALT 13  Recent Lipid Panel No results found for: CHOL, TRIG, HDL, CHOLHDL, VLDL, LDLCALC, LDLDIRECT  Physical Exam:    VS:  BP 100/64   Pulse 60   Ht 6\' 2"  (1.88 m)   Wt 200 lb 9.6 oz (91 kg)   BMI 25.76  kg/m     Wt Readings from Last 3 Encounters:  08/19/18 200 lb 9.6 oz (91 kg)  07/15/18 208 lb 3.2 oz (94.4 kg)  05/28/18 229 lb (103.9 kg)     GEN: Looks very fit compared to his last appointment, well nourished, well developed in no acute distress.  Healthy left subclavian pacemaker site HEENT: Normal NECK: No JVD; No carotid bruits LYMPHATICS: No lymphadenopathy CARDIAC: RRR, no murmurs, rubs, gallops .  Well-healed left subclavian pacemaker site RESPIRATORY:  Clear to auscultation without rales, wheezing or rhonchi  ABDOMEN: Soft, non-tender, non-distended MUSCULOSKELETAL:  No edema; No deformity  SKIN: Warm and dry NEUROLOGIC:  Alert and oriented x 3 PSYCHIATRIC:  Normal affect   ASSESSMENT:    1. SSS (sick sinus syndrome) (HCC)   2. Pacemaker   3. OSA (obstructive sleep apnea)    PLAN:    In order of problems listed above:  1. SSS: improved heart rate histograms and markedly improved exercise tolerance since the last pacemaker sensor changes. 2. Pacemaker: Doing much better on CLS based rate response, further minor adjustments made today.  Follow-up in the office yearly, continue with remote downloads every 3 months. 3. OSA: Reports 100% compliance with CPAP and good benefit from treatment.  He denies any daytime hypersomnolence.  Medication Adjustments/Labs and Tests Ordered: Current medicines are reviewed at length with the patient today.  Concerns regarding medicines are outlined above.  Orders Placed This Encounter  Procedures  . EKG 12-Lead   No orders of the defined types were placed in this encounter.   Patient Instructions  Medication Instructions:  Your physician recommends that you continue on your current medications as directed. Please refer to the Current Medication list given to you today.  If you need a refill on your cardiac medications before your next appointment, please call your pharmacy.    Follow-Up: At [facility], you and your health  needs are our priority.  As part of our continuing mission to provide you with exceptional heart care, we have created designated Provider Care Teams.  These Care Teams include your primary Cardiologist (physician) and Advanced Practice Providers (APPs -  Physician Assistants and Nurse Practitioners) who all work together to provide you with the care you need, when you need it. You will need a follow up appointment in 1 years.  Please call our office 2 months in advance to schedule this appointment.  You may see Dr. Royann Shivers or one of the following Advanced Practice Providers on your designated Care Team: Azalee Course, [place] . Micah Flesher, PA-C  Any Other Special Instructions Will Be Listed Below (If Applicable). None      Signed, Thurmon Fair, MD  08/20/2018 8:11 AM    [facility]

## 2018-08-19 ENCOUNTER — Encounter: Payer: Self-pay | Admitting: Cardiovascular Disease

## 2018-08-19 ENCOUNTER — Ambulatory Visit: Payer: Medicare Other | Admitting: Cardiovascular Disease

## 2018-08-19 VITALS — BP 100/64 | HR 60 | Ht 74.0 in | Wt 200.6 lb

## 2018-08-19 DIAGNOSIS — Z950 Presence of cardiac pacemaker: Secondary | ICD-10-CM | POA: Diagnosis not present

## 2018-08-19 DIAGNOSIS — I495 Sick sinus syndrome: Secondary | ICD-10-CM | POA: Diagnosis not present

## 2018-08-19 DIAGNOSIS — G4733 Obstructive sleep apnea (adult) (pediatric): Secondary | ICD-10-CM | POA: Diagnosis not present

## 2018-08-19 NOTE — Patient Instructions (Signed)
Medication Instructions:  Your physician recommends that you continue on your current medications as directed. Please refer to the Current Medication list given to you today.  If you need a refill on your cardiac medications before your next appointment, please call your pharmacy.    Follow-Up: At [facility], you and your health needs are our priority.  As part of our continuing mission to provide you with exceptional heart care, we have created designated Provider Care Teams.  These Care Teams include your primary Cardiologist (physician) and Advanced Practice Providers (APPs -  Physician Assistants and Nurse Practitioners) who all work together to provide you with the care you need, when you need it. You will need a follow up appointment in 1 years.  Please call our office 2 months in advance to schedule this appointment.  You may see Dr. [name] or one of the following Advanced Practice Providers on your designated Care Team: Hao Meng, PA-C . Angela Duke, PA-C  Any Other Special Instructions Will Be Listed Below (If Applicable). None   

## 2018-08-20 DIAGNOSIS — I495 Sick sinus syndrome: Secondary | ICD-10-CM | POA: Insufficient documentation

## 2018-08-21 ENCOUNTER — Other Ambulatory Visit: Payer: Self-pay | Admitting: Cardiovascular Disease

## 2018-08-26 ENCOUNTER — Ambulatory Visit: Payer: Medicare Other | Admitting: Podiatry

## 2018-08-26 ENCOUNTER — Ambulatory Visit: Payer: Medicare Other | Admitting: Family Medicine

## 2018-08-26 ENCOUNTER — Encounter: Payer: Self-pay | Admitting: Family Medicine

## 2018-08-26 ENCOUNTER — Encounter: Payer: Self-pay | Admitting: Neurology

## 2018-08-26 ENCOUNTER — Other Ambulatory Visit: Payer: Self-pay

## 2018-08-26 VITALS — BP 105/57 | HR 73 | Temp 98.3°F | Resp 16 | Ht 74.0 in | Wt 201.6 lb

## 2018-08-26 DIAGNOSIS — I10 Essential (primary) hypertension: Secondary | ICD-10-CM

## 2018-08-26 DIAGNOSIS — R55 Syncope and collapse: Secondary | ICD-10-CM | POA: Diagnosis not present

## 2018-08-26 DIAGNOSIS — H9190 Unspecified hearing loss, unspecified ear: Secondary | ICD-10-CM | POA: Diagnosis not present

## 2018-08-26 DIAGNOSIS — F411 Generalized anxiety disorder: Secondary | ICD-10-CM

## 2018-08-26 DIAGNOSIS — E785 Hyperlipidemia, unspecified: Secondary | ICD-10-CM

## 2018-08-26 MED ORDER — VENLAFAXINE HCL 50 MG PO TABS: 50.0000 mg | ORAL_TABLET | Freq: Two times a day (BID) | ORAL | 1 refills | Status: DC

## 2018-08-26 MED ORDER — AMLODIPINE BESYLATE 2.5 MG PO TABS: 1.2500 mg | ORAL_TABLET | Freq: Every day | ORAL | 1 refills | Status: DC

## 2018-08-26 NOTE — Patient Instructions (Addendum)
  Call Dr. Royann Shivers to discuss the near syncope episodes. I will also refer you to neuro, but would start with cardiology discussion first. Return to the clinic or go to the nearest emergency room if any of your symptoms worsen or new symptoms occur.  I am glad to hear the new dose of Effexor is working better.  No changes in medications for now.  I will check some screening blood work.  For the muffled hearing sensation, and near syncope as above, I will refer you to neurology to decide on the next step and appropriate imaging.  MRI may be next step, but I want to make sure we order the correct test.  Please discuss that further with neurology given how long symptoms have now been present.Return to the clinic or go to the nearest emergency room if any of your symptoms worsen or new symptoms occur.   If you have lab work done today you will be contacted with your lab results within the next 2 weeks.  If you have not heard from [place] then please contact us. The fastest way to get your results is to register for My Chart.   IF you received an x-ray today, you will receive an invoice from [facility]. Please contact [facility] at [phone] with questions or concerns regarding your invoice.   IF you received labwork today, you will receive an invoice from [facility]. Please contact [facility] at [phone] with questions or concerns regarding your invoice.   Our billing staff will not be able to assist you with questions regarding bills from these companies.  You will be contacted with the lab results as soon as they are available. The fastest way to get your results is to activate your My Chart account. Instructions are located on the last page of this paperwork. If you have not heard from [place] regarding the results in 2 weeks, please contact this office.

## 2018-08-26 NOTE — Progress Notes (Signed)
Subjective:    Patient ID: Mark Strong, male    DOB: [birth year], 48 y.o.   MRN: [id number]  HPI Mark Strong is a 48 y.o. male Presents today for: Chief Complaint  Patient presents with  . Anxiety    6 week f/u on anxiety. Was put on effexor 100 mg seem to be working out great. Need a refill on effexor  . Ear Fullness    Seen ENT and he stated he will send you a msg stated he thinks I need to see a Neurologist. I do not think I need to see a Neuologist but i would like to talk to you about this issus and see what you think. Aldo seen [facility] and he stated my pace maker is doing good and just f/u in 1 year  . Hypertension    need a refill on norvasc 2.5mg    Anxiety:  Effexor dose change last visit.  Now on 50 mg twice daily.  Symptoms have improved. Feel like at right dose. Noticed happier, less stressed.   History of sick sinus syndrome  -Saw cardiology March 3.  Doing well with last pacemaker since  changes.  Plan for repeat downloads every 3 months, compliant with CPAP for OSA.  Hyperlipidemia:  No results found for: CHOL, HDL, LDLCALC, LDLDIRECT, TRIG, CHOLHDL Lab Results  Component Value Date   ALT 13 06/26/2018   AST 16 06/26/2018   BILITOT 0.6 06/26/2018  on lipitor  qd.    Ear fullness: Reportedly has seen ear nose and throat - Dr. Ezzard Standing, notes are pending. Saw ENT few days ago. Both sides feel full. Blacked out feeling 4 times in past 4 months.  Last episode of near syncope 3 days ago - walking around in house. Felt a weird feeling in head, everything started to go black.  Improved in 3-5 minutes. No focal weakness, no chest pain, no palpitations. Did not discuss with cardiology at last visit.  ENT Recommended neuro eval. Pt not sure if needs to see neuro. Brother with stage 4 cancer, initially had abnormal smell/taste, and diagnosed with cancer. No personal smell/taste change.  Has episodes where voice sounds muffled, can hear ok otherwise. Comes  and goes. Had testing in [place] in 2018-19 (brings copies of records). Nl hearing screen 06/05/16. Similar sx's noted on 06/05/16. Trial of nasonex, no change. Consider CT temporal lobes- did have CT head in March 2019 - result noted on patient's phone. 09/13/17 - normal noncontrast CT brain. Neuro - neg EEG 4/17. 5/19 note - short term memory loss. TCD normal, carotid normal, NTX decreased working memory, EEG normal.  No hearing changes during times of near syncope.  No headache.   Hypertension: BP Readings from Last 3 Encounters:  08/19/18 100/64  07/15/18 106/71  05/28/18 112/74  Creatinine 0.96 with normal GFR in May 2019 on scanned labs   Patient Active Problem List   Diagnosis Date Noted  . SSS (sick sinus syndrome) (HCC) 08/20/2018  . Pacemaker 05/28/2018  . OSA (obstructive sleep apnea) 05/28/2018  . Hypothyroidism 04/28/2018  . Right thyroid nodule 04/28/2018  . 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 Chronic back pain 03/14/2018  . Bradycardia 03/14/2018   Past Medical History:  Diagnosis Date  . Pacemaker   . PVNS (pigmented villonodular synovitis)   . Sciatica   . Sleep apnea   . Thyroid disease   . Varicose veins of right leg with edema    Past Surgical History:  Procedure Laterality Date  .  INSERT / REPLACE / REMOVE PACEMAKER     Biotronik, dual-chamber, 05/18/2016 for SSS  . Right greater saphenous vein ablation Right    Allergies  Allergen Reactions  . Betadine [Povidone Iodine] Rash   Prior to Admission medications   Medication Sig Start Date End Date Taking? Authorizing Provider  amLODipine (NORVASC) 2.5 MG tablet Take 1.25 mg by mouth daily.   Yes [provider]  atorvastatin (LIPITOR) 10 MG tablet  02/24/18  Yes [provider]  BELBUCA 75 MCG FILM Take 1 Film by mouth 2 (two) times daily. 05/23/18  Yes [provider]  Docusate Sodium (COLACE PO) Take by mouth.   Yes [provider]  gabapentin (NEURONTIN)  600 MG tablet Take 600 mg by mouth 2 (two) times daily.   Yes [provider]  levothyroxine (SYNTHROID) 75 MCG tablet Take 1 tablet (75 mcg total) by mouth daily before breakfast. 04/28/18  Yes Romero Belling, MD  meloxicam (MOBIC) 15 MG tablet Take 1 tablet (15 mg total) by mouth daily. 04/14/18  Yes [name], MD  tadalafil (CIALIS) 10 MG tablet Take 1 tablet (10 mg total) by mouth every other day as needed for erectile dysfunction. 04/14/18  Yes [name], MD  tamsulosin (FLOMAX) 0.4 MG CAPS capsule Take 0.4 mg by mouth daily.   Yes [provider]  terbinafine (LAMISIL) 250 MG tablet Take 1 tablet (250 mg total) by mouth daily. 05/27/18  Yes Hyatt, Max T, DPM  venlafaxine (EFFEXOR) 50 MG tablet Take 1 tablet (50 mg total) by mouth 2 (two) times daily. 07/15/18  Yes [name], MD   Social History   Socioeconomic History  . Marital status: Single    Spouse name: Not on file  . Number of children: Not on file  . Years of education: Not on file  . Highest education level: Not on file  Occupational History  . Not on file  Social Needs  . Financial resource strain: Not on file  . Food insecurity:    Worry: Not on file    Inability: Not on file  . Transportation needs:    Medical: Not on file    Non-medical: Not on file  Tobacco Use  . Smoking status: Never Smoker  . Smokeless tobacco: Never Used  Substance and Sexual Activity  . Alcohol use: Not Currently  . Drug use: Yes  . Sexual activity: Yes  Lifestyle  . Physical activity:    Days per week: Not on file    Minutes per session: Not on file  . Stress: Not on file  Relationships  . Social connections:    Talks on phone: Not on file    Gets together: Not on file    Attends religious service: Not on file    Active member of club or organization: Not on file    Attends meetings of clubs or organizations: Not on file    Relationship status: Not on file  . Intimate partner violence:     Fear of current or ex partner: Not on file    Emotionally abused: Not on file    Physically abused: Not on file    Forced sexual activity: Not on file  Other Topics Concern  . Not on file  Social History Narrative  . Not on file    Review of Systems Per HPI     Objective:   Physical Exam Vitals signs reviewed.  Constitutional:      Appearance: He  is well-developed.  HENT:     Head: Normocephalic and atraumatic.     Right Ear: Tympanic membrane, ear canal and external ear normal. There is no impacted cerumen.     Left Ear: Tympanic membrane, ear canal and external ear normal. There is no impacted cerumen.  Eyes:     Extraocular Movements:     Right eye: No nystagmus.     Left eye: No nystagmus.     Pupils: Pupils are equal, round, and reactive to light.  Neck:     Vascular: No carotid bruit or JVD.  Cardiovascular:     Rate and Rhythm: Normal rate and regular rhythm.     Heart sounds: Normal heart sounds. No murmur.  Pulmonary:     Effort: Pulmonary effort is normal.     Breath sounds: Normal breath sounds. No rales.  Skin:    General: Skin is warm and dry.  Neurological:     Mental Status: He is alert and oriented to person, place, and time.     GCS: GCS eye subscore is 4. GCS verbal subscore is 5. GCS motor subscore is 6.     Cranial Nerves: No cranial nerve deficit, dysarthria or facial asymmetry.     Sensory: Sensation is intact. No sensory deficit.     Motor: Motor function is intact. No pronator drift.     Coordination: Coordination is intact.    Vitals:   08/26/18 1021  BP: (!) 105/57  Pulse: 73  Resp: 16  Temp: 98.3 F (36.8 C)  TempSrc: Oral  SpO2: 100%  Weight: 201 lb 9.6 oz (91.4 kg)  Height: 6\' 2"  (1.88 m)       Assessment & Plan:   Jasir Urbanowicz is a 48 y.o. male Essential hypertension - Plan: amLODipine (NORVASC) 2.5 MG tablet  -  Stable, tolerating current regimen. Medications refilled. Labs pending.   Anxiety state - Plan:  venlafaxine (EFFEXOR) 50 MG tablet  -Stable on new dose of Effexor, continue same  Change in hearing, unspecified laterality - Plan: Ambulatory referral to Neurology Near syncope - Plan: Ambulatory referral to Neurology  -Longstanding symptoms of episodes where he feels like his voice is muffled but can hear others.  Status post ENT eval without apparent concerns, will watch for note from that provider.  Apparently neurology eval was recommended.  He has been seen by ENT and neuro in [place], notes as above including negative EEG, transcranial Doppler, CT head 1 year ago.  Could possibly consider MRI brain as next step but will have him evaluated with neurology first  -For near syncopal symptoms, recommend he initially discuss those with cardiology, but also referred neuro as above.  ER/911 precautions if acute worsening of symptoms.  Hyperlipidemia, unspecified hyperlipidemia type - Plan: Comprehensive metabolic panel, Lipid panel  -No med changes.  Check labs.  Meds ordered this encounter  Medications  . amLODipine (NORVASC) 2.5 MG tablet    Sig: Take 0.5 tablets (1.25 mg total) by mouth daily.    Dispense:  90 tablet    Refill:  1  . venlafaxine (EFFEXOR) 50 MG tablet    Sig: Take 1 tablet (50 mg total) by mouth 2 (two) times daily.    Dispense:  180 tablet    Refill:  1   Patient Instructions    Call Dr. Royann Shivers to discuss the near syncope episodes. I will also refer you to neuro, but would start with cardiology discussion first. Return to the clinic or go  to the nearest emergency room if any of your symptoms worsen or new symptoms occur.  I am glad to hear the new dose of Effexor is working better.  No changes in medications for now.  I will check some screening blood work.  For the muffled hearing sensation, and near syncope as above, I will refer you to neurology to decide on the next step and appropriate imaging.  MRI may be next step, but I want to make sure we order the  correct test.  Please discuss that further with neurology given how long symptoms have now been present.Return to the clinic or go to the nearest emergency room if any of your symptoms worsen or new symptoms occur.   If you have lab work done today you will be contacted with your lab results within the next 2 weeks.  If you have not heard from [place] then please contact us. The fastest way to get your results is to register for My Chart.   IF you received an x-ray today, you will receive an invoice from [facility]. Please contact [facility] at [phone] with questions or concerns regarding your invoice.   IF you received labwork today, you will receive an invoice from [facility]. Please contact [facility] at [phone] with questions or concerns regarding your invoice.   Our billing staff will not be able to assist you with questions regarding bills from these companies.  You will be contacted with the lab results as soon as they are available. The fastest way to get your results is to activate your My Chart account. Instructions are located on the last page of this paperwork. If you have not heard from [place] regarding the results in 2 weeks, please contact this office.       Signed,   Meredith Staggers, MD Primary Care at [facility].  08/26/18 12:47 PM

## 2018-08-27 LAB — COMPREHENSIVE METABOLIC PANEL
ALT: 12 IU/L (ref 0–44)
AST: 20 IU/L (ref 0–40)
Albumin/Globulin Ratio: 2 (ref 1.2–2.2)
Albumin: 4.6 g/dL (ref 4.0–5.0)
Alkaline Phosphatase: 45 IU/L (ref 39–117)
BUN/Creatinine Ratio: 10 (ref 9–20)
BUN: 11 mg/dL (ref 6–24)
Bilirubin Total: 0.5 mg/dL (ref 0.0–1.2)
CO2: 24 mmol/L (ref 20–29)
Calcium: 9.9 mg/dL (ref 8.7–10.2)
Chloride: 104 mmol/L (ref 96–106)
Creatinine, Ser: 1.14 mg/dL (ref 0.76–1.27)
GFR calc Af Amer: 88 mL/min/{1.73_m2} (ref >59)
GFR calc non Af Amer: 76 mL/min/{1.73_m2} (ref >59)
Globulin, Total: 2.3 g/dL (ref 1.5–4.5)
Glucose: 92 mg/dL (ref 65–99)
Potassium: 4.6 mmol/L (ref 3.5–5.2)
Sodium: 144 mmol/L (ref 134–144)
Total Protein: 6.9 g/dL (ref 6.0–8.5)

## 2018-08-27 LAB — LIPID PANEL
Chol/HDL Ratio: 2.3 ratio (ref 0.0–5.0)
Cholesterol, Total: 155 mg/dL (ref 100–199)
HDL: 68 mg/dL (ref >39)
LDL Calculated: 74 mg/dL (ref 0–99)
Triglycerides: 66 mg/dL (ref 0–149)
VLDL Cholesterol Cal: 13 mg/dL (ref 5–40)

## 2018-08-28 ENCOUNTER — Encounter: Payer: Self-pay | Admitting: Podiatry

## 2018-08-28 ENCOUNTER — Other Ambulatory Visit: Payer: Self-pay

## 2018-08-28 ENCOUNTER — Ambulatory Visit: Payer: Medicare Other | Admitting: Podiatry

## 2018-08-28 DIAGNOSIS — L603 Nail dystrophy: Secondary | ICD-10-CM | POA: Diagnosis not present

## 2018-08-28 MED ORDER — TERBINAFINE HCL 250 MG PO TABS: 250.0000 mg | ORAL_TABLET | Freq: Every day | ORAL | 0 refills | Status: DC

## 2018-08-28 NOTE — Progress Notes (Signed)
He presents today for follow-up of fungus and onychomycosis and tinea pedis.  He said my feet are feeling and looking much better.  Denies any problems taking medication.  Objective: Vital signs are stable he is alert oriented x3.  There is no erythema edema cellulitis change odor plantar aspect of the foot appears to be healing very nicely.  Onychomycosis appears to be resolving but very slowly particularly the hallux left.  Assessment: Well-healing tinea pedis and onychomycosis.  Plan: I encouraged him to take 1 Lamisil tablet every other day for the next 2 months and I will follow-up with him in 3 months.  We sent that prescription over

## 2018-08-28 NOTE — Patient Instructions (Signed)
Dr. Hyatt has sent over a refill for Lamisil to your pharmacy today. The instructions on your bottle will say "take 1 tablet daily", however, he would like for you to take one pill every other day. He will follow up with you in 3 months to re-evaluate your toenails. 

## 2018-09-02 ENCOUNTER — Encounter: Payer: Self-pay | Admitting: Family Medicine

## 2018-09-03 ENCOUNTER — Encounter: Payer: Self-pay | Admitting: Family Medicine

## 2018-09-04 ENCOUNTER — Encounter: Payer: Self-pay | Admitting: Cardiovascular Disease

## 2018-09-04 MED ORDER — TAMSULOSIN HCL 0.4 MG PO CAPS: 0.4000 mg | ORAL_CAPSULE | Freq: Every day | ORAL | 3 refills | Status: DC

## 2018-09-04 MED ORDER — ATORVASTATIN CALCIUM 10 MG PO TABS: 10.0000 mg | ORAL_TABLET | Freq: Every day | ORAL | 3 refills | Status: DC

## 2018-09-04 NOTE — Progress Notes (Signed)
Received Home Monitoring alert for AT episode on 09/02/18. EGM shows A-flutter/Vp, duration 26sec. CHA2DS2-VASc score 0.  Routed to Dr. Royann Shivers for review.

## 2018-09-04 NOTE — Progress Notes (Signed)
Will continue to monitor for now, no change in meds. Thank you MCr

## 2018-09-05 LAB — CUP PACEART INCLINIC DEVICE CHECK
Date Time Interrogation Session: 20200320114538
Implantable Lead Implant Date: 20171217
Implantable Lead Implant Date: 20171217
Implantable Lead Location: 753859
Implantable Lead Location: 753860
Implantable Lead Model: 377
Implantable Lead Model: 377
Implantable Lead Serial Number: 49336100
Implantable Lead Serial Number: 49685190
Implantable Pulse Generator Implant Date: 20171217
Pulse Gen Model: 394929
Pulse Gen Serial Number: 68775417

## 2018-09-10 ENCOUNTER — Other Ambulatory Visit (HOSPITAL_COMMUNITY): Payer: Self-pay | Admitting: Orthopedic Surgery

## 2018-09-10 DIAGNOSIS — M542 Cervicalgia: Secondary | ICD-10-CM

## 2018-10-10 ENCOUNTER — Telehealth: Payer: Self-pay

## 2018-10-10 NOTE — Telephone Encounter (Signed)
Per Dr Rennis Golden - This is Dr. Erin Hearing patient, please reschedule with him.  lmtcb to reschedule

## 2018-10-14 ENCOUNTER — Telehealth: Payer: Self-pay | Admitting: Cardiovascular Disease

## 2018-10-14 ENCOUNTER — Ambulatory Visit: Payer: Medicare Other | Admitting: Internal Medicine

## 2018-10-14 NOTE — Telephone Encounter (Signed)
Mychart, smartphone, pre reg complete 10/14/18 AF °

## 2018-10-15 ENCOUNTER — Telehealth (INDEPENDENT_AMBULATORY_CARE_PROVIDER_SITE_OTHER): Payer: Medicare Other | Admitting: Cardiovascular Disease

## 2018-10-15 ENCOUNTER — Encounter: Payer: Self-pay | Admitting: Cardiovascular Disease

## 2018-10-15 VITALS — Ht 74.0 in | Wt 185.6 lb

## 2018-10-15 DIAGNOSIS — E7800 Pure hypercholesterolemia, unspecified: Secondary | ICD-10-CM | POA: Insufficient documentation

## 2018-10-15 DIAGNOSIS — Z950 Presence of cardiac pacemaker: Secondary | ICD-10-CM

## 2018-10-15 DIAGNOSIS — I495 Sick sinus syndrome: Secondary | ICD-10-CM

## 2018-10-15 DIAGNOSIS — G4733 Obstructive sleep apnea (adult) (pediatric): Secondary | ICD-10-CM

## 2018-10-15 MED ORDER — BLOOD PRESSURE CUFF MISC: 1.0000 | Freq: Once | 0 refills | Status: AC

## 2018-10-15 NOTE — Patient Instructions (Signed)
Medication Instructions:  STOP Amlodipine  Please keep a daily log of your blood pressures along with the date, time, and any episodes of dizziness/lightheadedness, or feelings of passing out.  If you need a refill on your cardiac medications before your next appointment, please call your pharmacy.    Follow-Up: At [facility], you and your health needs are our priority.  As part of our continuing mission to provide you with exceptional heart care, we have created designated Provider Care Teams.  These Care Teams include your primary Cardiologist (physician) and Advanced Practice Providers (APPs -  Physician Assistants and Nurse Practitioners) who all work together to provide you with the care you need, when you need it. . You have been schedule for a follow-up virtual visit with Dr. Royann Shivers on Thursday, 12/18/18 at 8:30 AM.  Any Other Special Instructions Will Be Listed Below (If Applicable). Your physician has requested that you regularly monitor and record your blood pressure readings at home. Please use the same machine at the same time of day to check your readings and record them to bring to your follow-up visit.  I have sent an Rx for a blood pressure cuff to your pharmacy.

## 2018-10-15 NOTE — Progress Notes (Signed)
Virtual Visit via Video Note   This visit type was conducted due to national recommendations for restrictions regarding the COVID-19 Pandemic (e.g. social distancing) in an effort to limit this patient's exposure and mitigate transmission in our community.  Due to his co-morbid illnesses, this patient is at least at moderate risk for complications without adequate follow up.  This format is felt to be most appropriate for this patient at this time.  All issues noted in this document were discussed and addressed.  A limited physical exam was performed with this format.  Please refer to the patient's chart for his consent to telehealth for [facility].   Evaluation Performed:  Follow-up visit  Date:  10/15/2018   ID:  Mark Strong, DOB 28-Mar-1971, MRN [id number]  Patient Location: Home Provider Location: Home  PCP:  [name], MD  Cardiologist:  Talene Glastetter Electrophysiologist:  None   Chief Complaint:  Near syncope  History of Present Illness:    Mark Strong is a 48 y.o. male with remarkably of early onset sinus node dysfunction and a normally functioning dual-chamber permanent pacemaker.  He had a couple of episodes of near syncope that have only occurred when he is upright.  One happened while he was getting out of bed.  Another one happened several minutes after getting out of bed but also when standing.  He feels lightheaded and feels like he will almost lose consciousness before recovering.  The episodes last for about a minute at a time.  He has not experienced full-blown syncope and denies chest pain, dyspnea, edema, claudication, orthopnea, PND, seizure activity, other focal neurological complaints.  In general he feels really well.  His pacemaker does show an episode of atrial flutter with controlled ventricular response that occurred on March 17 at about 2:30 PM and lasted for about 5-1/2 minutes.  He was unaware of palpitations and did not have any unusual  activity or any symptoms around that time.  It did not coincide with any of his near syncope events.  The patient does not have symptoms concerning for COVID-19 infection (fever, chills, cough, or new shortness of breath).    Past Medical History:  Diagnosis Date  . Pacemaker   . PVNS (pigmented villonodular synovitis)   . Sciatica   . Sleep apnea   . Thyroid disease   . Varicose veins of right leg with edema    Past Surgical History:  Procedure Laterality Date  . INSERT / REPLACE / REMOVE PACEMAKER     Biotronik, dual-chamber, 05/18/2016 for SSS  . Right greater saphenous vein ablation Right      Current Meds  Medication Sig  . amLODipine (NORVASC) 2.5 MG tablet Take 0.5 tablets (1.25 mg total) by mouth daily.  Marland Kitchen atorvastatin (LIPITOR) 10 MG tablet Take 1 tablet (10 mg total) by mouth daily at 6 PM.  . BELBUCA 75 MCG FILM Take 1 Film by mouth 2 (two) times daily.  Tery Sanfilippo Sodium (COLACE PO) Take by mouth.  . levothyroxine (SYNTHROID) 75 MCG tablet Take 1 tablet (75 mcg total) by mouth daily before breakfast.  . meloxicam (MOBIC) 15 MG tablet Take 1 tablet (15 mg total) by mouth daily.  . Multiple Vitamin (MULTIVITAMIN) capsule Take 1 capsule by mouth daily.  . tadalafil (CIALIS) 10 MG tablet Take 1 tablet (10 mg total) by mouth every other day as needed for erectile dysfunction.  . tamsulosin (FLOMAX) 0.4 MG CAPS capsule Take 1 capsule (0.4 mg total) by mouth daily.  Marland Kitchen  terbinafine (LAMISIL) 250 MG tablet Take 1 tablet (250 mg total) by mouth daily. (Patient taking differently: Take 250 mg by mouth daily. )  . venlafaxine (EFFEXOR) 50 MG tablet Take 1 tablet (50 mg total) by mouth 2 (two) times daily.     Allergies:   Betadine [povidone iodine]   Social History   Tobacco Use  . Smoking status: Never Smoker  . Smokeless tobacco: Never Used  Substance Use Topics  . Alcohol use: Not Currently  . Drug use: Yes     Family Hx: The patient's family history includes  Arthritis in his father and mother; COPD in his father; Depression in his mother; Diabetes in his father; Heart disease in his mother; Hyperlipidemia in his father; Hypertension in his father; Kidney disease in his father; Thyroid disease in his father.  ROS:   Please see the history of present illness.     All other systems reviewed and are negative.   Prior CV studies:   The following studies were reviewed today: Recent pacemaker reported episode of atrial flutter tracings  Labs/Other Tests and Data Reviewed:    EKG:  An ECG dated 08/19/2018 was personally reviewed today and demonstrated:  Normal sinus rhythm, normal tracing  Recent Labs: 04/28/2018: TSH 0.63 08/26/2018: ALT 12; BUN 11; Creatinine, Ser 1.14; Potassium 4.6; Sodium 144   Recent Lipid Panel Lab Results  Component Value Date/Time   CHOL 155 08/26/2018 04:41 PM   TRIG 66 08/26/2018 04:41 PM   HDL 68 08/26/2018 04:41 PM   CHOLHDL 2.3 08/26/2018 04:41 PM   LDLCALC 74 08/26/2018 04:41 PM    Wt Readings from Last 3 Encounters:  10/15/18 185 lb 9.6 oz (84.2 kg)  08/26/18 201 lb 9.6 oz (91.4 kg)  08/19/18 200 lb 9.6 oz (91 kg)     Objective:    Vital Signs:  Ht 6\' 2"  (1.88 m)   Wt 185 lb 9.6 oz (84.2 kg)   BMI 23.83 kg/m    VITAL SIGNS:  reviewed GEN:  no acute distress EYES:  sclerae anicteric, EOMI - Extraocular Movements Intact RESPIRATORY:  normal respiratory effort, symmetric expansion CARDIOVASCULAR:  no peripheral edema SKIN:  no rash, lesions or ulcers. MUSCULOSKELETAL:  no obvious deformities. NEURO:  alert and oriented x 3, no obvious focal deficit PSYCH:  normal affect  ASSESSMENT & PLAN:    1. Near-syncope: The episodes sound more like hypotension, possibly related to orthostatic hypotension and do not sound arrhythmic in nature.  They only occur when he is upright often soon after position change.  They are relatively gradual in onset and resolution, not abrupt.  He was prescribed amlodipine  empirically for possible chest discomfort, but once he had a pacemaker implanted he never had chest discomfort again.  We will discontinue the amlodipine and see if this improves his complaints. 2. Atrial flutter: This was very brief and the first ever recorded event.  She does not have any risk factors for systemic embolism/stroke.  I would not recommend anticoagulation but will continue monitoring via his device.  The episode was asymptomatic.  Antiarrhythmics are not indicated. 3. SSS: Continues to report marked improvement in fatigue and shortness of breath after we turned his rate response sensor on and titrated the rate response at his last appointment. 4. PM: Normal recent device comprehensive check.  COVID-19 Education: The signs and symptoms of COVID-19 were discussed with the patient and how to seek care for testing (follow up with PCP or arrange E-visit).  The  importance of social distancing was discussed today.  Time:   Today, I have spent 21 minutes with the patient with telehealth technology discussing the above problems.     Medication Adjustments/Labs and Tests Ordered: Current medicines are reviewed at length with the patient today.  Concerns regarding medicines are outlined above.   Tests Ordered: No orders of the defined types were placed in this encounter.   Medication Changes: No orders of the defined types were placed in this encounter.   Disposition:  Follow up December 18, 2018  Signed, Thurmon Fair, MD  10/15/2018 3:04 PM    [facility]

## 2018-10-17 ENCOUNTER — Other Ambulatory Visit: Payer: Self-pay | Admitting: Podiatry

## 2018-10-20 NOTE — Telephone Encounter (Signed)
Pt called states he completed the Lamisil yesterday, and wanted to know why the Lamisil had not been refilled.

## 2018-10-20 NOTE — Telephone Encounter (Signed)
Left pt message informing, I had sent a message to Dr. Al Corpus requesting instruction on pt's refill of the Lamisil due to our office following CDC requirements for urgent care pts and that we may be able to process his labs and refill request without him being seen in office.

## 2018-10-28 ENCOUNTER — Encounter: Payer: Self-pay | Admitting: Endocrinology

## 2018-10-28 ENCOUNTER — Ambulatory Visit: Payer: Medicare Other | Admitting: Endocrinology

## 2018-10-28 ENCOUNTER — Other Ambulatory Visit: Payer: Self-pay

## 2018-10-28 VITALS — BP 110/72 | HR 119 | Ht 74.0 in | Wt 193.2 lb

## 2018-10-28 DIAGNOSIS — E039 Hypothyroidism, unspecified: Secondary | ICD-10-CM | POA: Diagnosis not present

## 2018-10-28 DIAGNOSIS — E221 Hyperprolactinemia: Secondary | ICD-10-CM | POA: Diagnosis not present

## 2018-10-28 LAB — TSH: TSH: 1.36 u[IU]/mL (ref 0.35–4.50)

## 2018-10-28 LAB — T4, FREE: Free T4: 0.87 ng/dL (ref 0.60–1.60)

## 2018-10-28 NOTE — Telephone Encounter (Signed)
Please advise 

## 2018-10-28 NOTE — Patient Instructions (Addendum)
Blood tests are requested for you today.  We'll let you know about the results.  You don't need to recheck the ultrasound in the future, unless you or I have some reason to suspect enlargement of the nodules Please continue the same medication.  Please come back for a follow-up appointment in 1 year.

## 2018-10-28 NOTE — Progress Notes (Signed)
Subjective:    Patient ID: Mark Strong, male    DOB: 1970/12/26, 48 y.o.   MRN: [id number]  HPI Pt returns for f/u of MNG and hypothyroidism (hypothyroidism was dx'ed in 2016; he had a bx in mid-2019; he says pathol recommended a repeat, due to inadeq material, but he chose to follow with serial [place] instead; F/u US in 2019 showed no nodules which met criteria for biopsy nor follow-up).  He does not notice the goiter.  Denies palpitations, diaphoresis, and tremor. Pt says he has h/o elev prolactin. Past Medical History:  Diagnosis Date  . Pacemaker   . PVNS (pigmented villonodular synovitis)   . Sciatica   . Sleep apnea   . Thyroid disease   . Varicose veins of right leg with edema     Past Surgical History:  Procedure Laterality Date  . INSERT / REPLACE / REMOVE PACEMAKER     Biotronik, dual-chamber, 05/18/2016 for SSS  . Right greater saphenous vein ablation Right     Social History   Socioeconomic History  . Marital status: Single    Spouse name: Not on file  . Number of children: Not on file  . Years of education: Not on file  . Highest education level: Not on file  Occupational History  . Not on file  Social Needs  . Financial resource strain: Not on file  . Food insecurity:    Worry: Not on file    Inability: Not on file  . Transportation needs:    Medical: Not on file    Non-medical: Not on file  Tobacco Use  . Smoking status: Never Smoker  . Smokeless tobacco: Never Used  Substance and Sexual Activity  . Alcohol use: Not Currently  . Drug use: Yes  . Sexual activity: Yes  Lifestyle  . Physical activity:    Days per week: Not on file    Minutes per session: Not on file  . Stress: Not on file  Relationships  . Social connections:    Talks on phone: Not on file    Gets together: Not on file    Attends religious service: Not on file    Active member of club or organization: Not on file    Attends meetings of clubs or organizations: Not on file   Relationship status: Not on file  . Intimate partner violence:    Fear of current or ex partner: Not on file    Emotionally abused: Not on file    Physically abused: Not on file    Forced sexual activity: Not on file  Other Topics Concern  . Not on file  Social History Narrative  . Not on file    Current Outpatient Medications on File Prior to Visit  Medication Sig Dispense Refill  . atorvastatin (LIPITOR) 10 MG tablet Take 1 tablet (10 mg total) by mouth daily at 6 PM. 90 tablet 3  . BELBUCA 75 MCG FILM Take 1 Film by mouth 2 (two) times daily.  0  . Docusate Sodium (COLACE PO) Take by mouth.    . gabapentin (NEURONTIN) 600 MG tablet Take 600 mg by mouth 2 (two) times daily.    Marland Kitchen levothyroxine (SYNTHROID) 75 MCG tablet Take 1 tablet (75 mcg total) by mouth daily before breakfast. 90 tablet 3  . meloxicam (MOBIC) 15 MG tablet Take 1 tablet (15 mg total) by mouth daily. 30 tablet 1  . Multiple Vitamin (MULTIVITAMIN) capsule Take 1 capsule by mouth daily.    Marland Kitchen  tadalafil (CIALIS) 10 MG tablet Take 1 tablet (10 mg total) by mouth every other day as needed for erectile dysfunction. 30 tablet 1  . tamsulosin (FLOMAX) 0.4 MG CAPS capsule Take 1 capsule (0.4 mg total) by mouth daily. 90 capsule 3  . terbinafine (LAMISIL) 250 MG tablet Take 1 tablet (250 mg total) by mouth every other day. 30 tablet 0  . venlafaxine (EFFEXOR) 50 MG tablet Take 1 tablet (50 mg total) by mouth 2 (two) times daily. 180 tablet 1  . Blood Pressure Monitoring (BLOOD PRESSURE CUFF) MISC 1 Device by Does not apply route once for 1 dose. 1 each 0   No current facility-administered medications on file prior to visit.     Allergies  Allergen Reactions  . Betadine [Povidone Iodine] Rash    Family History  Problem Relation Age of Onset  . Arthritis Mother   . Depression Mother   . Heart disease Mother   . Arthritis Father   . COPD Father   . Diabetes Father   . Hyperlipidemia Father   . Hypertension Father   .  Kidney disease Father   . Thyroid disease Father     BP 110/72 (BP Location: Left Arm, Patient Position: Sitting, Cuff Size: Normal)   Pulse (!) 119   Ht 6' 2" (1.88 m)   Wt 193 lb 3.2 oz (87.6 kg)   SpO2 98%   BMI 24.81 kg/m   Review of Systems Denies neck pain.      Objective:   Physical Exam VITAL SIGNS:  See vs page GENERAL: no distress NECK: There is no palpable thyroid enlargement.  No thyroid nodule is palpable.  No palpable lymphadenopathy at the anterior neck.    Lab Results  Component Value Date   TSH 1.36 10/28/2018      Assessment & Plan:  Hypothyroidism: well-replaced MNG: all he needs now is annual phys exam Hyperprolactinemia, new to me: recheck today

## 2018-10-29 LAB — PROLACTIN: Prolactin: 16 ng/mL (ref 2.0–18.0)

## 2018-10-31 ENCOUNTER — Telehealth: Payer: Self-pay

## 2018-10-31 NOTE — Telephone Encounter (Signed)
Called patient no answer,Left message on personal voice mail I received a message you need appointment with Dr.[name].Advised to call me back to schedule.

## 2018-11-05 NOTE — Progress Notes (Signed)
Pt mailed in test results, has been having issues with short term memory, CT in 2019 he stated was normal he was trying to get results of that test, pt stated that he has seen an ENT his brother has a rare nasal CA and that has him nervus. Pt also stated that he has been having black out spells he has not falling he is near a wall or something when he feels them about to happen,

## 2018-11-06 ENCOUNTER — Telehealth (INDEPENDENT_AMBULATORY_CARE_PROVIDER_SITE_OTHER): Payer: Medicare Other | Admitting: Neurology

## 2018-11-06 ENCOUNTER — Other Ambulatory Visit: Payer: Self-pay

## 2018-11-06 VITALS — BP 122/77 | HR 80

## 2018-11-06 DIAGNOSIS — R55 Syncope and collapse: Secondary | ICD-10-CM

## 2018-11-06 DIAGNOSIS — H9190 Unspecified hearing loss, unspecified ear: Secondary | ICD-10-CM

## 2018-11-06 NOTE — Progress Notes (Signed)
Virtual Visit via Video Note The purpose of this virtual visit is to provide medical care while limiting exposure to the novel coronavirus.    Consent was obtained for video visit:  Yes.   Answered questions that patient had about telehealth interaction:  Yes.   I discussed the limitations, risks, security and privacy concerns of performing an evaluation and management service by telemedicine. I also discussed with the patient that there may be a patient responsible charge related to this service. The patient expressed understanding and agreed to proceed.  Pt location: Home Physician Location: office Name of referring provider:  [name], MD I connected with Mark Strong at patients initiation/request on 11/06/2018 at  9:30 AM EDT by video enabled telemedicine application and verified that I am speaking with the correct person using two identifiers. Pt MRN:  [id number] Pt DOB:  01-08-1971 Video Participants:  Mark Strong   History of Present Illness:  This is a 48 year old right-handed man with a history of sick sinus syndrome s/p pacemaker, hyperlipidemia, anxiety, presenting for evaluation of hearing changes and near syncopal episodes. He reports unusual hearing changes that started around 3 years ago. He would be having a conversation and can hear the other person fine, but when he starts talking, his voice would suddenly sound muffled in both ears. He is able to hear his voice but it would sound muffled, then the person who is talking to would sound fine. It occurs around once a week lasting 5-15 minutes. He states it drives him crazy, he would try popping his ears. There is no clear trigger. He denies any associated headache, dizziness, tinnitus, hearing loss, focal numbness/tingling/weakness. He denies any prior intake of offending medications prior to onset of symptoms (antibiotics, loop diuretics, significant use of aspirin/ibuprofen). He had seen an ENT in [facility] and  locally and has been told his hearing his fine. He had also seen a neurologist in [place], records reviewed, he had a normal MRI brain with and without contrast with attention to the sella turcica in 01/2016. He had a normal auditory brainstem response test in 07/2016. Sleep study in 07/2016 showed moderate OSA. Neurology notes also indicate normal TCD, carotid dopplers, NTX (decreased working memory), and EEG. He recalls having a 24-hour EEG 3-5 years ago. He has been also diagnosed with memory loss and had memory testing in [place]. He saw ENT Dr. Ezzard Standing recently and was told it is an "issue between his brain and ears." For the past year, he has had near daily episodes where he feels like he will pass out. He used to have them prior to his pacemaker placement, but these quieted down until a year ago. They always occur when he is standing or walking, never when sitting or supine. He would suddenly feel like his vision goes dark/tunnel vision and like he will pass out. This lasts 5-10 minutes where he has to hold on. There is no associated chest pain, cold/clammy sensation, diaphoresis, palpitations. Both legs feel a little weak. He has not completely lost consciousness. They do not occur concurrently with the muffled hearing episodes. He has been seeing cardiologist Dr. Royann Shivers and amlodipine was discontinued. He reports BP is better, but he continues to have the near syncopal episodes. He denies any staring/unresponsive episodes, gaps in time. He was told that as a child he would have unresponsive episodes, but none in adulthood. He was previously on gabapentin for restless legs and back pain, his legs have not bothered him  much since varicose vein treatment, he stopped gabapentin a few months ago when he ran out. He has been on Effexor  BID for the past year.   PAST MEDICAL HISTORY: Past Medical History:  Diagnosis Date  . Memory loss   . Pacemaker   . PVNS (pigmented villonodular synovitis)   . Sciatica   .  Sleep apnea   . Thyroid disease   . Varicose veins of right leg with edema     PAST SURGICAL HISTORY: Past Surgical History:  Procedure Laterality Date  . CARDIAC CATHETERIZATION     left and right   . INSERT / REPLACE / REMOVE PACEMAKER     Biotronik, dual-chamber, 05/18/2016 for SSS  . Right greater saphenous vein ablation Right     MEDICATIONS: Current Outpatient Medications on File Prior to Visit  Medication Sig Dispense Refill  . atorvastatin (LIPITOR) 10 MG tablet Take 1 tablet (10 mg total) by mouth daily at 6 PM. 90 tablet 3  . BELBUCA 75 MCG FILM Take 1 Film by mouth 2 (two) times daily.  0  . Blood Pressure Monitoring (BLOOD PRESSURE CUFF) MISC 1 Device by Does not apply route once for 1 dose. 1 each 0  . Docusate Sodium (COLACE PO) Take by mouth.    . levothyroxine (SYNTHROID) 75 MCG tablet Take 1 tablet (75 mcg total) by mouth daily before breakfast. 90 tablet 3  . meloxicam (MOBIC) 15 MG tablet Take 1 tablet (15 mg total) by mouth daily. (Patient not taking: Reported on 11/05/2018) 30 tablet 1  . Multiple Vitamin (MULTIVITAMIN) capsule Take 1 capsule by mouth daily.    . tadalafil (CIALIS) 10 MG tablet Take 1 tablet (10 mg total) by mouth every other day as needed for erectile dysfunction. 30 tablet 1  . tamsulosin (FLOMAX) 0.4 MG CAPS capsule Take 1 capsule (0.4 mg total) by mouth daily. 90 capsule 3  . terbinafine (LAMISIL) 250 MG tablet Take 1 tablet (250 mg total) by mouth every other day. 30 tablet 0  . venlafaxine (EFFEXOR) 50 MG tablet Take 1 tablet (50 mg total) by mouth 2 (two) times daily. 180 tablet 1   No current facility-administered medications on file prior to visit.     ALLERGIES: Allergies  Allergen Reactions  . Betadine [Povidone Iodine] Rash    FAMILY HISTORY: Family History  Problem Relation Age of Onset  . Arthritis Mother   . Depression Mother   . Heart disease Mother   . Arthritis Father   . COPD Father   . Diabetes Father   .  Hyperlipidemia Father   . Hypertension Father   . Kidney disease Father   . Thyroid disease Father   . Cancer Brother     Observations/Objective:   Vitals:   11/06/18 1045  BP: 122/77  Pulse: 80   GEN:  The patient appears stated age and is in NAD.  Neurological examination: Patient is awake, alert, oriented x 3. No aphasia or dysarthria. Intact fluency and comprehension. Remote and recent memory intact. Able to name and repeat. Cranial nerves: Extraocular movements intact with no nystagmus. No facial asymmetry. Motor: moves all extremities symmetrically, at least anti-gravity x 4. No incoordination on finger to nose testing. Gait: narrow-based and steady, able to tandem walk adequately. Negative Romberg test.  Assessment and Plan:   This is a 48 year old right-handed man with a history of sick sinus syndrome s/p pacemaker, hyperlipidemia, anxiety, presenting for evaluation of hearing changes and near syncopal episodes.  Pacemaker interrogation has not shown any changes during his near syncope events. No change in symptoms with stopping amlodipine. Etiology unclear, possibly still vasovagal, seizures less likely. He has seen Neurology in the past and has had an MRI brain and routine EEG. A 72-hour EEG will be ordered to further classify his symptoms. The hearing changes are quite unusual where it is only his voice that sounds muffled but normal when the other person speaks. Etiology unclear, possibly eustachian tube dysfunction, however he has been evaluated by several ENTs in the past. CT temporal bone may provide additional information, however we discussed that with all the tests done, there may not be a clear cause for his symptoms. He will follow-up in 4-5 months and knows to call for any changes.   Follow Up Instructions:   -I discussed the assessment and treatment plan with the patient. The patient was provided an opportunity to ask questions and all were answered. The patient agreed with  the plan and demonstrated an understanding of the instructions.   The patient was advised to call back or seek an in-person evaluation if the symptoms worsen or if the condition fails to improve as anticipated.   Van ClinesKaren M Zoiey Christy, MD

## 2018-11-07 ENCOUNTER — Ambulatory Visit (HOSPITAL_COMMUNITY): Payer: Medicare Other

## 2018-11-11 ENCOUNTER — Other Ambulatory Visit: Payer: Self-pay

## 2018-11-11 ENCOUNTER — Other Ambulatory Visit: Payer: Self-pay | Admitting: Neurology

## 2018-11-11 ENCOUNTER — Ambulatory Visit (HOSPITAL_COMMUNITY)
Admission: RE | Admit: 2018-11-11 | Discharge: 2018-11-11 | Disposition: A | Discharge status: Home / Self Care | Payer: Medicare Other | Source: Ambulatory Visit | Attending: Orthopedic Surgery | Admitting: Orthopedic Surgery

## 2018-11-11 DIAGNOSIS — M542 Cervicalgia: Secondary | ICD-10-CM

## 2018-11-11 DIAGNOSIS — R55 Syncope and collapse: Secondary | ICD-10-CM

## 2018-11-11 DIAGNOSIS — H9190 Unspecified hearing loss, unspecified ear: Secondary | ICD-10-CM

## 2018-11-12 ENCOUNTER — Ambulatory Visit: Payer: Medicare Other | Admitting: Neurology

## 2018-11-14 ENCOUNTER — Other Ambulatory Visit: Payer: Self-pay | Admitting: Family Medicine

## 2018-11-14 DIAGNOSIS — F411 Generalized anxiety disorder: Secondary | ICD-10-CM

## 2018-11-19 ENCOUNTER — Ambulatory Visit (INDEPENDENT_AMBULATORY_CARE_PROVIDER_SITE_OTHER): Payer: Medicare Other | Admitting: *Deleted

## 2018-11-19 DIAGNOSIS — I495 Sick sinus syndrome: Secondary | ICD-10-CM

## 2018-11-20 LAB — CUP PACEART REMOTE DEVICE CHECK
Battery Remaining Percentage: 80 %
Brady Statistic RA Percent Paced: 87 %
Brady Statistic RV Percent Paced: 2 %
Date Time Interrogation Session: 20200604150500
Implantable Lead Implant Date: 20171217
Implantable Lead Implant Date: 20171217
Implantable Lead Location: 753859
Implantable Lead Location: 753860
Implantable Lead Model: 377
Implantable Lead Model: 377
Implantable Lead Serial Number: 49336100
Implantable Lead Serial Number: 49685190
Implantable Pulse Generator Implant Date: 20171217
Lead Channel Impedance Value: 507 Ohm
Lead Channel Impedance Value: 527 Ohm
Lead Channel Pacing Threshold Amplitude: 0.4 V
Lead Channel Pacing Threshold Amplitude: 0.8 V
Lead Channel Pacing Threshold Pulse Width: 0.4 ms
Lead Channel Pacing Threshold Pulse Width: 0.4 ms
Lead Channel Sensing Intrinsic Amplitude: 11.7 mV
Lead Channel Sensing Intrinsic Amplitude: 3.1 mV
Pulse Gen Model: 394929
Pulse Gen Serial Number: 68775417

## 2018-11-25 ENCOUNTER — Telehealth: Payer: Self-pay | Admitting: Neurology

## 2018-11-25 NOTE — Telephone Encounter (Signed)
Pt needs to know if orders were sent to [facility] imaging for the ct scan he has not heard anything please let him know the status

## 2018-11-25 NOTE — Telephone Encounter (Signed)
Pt called back informed that order was placed 11/11/2018 pt advised to call [facility], pt stated that he would call and see if they have him scheduled yet

## 2018-11-27 ENCOUNTER — Other Ambulatory Visit: Payer: Self-pay

## 2018-11-27 ENCOUNTER — Encounter: Payer: Self-pay | Admitting: Podiatry

## 2018-11-27 ENCOUNTER — Ambulatory Visit: Payer: Medicare Other | Admitting: Podiatry

## 2018-11-27 ENCOUNTER — Telehealth: Payer: Self-pay

## 2018-11-27 ENCOUNTER — Telehealth: Payer: Self-pay | Admitting: Neurology

## 2018-11-27 VITALS — Temp 97.2°F

## 2018-11-27 DIAGNOSIS — B351 Tinea unguium: Secondary | ICD-10-CM

## 2018-11-27 DIAGNOSIS — M79676 Pain in unspecified toe(s): Secondary | ICD-10-CM | POA: Diagnosis not present

## 2018-11-27 DIAGNOSIS — L603 Nail dystrophy: Secondary | ICD-10-CM

## 2018-11-27 DIAGNOSIS — H9190 Unspecified hearing loss, unspecified ear: Secondary | ICD-10-CM

## 2018-11-27 MED ORDER — TERBINAFINE HCL 250 MG PO TABS: 250.0000 mg | ORAL_TABLET | Freq: Every day | ORAL | 0 refills | Status: DC

## 2018-11-27 NOTE — Progress Notes (Signed)
He presents today for follow-up of his nail fungus.  He states he is completed 120 days the first round and 1 of the every other day rounds.  He states that they are looking much better he is very happy with the outcome.  Objective: Toenails are slowly resolving from proximal to distal though they are still quite thick and very slow to grow.  No open lesions or wounds are noted.  Pulses remain palpable.  Assessment: Pain limb secondary onychomycosis long-term therapy with Lamisil.  Plan: This will be his second dose of every other day Lamisil therapy and I will follow-up with him in 3 months

## 2018-11-27 NOTE — Telephone Encounter (Signed)
Pt called CT temporal bone with contrast scheduled for  June 19th at 3pm has to be at [place] at 2:45 only liquids 4 hrs before appointment, pt informed of this pt verbalized understanding

## 2018-11-27 NOTE — Telephone Encounter (Signed)
Patient left VM about CAT Scan and is needing to speak with you. Thanks!

## 2018-11-28 ENCOUNTER — Encounter: Payer: Self-pay | Admitting: Cardiology

## 2018-11-28 NOTE — Progress Notes (Signed)
Remote pacemaker transmission.   

## 2018-12-01 ENCOUNTER — Ambulatory Visit (INDEPENDENT_AMBULATORY_CARE_PROVIDER_SITE_OTHER): Payer: Medicare Other | Admitting: Neurology

## 2018-12-01 ENCOUNTER — Other Ambulatory Visit: Payer: Self-pay

## 2018-12-01 DIAGNOSIS — R55 Syncope and collapse: Secondary | ICD-10-CM

## 2018-12-05 ENCOUNTER — Ambulatory Visit (HOSPITAL_COMMUNITY)
Admission: RE | Admit: 2018-12-05 | Discharge: 2018-12-05 | Disposition: A | Discharge status: Home / Self Care | Payer: Medicare Other | Source: Ambulatory Visit | Attending: Neurology | Admitting: Neurology

## 2018-12-05 ENCOUNTER — Other Ambulatory Visit: Payer: Self-pay

## 2018-12-05 DIAGNOSIS — H9190 Unspecified hearing loss, unspecified ear: Secondary | ICD-10-CM | POA: Insufficient documentation

## 2018-12-05 MED ORDER — IOHEXOL 300 MG/ML  SOLN
75.0000 mL | Freq: Once | INTRAMUSCULAR | Status: AC | PRN
  Administered 2018-12-05: 15:00:00 75 mL via INTRAVENOUS

## 2018-12-10 ENCOUNTER — Telehealth: Payer: Self-pay | Admitting: Cardiovascular Disease

## 2018-12-10 NOTE — Telephone Encounter (Signed)
Follow up ° ° °Patient is returning your call. Please call. ° ° ° °

## 2018-12-11 ENCOUNTER — Telehealth: Payer: Self-pay | Admitting: Neurology

## 2018-12-11 NOTE — Telephone Encounter (Signed)
Pt called informed that results are not in yet and that I will call him as soon as I get his results.

## 2018-12-11 NOTE — Telephone Encounter (Signed)
Patient wants the results of his test please call

## 2018-12-15 ENCOUNTER — Other Ambulatory Visit: Payer: Self-pay | Admitting: Podiatry

## 2018-12-15 ENCOUNTER — Telehealth: Payer: Self-pay | Admitting: Podiatry

## 2018-12-15 NOTE — Telephone Encounter (Signed)
Patient had refill sent in to pharmacy on 6/11 for #30 lamisil-called patient, said pharmacy didn't have a prescription for him. As we were talking on the phone he found his Rx that he picked up on 6/11.

## 2018-12-15 NOTE — Telephone Encounter (Signed)
Pt requesting a refill of his terbinafine. States he only has two left. [place]  [phone].

## 2018-12-16 NOTE — Procedures (Addendum)
ELECTROENCEPHALOGRAM REPORT  Dates of Recording: 12/01/2018 10:50AM to 12/04/2018 10:57AM  Patient's Name: Mark Strong MRN: [id number] Date of Birth: 1970/07/16  Referring Provider: Dr. Ellouise Newer  Procedure: 72-hour ambulatory EEG  History: This is a 48 year old man with recurrent episodes where he would suddenly feel like his vision goes dark/tunnel vision and like he will pass out. Pacemaker interrogation was normal. EEG for classification.   Medications: venlafaxine, Belbuca, Synthroid, Lipitor  Technical Summary: This is a 72-hour multichannel digital video EEG recording measured by the international 10-20 system with electrodes applied with paste and impedances below 5000 ohms performed as portable with EKG monitoring.  The digital EEG was referentially recorded, reformatted, and digitally filtered in a variety of bipolar and referential montages for optimal display.    DESCRIPTION OF RECORDING: During maximal wakefulness, the background activity consisted of a symmetric 9 Hz posterior dominant rhythm which was reactive to eye opening. Glossokinetic artifact is seen when patient is talking and chewing. There were no epileptiform discharges or focal slowing seen in wakefulness.  During the recording, the patient progresses through wakefulness, drowsiness, and Stage 2 sleep.  Again, there were no epileptiform discharges seen.  Events: There were no push button events. Typical events were not captured.   There were no electrographic seizures seen.  EKG lead was unremarkable.  IMPRESSION: This 72-hour ambulatory video EEG study is normal.    CLINICAL CORRELATION: A normal EEG does not exclude a clinical diagnosis of epilepsy. Typical events were not captured.  If further clinical questions remain, inpatient video EEG monitoring may be helpful.   Ellouise Newer, M.D.

## 2018-12-16 NOTE — Telephone Encounter (Signed)
Patient left msg with after hours about returning a call. Thanks!

## 2018-12-17 ENCOUNTER — Telehealth: Payer: Self-pay | Admitting: Cardiovascular Disease

## 2018-12-17 NOTE — Telephone Encounter (Signed)
Pt is returning Ashley call  

## 2018-12-17 NOTE — Telephone Encounter (Signed)
1. 3. Confirm Cosent - "In the setting of the current Covid19 crisis, you are scheduled for a (phone or video) visit with your provider on (date) at (time).  Just as we do with many in-office visits, in order for you to participate in this visit, we must obtain consent.  If you'd like, I can send this to your mychart (if signed up) or email for you to review.  Otherwise, I can obtain your verbal consent now.  All virtual visits are billed to your insurance company just like a normal visit would be.  By agreeing to a virtual visit, we'd like you to understand that the technology does not allow for your provider to perform an examination, and thus may limit your provider's ability to fully assess your condition. If your provider identifies any concerns that need to be evaluated in person, we will make arrangements to do so.  Finally, though the technology is pretty good, we cannot assure that it will always work on either your or our end, and in the setting of a video visit, we may have to convert it to a phone-only visit.  In either situation, we cannot ensure that we have a secure connection.  Are you willing to proceed?" STAFF: Did the patient verbally acknowledge consent to telehealth visit? Document YES/NO here: Yes  ONE CALL NOTE  FULL LENGTH CONSENT FOR TELE-HEALTH VISIT   I hereby voluntarily request, consent and authorize [facility] and its employed or contracted physicians, physician assistants, nurse practitioners or other licensed health care professionals (the Practitioner), to provide me with telemedicine health care services (the Services") as deemed necessary by the treating Practitioner. I acknowledge and consent to receive the Services by the Practitioner via telemedicine. I understand that the telemedicine visit will involve communicating with the Practitioner through live audiovisual communication technology and the disclosure of certain medical information by electronic  transmission. I acknowledge that I have been given the opportunity to request an in-person assessment or other available alternative prior to the telemedicine visit and am voluntarily participating in the telemedicine visit.  I understand that I have the right to withhold or withdraw my consent to the use of telemedicine in the course of my care at any time, without affecting my right to future care or treatment, and that the Practitioner or I may terminate the telemedicine visit at any time. I understand that I have the right to inspect all information obtained and/or recorded in the course of the telemedicine visit and may receive copies of available information for a reasonable fee.  I understand that some of the potential risks of receiving the Services via telemedicine include:   Delay or interruption in medical evaluation due to technological equipment failure or disruption;  Information transmitted may not be sufficient (e.g. poor resolution of images) to allow for appropriate medical decision making by the Practitioner; and/or   In rare instances, security protocols could fail, causing a breach of personal health information.  Furthermore, I acknowledge that it is my responsibility to provide information about my medical history, conditions and care that is complete and accurate to the best of my ability. I acknowledge that Practitioner's advice, recommendations, and/or decision may be based on factors not within their control, such as incomplete or inaccurate data provided by me or distortions of diagnostic images or specimens that may result from electronic transmissions. I understand that the practice of medicine is not an exact science and that Practitioner makes no warranties or guarantees  regarding treatment outcomes. I acknowledge that I will receive a copy of this consent concurrently upon execution via email to the email address I last provided but may also request a printed copy by  calling the office of [facility].    I understand that my insurance will be billed for this visit.   I have read or had this consent read to me.  I understand the contents of this consent, which adequately explains the benefits and risks of the Services being provided via telemedicine.   I have been provided ample opportunity to ask questions regarding this consent and the Services and have had my questions answered to my satisfaction.  I give my informed consent for the services to be provided through the use of telemedicine in my medical care  By participating in this telemedicine visit I agree to the above.

## 2018-12-18 ENCOUNTER — Telehealth (INDEPENDENT_AMBULATORY_CARE_PROVIDER_SITE_OTHER): Payer: Medicare Other | Admitting: Cardiovascular Disease

## 2018-12-18 ENCOUNTER — Encounter: Payer: Self-pay | Admitting: Cardiovascular Disease

## 2018-12-18 VITALS — Ht 74.0 in | Wt 171.0 lb

## 2018-12-18 DIAGNOSIS — Z950 Presence of cardiac pacemaker: Secondary | ICD-10-CM

## 2018-12-18 DIAGNOSIS — R55 Syncope and collapse: Secondary | ICD-10-CM | POA: Diagnosis not present

## 2018-12-18 DIAGNOSIS — I495 Sick sinus syndrome: Secondary | ICD-10-CM

## 2018-12-18 DIAGNOSIS — E7800 Pure hypercholesterolemia, unspecified: Secondary | ICD-10-CM | POA: Diagnosis not present

## 2018-12-18 DIAGNOSIS — I484 Atypical atrial flutter: Secondary | ICD-10-CM

## 2018-12-18 NOTE — Patient Instructions (Signed)

## 2018-12-18 NOTE — Progress Notes (Signed)
Virtual Visit via Video Note   This visit type was conducted due to national recommendations for restrictions regarding the COVID-19 Pandemic (e.g. social distancing) in an effort to limit this patient's exposure and mitigate transmission in our community.  Due to his co-morbid illnesses, this patient is at least at moderate risk for complications without adequate follow up.  This format is felt to be most appropriate for this patient at this time.  All issues noted in this document were discussed and addressed.  A limited physical exam was performed with this format.  Please refer to the patient's chart for his consent to telehealth for [facility].   Evaluation Performed:  Follow-up visit  Date:  12/18/2018   ID:  Mark Fogohristopher Santo, DOB 06/07/1971, MRN [id number]  Patient Location: Home Provider Location: Home  PCP:  Shade FloodGreene, Jeffrey R, MD  Cardiologist:  Akaylah Lalley Electrophysiologist:  None   Chief Complaint:  Near syncope/SSS  History of Present Illness:    Mark Strong is a 48 y.o. male with remarkably of early onset sinus node dysfunction and a normally functioning dual-chamber permanent pacemaker.  Mark Strong was having occasional episodes of near syncope with a pattern suggesting orthostatic hypotension, but after increasing his intake of fluids he has no longer experienced any more of those spells.  He continues to exercise avidly and has made excellent changes in his diet and lifestyle.  He has lost substantial more weight and now has a body mass index of only 22.  He feels very fit and strong.  The patient specifically denies any chest pain at rest exertion, dyspnea at rest or with exertion, orthopnea, paroxysmal nocturnal dyspnea, syncope, palpitations, focal neurological deficits, intermittent claudication, lower extremity edema, unexplained weight gain, cough, hemoptysis or wheezing.  His pacemaker does show an episode of atrial flutter with controlled ventricular  response that occurred on March 17 at about 2:30 PM and lasted for about 5-1/2 minutes.  His most recent pacemaker download on June 4 does not show any new episodes of atrial flutter or any other arrhythmia.Battery status is 80%.  Lead parameters are excellent. As in the past he has almost 90% atrial pacing (with good heart rate histogram distribution) and only 1-2% ventricular pacing.  The patient does not have symptoms concerning for COVID-19 infection (fever, chills, cough, or new shortness of breath).    Past Medical History:  Diagnosis Date  . Memory loss   . Pacemaker   . PVNS (pigmented villonodular synovitis)   . Sciatica   . Sleep apnea   . Thyroid disease   . Varicose veins of right leg with edema    Past Surgical History:  Procedure Laterality Date  . CARDIAC CATHETERIZATION     left and right   . INSERT / REPLACE / REMOVE PACEMAKER     Biotronik, dual-chamber, 05/18/2016 for SSS  . Right greater saphenous vein ablation Right      Current Meds  Medication Sig  . atorvastatin (LIPITOR) 10 MG tablet Take 1 tablet (10 mg total) by mouth daily at 6 PM.  . BELBUCA 75 MCG FILM Take 1 Film by mouth 2 (two) times daily.  Tery Sanfilippo. Docusate Sodium (COLACE PO) Take by mouth.  . levothyroxine (SYNTHROID) 75 MCG tablet Take 1 tablet (75 mcg total) by mouth daily before breakfast.  . meloxicam (MOBIC) 15 MG tablet Take 1 tablet (15 mg total) by mouth daily.  . Multiple Vitamin (MULTIVITAMIN) capsule Take 1 capsule by mouth daily.  . tadalafil (CIALIS) 10 MG  tablet Take 1 tablet (10 mg total) by mouth every other day as needed for erectile dysfunction.  . tamsulosin (FLOMAX) 0.4 MG CAPS capsule Take 1 capsule (0.4 mg total) by mouth daily.  Marland Kitchen. terbinafine (LAMISIL) 250 MG tablet Take 1 tablet (250 mg total) by mouth daily.  Marland Kitchen. venlafaxine (EFFEXOR) 50 MG tablet Take 1 tablet (50 mg total) by mouth 2 (two) times daily.     Allergies:   Betadine [povidone iodine]   Social History    Tobacco Use  . Smoking status: Never Smoker  . Smokeless tobacco: Never Used  Substance Use Topics  . Alcohol use: Not Currently  . Drug use: Not Currently     Family Hx: The patient's family history includes Arthritis in his father and mother; COPD in his father; Cancer in his brother; Depression in his mother; Diabetes in his father; Heart disease in his mother; Hyperlipidemia in his father; Hypertension in his father; Kidney disease in his father; Thyroid disease in his father.  ROS:   Please see the history of present illness.    All other systems are reviewed and are negative  Prior CV studies:   The following studies were reviewed today: Pacemaker download June 4  Labs/Other Tests and Data Reviewed:    EKG:  An ECG dated 08/19/2018 was personally reviewed today and demonstrated:  Normal sinus rhythm, normal tracing.  No new tracing since then.  Recent Labs: 08/26/2018: ALT 12; BUN 11; Creatinine, Ser 1.14; Potassium 4.6; Sodium 144 10/28/2018: TSH 1.36   Recent Lipid Panel Lab Results  Component Value Date/Time   CHOL 155 08/26/2018 04:41 PM   TRIG 66 08/26/2018 04:41 PM   HDL 68 08/26/2018 04:41 PM   CHOLHDL 2.3 08/26/2018 04:41 PM   LDLCALC 74 08/26/2018 04:41 PM    Wt Readings from Last 3 Encounters:  12/18/18 171 lb (77.6 kg)  11/05/18 184 lb 8 oz (83.7 kg)  10/28/18 193 lb 3.2 oz (87.6 kg)     Objective:    Vital Signs:  Ht 6\' 2"  (1.88 m)   Wt 171 lb (77.6 kg)   BMI 21.96 kg/m    VITAL SIGNS:  reviewed GEN:  no acute distress EYES:  sclerae anicteric, EOMI - Extraocular Movements Intact RESPIRATORY:  normal respiratory effort, symmetric expansion CARDIOVASCULAR:  no peripheral edema SKIN:  no rash, lesions or ulcers. MUSCULOSKELETAL:  no obvious deformities. NEURO:  alert and oriented x 3, no obvious focal deficit PSYCH:  normal affect  ASSESSMENT & PLAN:    1. Near-syncope: Resolved with better hydration. 2. Atrial flutter: A single 5-minute  event was recorded in March.  No recurrence since.  The episode was asymptomatic and he is at very low embolic risk.  Anticoagulation is not indicated, neither are antiarrhythmics. 3. SSS: Current pacemaker settings appear appropriate, he does not have signs or symptoms of chronotropic incompetence. 4. PM: Normal device function.  Continue remote downloads every 3 months and yearly office visit. 5. HLP: Excellent HDL cholesterol is a tribute to his exercise and weight loss, LDL cholesterol is great on statin.  COVID-19 Education: The signs and symptoms of COVID-19 were discussed with the patient and how to seek care for testing (follow up with PCP or arrange E-visit).  The importance of social distancing was discussed today.  Time:   Today, I have spent 21 minutes with the patient with telehealth technology discussing the above problems.     Medication Adjustments/Labs and Tests Ordered: Current medicines are reviewed at  length with the patient today.  Concerns regarding medicines are outlined above.   Tests Ordered: No orders of the defined types were placed in this encounter.   Medication Changes: No orders of the defined types were placed in this encounter.   Disposition:  Follow up December 18, 2018  Signed, Sanda Klein, MD  12/18/2018 8:33 AM    [facility]

## 2019-01-21 ENCOUNTER — Other Ambulatory Visit: Payer: Self-pay

## 2019-01-21 ENCOUNTER — Ambulatory Visit (INDEPENDENT_AMBULATORY_CARE_PROVIDER_SITE_OTHER): Payer: Medicare Other | Admitting: Family Medicine

## 2019-01-21 ENCOUNTER — Encounter: Payer: Self-pay | Admitting: Family Medicine

## 2019-01-21 VITALS — BP 101/67 | HR 93 | Temp 97.3°F | Resp 12 | Wt 175.6 lb

## 2019-01-21 DIAGNOSIS — Z23 Encounter for immunization: Secondary | ICD-10-CM | POA: Diagnosis not present

## 2019-01-21 DIAGNOSIS — I8393 Asymptomatic varicose veins of bilateral lower extremities: Secondary | ICD-10-CM

## 2019-01-21 DIAGNOSIS — M542 Cervicalgia: Secondary | ICD-10-CM

## 2019-01-21 NOTE — Progress Notes (Signed)
Subjective:    Patient ID: Mark Strong, male    DOB: 1970/11/07, 48 y.o.   MRN: [id number]  HPI Mark Strong is a 48 y.o. male Presents today for: Chief Complaint  Patient presents with  . Neck Pain    patient has been seen by Ortho and states he has cervial spinal compression and would like to discuss with Dr Neva Seat  . Varicose Veins    patient also need a referral to see an vascular surgeon for varicose vein   Neck pain:  Evaluated by preferred pain mgt, and ortho (Dr. Yevette Edwards).  Cervical spinal compression, had EMG and MRI. Dr. Yevette Edwards recommended surgery. Concerned and wants second opinion.   Reviewed pt's copies of studies: EMG 12/30/18 - Dr. Jordan Likes. bilaterral UE cervical radiculopathic changes - particularly at C5-6 level. Focal demyelinating sensorimotor lesion at median nerve near wrist.  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  Bilateral foraminal narrowing observed from C3-C7 worse on the right at C3-4 C4-5 C6-7 worse on the left at C5-6  Minimal improvement with steroid injection at neck.  Has appt with Dr. Yevette Edwards Friday to discuss prior injection and to decide if surgery needed. Dr. Jordan Likes deferred further injections until discuss with Dr. Yevette Edwards.   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 Pain with any activity.   Varicose veins: History of varicose veins.  Treated in 2018 - L and R  leg, phlebectomy, laser ablation - other procedures in [place]. Has been wearing compression stockings (fitted when over 200 lbs, now lost weight). Noticed more varicose veins past few weeks with standing, even with using compression stockings.      Patient Active Problem List   Diagnosis Date Noted  . Near syncope 12/18/2018  . Atypical atrial flutter (HCC) 12/18/2018  . Hyperprolactinemia (HCC) 10/28/2018  .  Hypercholesterolemia 10/15/2018  . SSS (sick sinus syndrome) (HCC) 08/20/2018  . Pacemaker 05/28/2018  . OSA (obstructive sleep apnea) 05/28/2018  . 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 Hypothyroidism 04/28/2018  . Right thyroid nodule 04/28/2018  . Pain of left hip joint 04/17/2018  . 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 Chronic back pain 03/14/2018  . Bradycardia 03/14/2018   Past Medical History:  Diagnosis Date  . Memory loss   . Pacemaker   . PVNS (pigmented villonodular synovitis)   . Sciatica   . Sleep apnea   . Thyroid disease   . Varicose veins of right leg with edema    Past Surgical History:  Procedure Laterality Date  . CARDIAC CATHETERIZATION     left and right   . INSERT / REPLACE / REMOVE PACEMAKER     Biotronik, dual-chamber, 05/18/2016 for SSS  . Right greater saphenous vein ablation Right    Allergies  Allergen Reactions  . Betadine [Povidone Iodine] Rash   Prior to Admission medications   Medication Sig Start Date End Date Taking? Authorizing Provider  BELBUCA 75 MCG FILM Take 1 Film by mouth 2 (two) times daily. 150 mcg starting Jan 31, 2019 05/23/18  Yes [provider]  Docusate Sodium (COLACE PO) Take by mouth.   Yes [provider]  levothyroxine (SYNTHROID) 75 MCG tablet Take 1 tablet (75 mcg total) by mouth daily before breakfast. 04/28/18  Yes Romero Belling, MD  linaclotide [facility]) 145 MCG CAPS capsule Take  145 mcg by mouth daily before breakfast.   Yes [provider]  meloxicam (MOBIC) 15 MG tablet Take 1 tablet (15 mg total) by mouth daily. 04/14/18  Yes Shade FloodGreene, Amahri Dengel R, MD  Multiple Vitamin (MULTIVITAMIN) capsule Take 1 capsule by mouth daily.   Yes [provider]  tadalafil (CIALIS) 10 MG tablet Take 1 tablet (10 mg total) by mouth every other day as needed for erectile dysfunction. 04/14/18  Yes Shade FloodGreene, Merek Niu R, MD  tamsulosin (FLOMAX) 0.4  MG CAPS capsule Take 1 capsule (0.4 mg total) by mouth daily. 09/04/18  Yes Corum, Minerva FesterLisa L, MD  terbinafine (LAMISIL) 250 MG tablet Take 1 tablet (250 mg total) by mouth daily. 11/27/18  Yes Hyatt, Max T, DPM  venlafaxine (EFFEXOR) 50 MG tablet Take 1 tablet (50 mg total) by mouth 2 (two) times daily. 08/26/18  Yes Shade FloodGreene, Armand Preast R, MD  atorvastatin (LIPITOR) 10 MG tablet Take 1 tablet (10 mg total) by mouth daily at 6 PM. 09/04/18   Corum, Minerva FesterLisa L, MD  Blood Pressure Monitoring (BLOOD PRESSURE CUFF) MISC 1 Device by Does not apply route once for 1 dose. 10/15/18 10/15/18  [name], Rachelle HoraMihai, MD   Social History   Socioeconomic History  . Marital status: Single    Spouse name: Not on file  . Number of children: 0  . Years of education: 7312  . Highest education level: 12th grade  Occupational History  . Not on file  Social Needs  . Financial resource strain: Not on file  . Food insecurity    Worry: Not on file    Inability: Not on file  . Transportation needs    Medical: Not on file    Non-medical: Not on file  Tobacco Use  . Smoking status: Never Smoker  . Smokeless tobacco: Never Used  Substance and Sexual Activity  . Alcohol use: Not Currently  . Drug use: Not Currently  . Sexual activity: Yes  Lifestyle  . Physical activity    Days per week: Not on file    Minutes per session: Not on file  . Stress: Not on file  Relationships  . Social Musicianconnections    Talks on phone: Not on file    Gets together: Not on file    Attends religious service: Not on file    Active member of club or organization: Not on file    Attends meetings of clubs or organizations: Not on file    Relationship status: Not on file  . Intimate partner violence    Fear of current or ex partner: Not on file    Emotionally abused: Not on file    Physically abused: Not on file    Forced sexual activity: Not on file  Other Topics Concern  . Not on file  Social History Narrative   Right handed    Review of  Systems Per HPI.     Objective:   Physical Exam Vitals signs reviewed.  Constitutional:      General: He is not in acute distress.    Appearance: He is well-developed.  HENT:     Head: Normocephalic and atraumatic.  Cardiovascular:     Rate and Rhythm: Normal rate.  Pulmonary:     Effort: Pulmonary effort is normal.  Musculoskeletal:     Cervical back: He exhibits decreased range of motion (Decreased range of motion with some discomfort in the neck during range of motion.).  Skin:      Neurological:  Mental Status: He is alert and oriented to person, place, and time.     Deep Tendon Reflexes:     Reflex Scores:      Tricep reflexes are 2+ on the right side and 1+ on the left side.      Bicep reflexes are 2+ on the right side and 2+ on the left side.      Brachioradialis reflexes are 2+ on the right side and 2+ on the left side.    Comments: Appears to have decreased tricep reflex on left versus right, grossly strength was equal on upper extremity resistance testing.    Vitals:   01/21/19 1139  BP: 101/67  Pulse: 93  Resp: 12  Temp: (!) 97.3 F (36.3 C)  TempSrc: Oral  SpO2: 98%  Weight: 175 lb 9.6 oz (79.7 kg)          Assessment & Plan:   Mark Strong is a 48 y.o. male Neck pain - Plan:  With radiculopathy as above.  Followed by pain management with EMG results as above as well as orthopedic/spine specialist.  Status post 1 injection with minimal relief, plan for follow-up with his spine specialist later this week to determine next course of action, either potential repeat injections or recommendation for surgery.  Once he has that meeting will send me a my chart message and can certainly send him to a neurosurgeon for a second opinion on the next step.   Asymptomatic varicose veins of both lower extremities - Plan: Ambulatory referral to Vascular Surgery,   -Asymptomatic, but has noted prevalence of veins recently.  Previous treatment as above.   Potentially may need different compressive stockings but will refer to vascular specialist as he requested for evaluation and treatment.  Need for prophylactic vaccination with combined diphtheria-tetanus-pertussis (DTP) vaccine - Plan: Tdap vaccine greater than or equal to 7yo IM given.  No orders of the defined types were placed in this encounter.  Patient Instructions   Follow-up with Dr. Yevette Edwardsumonski as planned then can determine if he recommends a surgery let me know I am happy to refer you to a neurosurgeon or another provider to get a second opinion. Send me a Clinical cytogeneticistmychart message and I can refer you to neurosurgeon for second opinion.   I will refer you to vascular specialist. Continue stockings.    Varicose Veins Varicose veins are veins that have become enlarged, bulged, and twisted. They most often appear in the legs. What are the causes? This condition is caused by damage to the valves in the vein. These valves help blood return to your heart. When they are damaged and they stop working properly, blood may flow backward and back up in the veins near the skin, causing the veins to get larger and appear twisted. The condition can result from any issue that causes blood to back up, like pregnancy, prolonged standing, or obesity. What increases the risk? This condition is more likely to develop in people who are:  On their feet a lot.  Pregnant.  Overweight. What are the signs or symptoms? Symptoms of this condition include:  Bulging, twisted, and bluish veins.  A feeling of heaviness. This may be worse at the end of the day.  Leg pain. This may be worse at the end of the day.  Swelling in the leg.  Changes in skin color over the veins. How is this diagnosed? This condition may be diagnosed based on your symptoms, a physical exam, and an ultrasound test.  How is this treated? Treatment for this condition may involve:  Avoiding sitting or standing in one position for long  periods of time.  Wearing compression stockings. These stockings help to prevent blood clots and reduce swelling in the legs.  Raising (elevating) the legs when resting.  Losing weight.  Exercising regularly. If you have persistent symptoms or want to improve the way your varicose veins look, you may choose to have a procedure to close the varicose veins off or to remove them. Treatments to close off the veins include:  Sclerotherapy. In this treatment, a solution is injected into a vein to close it off.  Laser treatment. In this treatment, the vein is heated with a laser to close it off.  Radiofrequency vein ablation. In this treatment, an electrical current produced by radio waves is used to close off the vein. Treatments to remove the veins include:  Phlebectomy. In this treatment, the veins are removed through small incisions made over the veins.  Vein ligation and stripping. In this treatment, incisions are made over the veins. The veins are then removed after being tied (ligated) with stitches (sutures). Follow these instructions at home: Activity  Walk as much as possible. Walking increases blood flow. This helps blood return to the heart and takes pressure off your veins. It also increases your cardiovascular strength.  Follow your health care provider's instructions about exercising.  Do not stand or sit in one position for a long period of time.  Do not sit with your legs crossed.  Rest with your legs raised during the day. General instructions   Follow any diet instructions given to you by your health care provider.  Wear compression stockings as directed by your health care provider. Do not wear other kinds of tight clothing around your legs, pelvis, or waist.  Elevate your legs at night to above the level of your heart.  If you get a cut in the skin over the varicose vein and the vein bleeds: ? Lie down with your leg raised. ? Apply firm pressure to the cut  with a clean cloth until the bleeding stops. ? Place a bandage (dressing) on the cut. Contact a health care provider if:  The skin around your varicose veins starts to break down.  You have pain, redness, tenderness, or hard swelling over a vein.  You are uncomfortable because of pain.  You get a cut in the skin over a varicose vein and it will not stop bleeding. Summary  Varicose veins are veins that have become enlarged, bulged, and twisted. They most often appear in the legs.  This condition is caused by damage to the valves in the vein. These valves help blood return to your heart.  Treatment for this condition includes frequent movements, wearing compression stockings, losing weight, and exercising regularly. In some cases, procedures are done to close off or remove the veins.  Treatment for this condition may include wearing compression stockings, elevating the legs, losing weight, and engaging in regular activity. In some cases, procedures are done to close off or remove the veins. This information is not intended to replace advice given to you by your health care provider. Make sure you discuss any questions you have with your health care provider. Document Released: 03/14/2005 Document Revised: 07/31/2018 Document Reviewed: 06/27/2016 Elsevier Patient Education  [employer]2020 [employer]      If you have lab work done today you will be contacted with your lab results within the next 2 weeks.  If you have not heard from [place] then please contact us. The fastest way to get your results is to register for My Chart.   IF you received an x-ray today, you will receive an invoice from [facility]. Please contact [facility] at [phone] with questions or concerns regarding your invoice.   IF you received labwork today, you will receive an invoice from [place]. Please contact [facility] at [phone] with questions or concerns regarding your invoice.   Our billing  staff will not be able to assist you with questions regarding bills from these companies.  You will be contacted with the lab results as soon as they are available. The fastest way to get your results is to activate your My Chart account. Instructions are located on the last page of this paperwork. If you have not heard from [place] regarding the results in 2 weeks, please contact this office.        Signed,   Merri Ray, MD [facility].  01/21/19 1:03 PM

## 2019-01-21 NOTE — Patient Instructions (Addendum)
Follow-up with Dr. Lynann Bologna as planned then can determine if he recommends a surgery let me know I am happy to refer you to a neurosurgeon or another provider to get a second opinion. Send me a Pharmacist, community message and I can refer you to neurosurgeon for second opinion.   I will refer you to vascular specialist. Continue stockings.   Varicose Veins Varicose veins are veins that have become enlarged, bulged, and twisted. They most often appear in the legs. What are the causes? This condition is caused by damage to the valves in the vein. These valves help blood return to your heart. When they are damaged and they stop working properly, blood may flow backward and back up in the veins near the skin, causing the veins to get larger and appear twisted. The condition can result from any issue that causes blood to back up, like pregnancy, prolonged standing, or obesity. What increases the risk? This condition is more likely to develop in people who are:  On their feet a lot.  Pregnant.  Overweight. What are the signs or symptoms? Symptoms of this condition include:  Bulging, twisted, and bluish veins.  A feeling of heaviness. This may be worse at the end of the day.  Leg pain. This may be worse at the end of the day.  Swelling in the leg.  Changes in skin color over the veins. How is this diagnosed? This condition may be diagnosed based on your symptoms, a physical exam, and an ultrasound test. How is this treated? Treatment for this condition may involve:  Avoiding sitting or standing in one position for long periods of time.  Wearing compression stockings. These stockings help to prevent blood clots and reduce swelling in the legs.  Raising (elevating) the legs when resting.  Losing weight.  Exercising regularly. If you have persistent symptoms or want to improve the way your varicose veins look, you may choose to have a procedure to close the varicose veins off or to remove  them. Treatments to close off the veins include:  Sclerotherapy. In this treatment, a solution is injected into a vein to close it off.  Laser treatment. In this treatment, the vein is heated with a laser to close it off.  Radiofrequency vein ablation. In this treatment, an electrical current produced by radio waves is used to close off the vein. Treatments to remove the veins include:  Phlebectomy. In this treatment, the veins are removed through small incisions made over the veins.  Vein ligation and stripping. In this treatment, incisions are made over the veins. The veins are then removed after being tied (ligated) with stitches (sutures). Follow these instructions at home: Activity  Walk as much as possible. Walking increases blood flow. This helps blood return to the heart and takes pressure off your veins. It also increases your cardiovascular strength.  Follow your health care provider's instructions about exercising.  Do not stand or sit in one position for a long period of time.  Do not sit with your legs crossed.  Rest with your legs raised during the day. General instructions   Follow any diet instructions given to you by your health care provider.  Wear compression stockings as directed by your health care provider. Do not wear other kinds of tight clothing around your legs, pelvis, or waist.  Elevate your legs at night to above the level of your heart.  If you get a cut in the skin over the varicose vein and the vein  bleeds: ? Lie down with your leg raised. ? Apply firm pressure to the cut with a clean cloth until the bleeding stops. ? Place a bandage (dressing) on the cut. Contact a health care provider if:  The skin around your varicose veins starts to break down.  You have pain, redness, tenderness, or hard swelling over a vein.  You are uncomfortable because of pain.  You get a cut in the skin over a varicose vein and it will not stop  bleeding. Summary  Varicose veins are veins that have become enlarged, bulged, and twisted. They most often appear in the legs.  This condition is caused by damage to the valves in the vein. These valves help blood return to your heart.  Treatment for this condition includes frequent movements, wearing compression stockings, losing weight, and exercising regularly. In some cases, procedures are done to close off or remove the veins.  Treatment for this condition may include wearing compression stockings, elevating the legs, losing weight, and engaging in regular activity. In some cases, procedures are done to close off or remove the veins. This information is not intended to replace advice given to you by your health care provider. Make sure you discuss any questions you have with your health care provider. Document Released: 03/14/2005 Document Revised: 07/31/2018 Document Reviewed: 06/27/2016 Elsevier Patient Education  [employer]2020 [employer]      If you have lab work done today you will be contacted with your lab results within the next 2 weeks.  If you have not heard from us then please contact us. The fastest way to get your results is to register for My Chart.   IF you received an x-ray today, you will receive an invoice from [facility]. Please contact [facility] at [phone] with questions or concerns regarding your invoice.   IF you received labwork today, you will receive an invoice from [facility]. Please contact [facility] at [phone] with questions or concerns regarding your invoice.   Our billing staff will not be able to assist you with questions regarding bills from these companies.  You will be contacted with the lab results as soon as they are available. The fastest way to get your results is to activate your My Chart account. Instructions are located on the last page of this paperwork. If you have not heard from us regarding the results in 2 weeks,  please contact this office.

## 2019-01-23 ENCOUNTER — Encounter: Payer: Self-pay | Admitting: Family Medicine

## 2019-01-23 DIAGNOSIS — M542 Cervicalgia: Secondary | ICD-10-CM

## 2019-01-23 DIAGNOSIS — M5412 Radiculopathy, cervical region: Secondary | ICD-10-CM

## 2019-01-23 DIAGNOSIS — M4802 Spinal stenosis, cervical region: Secondary | ICD-10-CM

## 2019-02-04 ENCOUNTER — Telehealth: Payer: Self-pay | Admitting: *Deleted

## 2019-02-04 NOTE — Telephone Encounter (Signed)
Pt request refill of the terbinafine. 

## 2019-02-04 NOTE — Telephone Encounter (Signed)
I informed pt of Dr. Stephenie Acres LOV statement that he wanted to see pt after 3 months.

## 2019-02-12 ENCOUNTER — Ambulatory Visit: Payer: Medicare Other | Admitting: Podiatry

## 2019-02-12 ENCOUNTER — Encounter: Payer: Self-pay | Admitting: Podiatry

## 2019-02-12 ENCOUNTER — Other Ambulatory Visit: Payer: Self-pay

## 2019-02-12 DIAGNOSIS — L603 Nail dystrophy: Secondary | ICD-10-CM

## 2019-02-12 MED ORDER — TERBINAFINE HCL 250 MG PO TABS: 250.0000 mg | ORAL_TABLET | Freq: Every day | ORAL | 0 refills | Status: DC

## 2019-02-14 ENCOUNTER — Encounter: Payer: Self-pay | Admitting: Podiatry

## 2019-02-14 NOTE — Progress Notes (Signed)
He presents today for follow-up of his nail fungus.  He is completed 2 rounds of every other day therapy which is a total of 60 tablets over 6 months.  He denies fever chills nausea vomiting muscle aches pains itching or rashes with that he states that is growing out and looking much better.  Objective: Vital signs are stable he is alert and oriented x3 all of his nails appear to be healing very nicely with the exception of the hallux left which is very slow to grow it appears to have more of a dystrophy in it than a true fungus.  Though it is clearing and has cleared approximately 4 mm.  Assessment: Long-term therapy with Lamisil for treatment of onychomycosis.  Plan: We will do another 30 tablets 1 every other day and I will follow-up with him in 3 months.

## 2019-02-18 ENCOUNTER — Ambulatory Visit (INDEPENDENT_AMBULATORY_CARE_PROVIDER_SITE_OTHER): Payer: Medicare Other | Admitting: *Deleted

## 2019-02-18 DIAGNOSIS — I495 Sick sinus syndrome: Secondary | ICD-10-CM

## 2019-02-20 LAB — CUP PACEART REMOTE DEVICE CHECK
Date Time Interrogation Session: 20200904073633
Implantable Lead Implant Date: 20171217
Implantable Lead Implant Date: 20171217
Implantable Lead Location: 753859
Implantable Lead Location: 753860
Implantable Lead Model: 377
Implantable Lead Model: 377
Implantable Lead Serial Number: 49336100
Implantable Lead Serial Number: 49685190
Implantable Pulse Generator Implant Date: 20171217
Pulse Gen Model: 394929
Pulse Gen Serial Number: 68775417

## 2019-03-02 ENCOUNTER — Encounter: Payer: Self-pay | Admitting: Family Medicine

## 2019-03-02 ENCOUNTER — Ambulatory Visit (INDEPENDENT_AMBULATORY_CARE_PROVIDER_SITE_OTHER): Payer: Medicare Other | Admitting: Family Medicine

## 2019-03-02 ENCOUNTER — Other Ambulatory Visit: Payer: Self-pay

## 2019-03-02 VITALS — BP 112/70 | HR 86 | Temp 98.2°F | Resp 14 | Wt 170.0 lb

## 2019-03-02 DIAGNOSIS — F411 Generalized anxiety disorder: Secondary | ICD-10-CM

## 2019-03-02 DIAGNOSIS — G894 Chronic pain syndrome: Secondary | ICD-10-CM | POA: Diagnosis not present

## 2019-03-02 DIAGNOSIS — E785 Hyperlipidemia, unspecified: Secondary | ICD-10-CM

## 2019-03-02 DIAGNOSIS — Z9989 Dependence on other enabling machines and devices: Secondary | ICD-10-CM

## 2019-03-02 DIAGNOSIS — G4733 Obstructive sleep apnea (adult) (pediatric): Secondary | ICD-10-CM | POA: Diagnosis not present

## 2019-03-02 LAB — COMPREHENSIVE METABOLIC PANEL
ALT: 13 IU/L (ref 0–44)
AST: 18 IU/L (ref 0–40)
Albumin/Globulin Ratio: 2.5 — ABNORMAL HIGH (ref 1.2–2.2)
Albumin: 4.8 g/dL (ref 4.0–5.0)
Alkaline Phosphatase: 42 IU/L (ref 39–117)
BUN/Creatinine Ratio: 12 (ref 9–20)
BUN: 12 mg/dL (ref 6–24)
Bilirubin Total: 0.5 mg/dL (ref 0.0–1.2)
CO2: 23 mmol/L (ref 20–29)
Calcium: 9.6 mg/dL (ref 8.7–10.2)
Chloride: 103 mmol/L (ref 96–106)
Creatinine, Ser: 1.04 mg/dL (ref 0.76–1.27)
GFR calc Af Amer: 98 mL/min/{1.73_m2} (ref >59)
GFR calc non Af Amer: 85 mL/min/{1.73_m2} (ref >59)
Globulin, Total: 1.9 g/dL (ref 1.5–4.5)
Glucose: 85 mg/dL (ref 65–99)
Potassium: 4.5 mmol/L (ref 3.5–5.2)
Sodium: 140 mmol/L (ref 134–144)
Total Protein: 6.7 g/dL (ref 6.0–8.5)

## 2019-03-02 LAB — LIPID PANEL
Chol/HDL Ratio: 2.3 ratio (ref 0.0–5.0)
Cholesterol, Total: 153 mg/dL (ref 100–199)
HDL: 68 mg/dL (ref >39)
LDL Chol Calc (NIH): 71 mg/dL (ref 0–99)
Triglycerides: 71 mg/dL (ref 0–149)
VLDL Cholesterol Cal: 14 mg/dL (ref 5–40)

## 2019-03-02 MED ORDER — VENLAFAXINE HCL 50 MG PO TABS: 50.0000 mg | ORAL_TABLET | Freq: Two times a day (BID) | ORAL | 1 refills | Status: DC

## 2019-03-02 NOTE — Progress Notes (Signed)
Subjective:    Patient ID: Mark Strong, male    DOB: 10/06/1970, 48 y.o.   MRN: [id number]  HPI Mark Strong is a 48 y.o. male Presents today for: Chief Complaint  Patient presents with  . chronic medical condition    Here for a 6 mo f/u on chroniuc medical issus.   Flu shot declined.   Anxiety: Stable when discussed in March on Effexor 50 mg BID. Doing well. Feels like symptoms controlled - not anxious or depressed.  Depression screen [facility] 2/9 03/02/2019 08/26/2018 07/15/2018 03/14/2018  Decreased Interest 0 0 0 1  Down, Depressed, Hopeless 0 0 0 1  PHQ - 2 Score 0 0 0 2  Altered sleeping - - - 1  Tired, decreased energy - - - 0  Change in appetite - - - 0  Feeling bad or failure about yourself  - - - 0  Trouble concentrating - - - 0  Moving slowly or fidgety/restless - - - 0  Suicidal thoughts - - - 0  PHQ-9 Score - - - 3  Difficult doing work/chores - - - Not difficult at all   Hypertension/Cardiac BP Readings from Last 3 Encounters:  03/02/19 112/70  01/21/19 101/67  11/06/18 122/77   Lab Results  Component Value Date   CREATININE 1.14 08/26/2018  Followed by cardiology for sick sinus syndrome, has pacemaker. Now off antihypertensives. Not needed.  Cardiology- [name], telemed visit in July.   Hypothyroidism: Lab Results  Component Value Date   TSH 1.36 10/28/2018  Followed by endocrinology, currently on Synthroid 75 mcg daily. Endocrine: Ellison  OSA on CPAP: Using CPAP nightly - well rested.  Pulm: Dr. Aldean Ast,  Repeat chest CT scheduled 10/30.   Chronic pain syndrome Followed by pain management, Dr. Jordan Likes, treated with Belbuca, meloxicam.  Is also been meeting with back specialist, the neurosurgeon regarding cervical spine disease. Still considering surgery, not right now.   Erectile dysfunction: Cialis 10 mg as needed.up to every other day - 1/2 pill. No new side effects - no vision/hearing changes or chest pain.   Hyperlipidemia:  Lab Results  Component Value Date   CHOL 155 08/26/2018   HDL 68 08/26/2018   LDLCALC 74 08/26/2018   TRIG 66 08/26/2018   CHOLHDL 2.3 08/26/2018   Lab Results  Component Value Date   ALT 12 08/26/2018   AST 20 08/26/2018   ALKPHOS 45 08/26/2018   BILITOT 0.5 08/26/2018  Lipitor 10 mg daily.  Fasting this am.     Patient Active Problem List   Diagnosis Date Noted  . Near syncope 12/18/2018  . Atypical atrial flutter (HCC) 12/18/2018  . Hyperprolactinemia (HCC) 10/28/2018  . Hypercholesterolemia 10/15/2018  . SSS (sick sinus syndrome) (HCC) 08/20/2018  . Pacemaker 05/28/2018  . OSA (obstructive sleep apnea) 05/28/2018  . 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 Hypothyroidism 04/28/2018  . Right thyroid nodule 04/28/2018  . Pain of left hip joint 04/17/2018  . 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 Chronic back pain 03/14/2018  . Bradycardia 03/14/2018   Past Medical History:  Diagnosis Date  . Memory loss   . Pacemaker   . PVNS (pigmented villonodular synovitis)   . Sciatica   . Sleep apnea   . Thyroid disease   . Varicose veins of right leg with edema    Past Surgical History:  Procedure Laterality Date  . CARDIAC CATHETERIZATION     left and right   .  INSERT / REPLACE / REMOVE PACEMAKER     Biotronik, dual-chamber, 05/18/2016 for SSS  . Right greater saphenous vein ablation Right    Allergies  Allergen Reactions  . Betadine [Povidone Iodine] Rash   Prior to Admission medications   Medication Sig Start Date End Date Taking? Authorizing Provider  atorvastatin (LIPITOR) 10 MG tablet Take 1 tablet (10 mg total) by mouth daily at 6 PM. 09/04/18  Yes Corum, Minerva FesterLisa L, MD  BELBUCA 150 MCG FILM USE 1 FILM Q 12 H UTD 01/20/19  Yes [provider]  BELBUCA 75 MCG FILM Take 1 Film by mouth 2 (two) times daily. 150 mcg starting Jan 31, 2019 05/23/18  Yes [provider]  Docusate Sodium (COLACE PO)  Take by mouth.   Yes [provider]  levothyroxine (SYNTHROID) 75 MCG tablet Take 1 tablet (75 mcg total) by mouth daily before breakfast. 04/28/18  Yes Romero BellingEllison, Sean, MD  linaclotide [facility](LINZESS) 145 MCG CAPS capsule Take 145 mcg by mouth daily before breakfast.   Yes [provider]  meloxicam (MOBIC) 15 MG tablet Take 1 tablet (15 mg total) by mouth daily. 04/14/18  Yes Shade FloodGreene, Shaylin Blatt R, MD  Multiple Vitamin (MULTIVITAMIN) capsule Take 1 capsule by mouth daily.   Yes [provider]  NASACORT ALLERGY 24HR 55 MCG/ACT AERO nasal inhaler USE 2-3 SPRAYS IN EACH NOSTRIL QD UTD 01/29/19  Yes [provider]  tadalafil (CIALIS) 10 MG tablet Take 1 tablet (10 mg total) by mouth every other day as needed for erectile dysfunction. 04/14/18  Yes Shade FloodGreene, Emmani Lesueur R, MD  tamsulosin (FLOMAX) 0.4 MG CAPS capsule Take 1 capsule (0.4 mg total) by mouth daily. 09/04/18  Yes Corum, Minerva FesterLisa L, MD  terbinafine (LAMISIL) 250 MG tablet Take 1 tablet (250 mg total) by mouth daily. 02/12/19  Yes Hyatt, Max T, DPM  venlafaxine (EFFEXOR) 50 MG tablet Take 1 tablet (50 mg total) by mouth 2 (two) times daily. 08/26/18  Yes Shade FloodGreene, Jaelie Aguilera R, MD  Blood Pressure Monitoring (BLOOD PRESSURE CUFF) MISC 1 Device by Does not apply route once for 1 dose. 10/15/18 10/15/18  [name], Rachelle HoraMihai, MD   Social History   Socioeconomic History  . Marital status: Single    Spouse name: Not on file  . Number of children: 0  . Years of education: 5912  . Highest education level: 12th grade  Occupational History  . Not on file  Social Needs  . Financial resource strain: Not on file  . Food insecurity    Worry: Not on file    Inability: Not on file  . Transportation needs    Medical: Not on file    Non-medical: Not on file  Tobacco Use  . Smoking status: Never Smoker  . Smokeless tobacco: Never Used  Substance and Sexual Activity  . Alcohol use: Not Currently  . Drug use: Not Currently  . Sexual activity:  Yes  Lifestyle  . Physical activity    Days per week: Not on file    Minutes per session: Not on file  . Stress: Not on file  Relationships  . Social Musicianconnections    Talks on phone: Not on file    Gets together: Not on file    Attends religious service: Not on file    Active member of club or organization: Not on file    Attends meetings of clubs or organizations: Not on file    Relationship status: Not on file  . Intimate partner violence  Fear of current or ex partner: Not on file    Emotionally abused: Not on file    Physically abused: Not on file    Forced sexual activity: Not on file  Other Topics Concern  . Not on file  Social History Narrative   Right handed    Review of Systems  Constitutional: Negative for fatigue and unexpected weight change.  Eyes: Negative for visual disturbance.  Respiratory: Negative for cough, chest tightness and shortness of breath.   Cardiovascular: Negative for chest pain, palpitations and leg swelling.  Gastrointestinal: Negative for abdominal pain and blood in stool.  Neurological: Negative for dizziness, light-headedness and headaches.       Objective:   Physical Exam Vitals signs reviewed.  Constitutional:      Appearance: He is well-developed.  HENT:     Head: Normocephalic and atraumatic.  Eyes:     Pupils: Pupils are equal, round, and reactive to light.  Neck:     Vascular: No carotid bruit or JVD.  Cardiovascular:     Rate and Rhythm: Normal rate and regular rhythm.     Heart sounds: Normal heart sounds. No murmur.  Pulmonary:     Effort: Pulmonary effort is normal.     Breath sounds: Normal breath sounds. No rales.  Skin:    General: Skin is warm and dry.  Neurological:     Mental Status: He is alert and oriented to person, place, and time.    Vitals:   03/02/19 0927  BP: 112/70  Pulse: 86  Resp: 14  Temp: 98.2 F (36.8 C)  TempSrc: Oral  SpO2: 99%  Weight: 170 lb (77.1 kg)       Assessment & Plan:    Mark Strong is a 48 y.o. male Hyperlipidemia, unspecified hyperlipidemia type - Plan: Lipid Panel, Comprehensive metabolic panel  -Tolerating Lipitor, continue same, labs pending.  Anxiety state - Plan: venlafaxine (EFFEXOR) 50 MG tablet  -Well-controlled with current dose of Effexor, no changes at this time.  Chronic pain syndrome  -Followed by pain management, still deciding on whether or not to have surgery for cervical symptoms.  Continue routine follow-up with pain management.  OSA on CPAP  -Stable symptoms on CPAP.  Continue same.  History of erectile dysfunction, tolerating Cialis, okay to refill when needed.  Denies any new side effects and risks of medication discussed with understanding expressed.  Meds ordered this encounter  Medications  . venlafaxine (EFFEXOR) 50 MG tablet    Sig: Take 1 tablet (50 mg total) by mouth 2 (two) times daily.    Dispense:  180 tablet    Refill:  1   Patient Instructions       If you have lab work done today you will be contacted with your lab results within the next 2 weeks.  If you have not heard from us then please contact us. The fastest way to get your results is to register for My Chart.   IF you received an x-ray today, you will receive an invoice from [facility]. Please contact [facility] at [phone] with questions or concerns regarding your invoice.   IF you received labwork today, you will receive an invoice from [facility]. Please contact [facility] at [phone] with questions or concerns regarding your invoice.   Our billing staff will not be able to assist you with questions regarding bills from these companies.  You will be contacted with the lab results as soon as they are available. The fastest way  to get your results is to activate your My Chart account. Instructions are located on the last page of this paperwork. If you have not heard from [place] regarding the results in 2 weeks, please  contact this office.       Signed,   Merri Ray, MD [facility].  03/03/19 1:01 PM

## 2019-03-02 NOTE — Patient Instructions (Signed)
° ° ° °  If you have lab work done today you will be contacted with your lab results within the next 2 weeks.  If you have not heard from us then please contact us. The fastest way to get your results is to register for My Chart. ° ° °IF you received an x-ray today, you will receive an invoice from [facility]. Please contact [facility] at [phone] with questions or concerns regarding your invoice.  ° °IF you received labwork today, you will receive an invoice from [facility]. Please contact [facility] at [phone] with questions or concerns regarding your invoice.  ° °Our billing staff will not be able to assist you with questions regarding bills from these companies. ° °You will be contacted with the lab results as soon as they are available. The fastest way to get your results is to activate your My Chart account. Instructions are located on the last page of this paperwork. If you have not heard from us regarding the results in 2 weeks, please contact this office. °  ° ° ° °

## 2019-03-03 ENCOUNTER — Encounter: Payer: Self-pay | Admitting: Family Medicine

## 2019-03-03 DIAGNOSIS — Z130 Encounter for screening for diseases of the blood and blood-forming organs and certain disorders involving the immune mechanism: Secondary | ICD-10-CM

## 2019-03-05 ENCOUNTER — Encounter: Payer: Self-pay | Admitting: Cardiology

## 2019-03-05 NOTE — Progress Notes (Signed)
Remote pacemaker transmission.   

## 2019-03-06 ENCOUNTER — Encounter: Payer: Self-pay | Admitting: Family Medicine

## 2019-03-12 ENCOUNTER — Other Ambulatory Visit: Payer: Self-pay

## 2019-03-12 DIAGNOSIS — I83899 Varicose veins of unspecified lower extremities with other complications: Secondary | ICD-10-CM

## 2019-03-13 ENCOUNTER — Ambulatory Visit: Payer: Medicare Other | Admitting: Physician Assistant

## 2019-03-13 ENCOUNTER — Other Ambulatory Visit: Payer: Self-pay

## 2019-03-13 ENCOUNTER — Ambulatory Visit (HOSPITAL_COMMUNITY)
Admission: RE | Admit: 2019-03-13 | Discharge: 2019-03-13 | Disposition: A | Discharge status: Home / Self Care | Payer: Medicare Other | Source: Ambulatory Visit | Attending: Family | Admitting: Family

## 2019-03-13 DIAGNOSIS — I83899 Varicose veins of unspecified lower extremities with other complications: Secondary | ICD-10-CM

## 2019-03-13 DIAGNOSIS — I8393 Asymptomatic varicose veins of bilateral lower extremities: Secondary | ICD-10-CM

## 2019-03-13 NOTE — Progress Notes (Signed)
Requested by:  [name], MD [address] [place],  [place] [address]  Reason for consultation: varicose veins    History of Present Illness   Mark Strong is a 48 y.o. (1970/09/27) male who presents with chief complaint: Varicose veins.  Patient states over the past several months he has noted an increase in prominence of varicosities especially at the end of the day after being on his feet.  These areas do not cause him any pain or discomfort however he does wonder the cause.  In 2018 he had stab phlebectomy of bilateral lower extremities with right greater saphenous vein ablation.  He has been wearing the same compression stockings over the past several years.  He also states he was at least 40 pounds heavier when he was initially measured for compression stockings and believes he likely needs new measurements and new compression stockings.  He denies any history of DVT.  He states his father had trouble with varicose veins.   Past Medical History:  Diagnosis Date  . Memory loss   . Pacemaker   . PVNS (pigmented villonodular synovitis)   . Sciatica   . Sleep apnea   . Thyroid disease   . Varicose veins of right leg with edema     Past Surgical History:  Procedure Laterality Date  . CARDIAC CATHETERIZATION     left and right   . INSERT / REPLACE / REMOVE PACEMAKER     Biotronik, dual-chamber, 05/18/2016 for SSS  . Right greater saphenous vein ablation Right     Social History   Socioeconomic History  . Marital status: Single    Spouse name: Not on file  . Number of children: 0  . Years of education: 19  . Highest education level: 12th grade  Occupational History  . Not on file  Social Needs  . Financial resource strain: Not on file  . Food insecurity    Worry: Not on file    Inability: Not on file  . Transportation needs    Medical: Not on file    Non-medical: Not on file  Tobacco Use  . Smoking status: Never Smoker  . Smokeless tobacco: Never  Used  Substance and Sexual Activity  . Alcohol use: Not Currently  . Drug use: Not Currently  . Sexual activity: Yes  Lifestyle  . Physical activity    Days per week: Not on file    Minutes per session: Not on file  . Stress: Not on file  Relationships  . Social Musician on phone: Not on file    Gets together: Not on file    Attends religious service: Not on file    Active member of club or organization: Not on file    Attends meetings of clubs or organizations: Not on file    Relationship status: Not on file  . Intimate partner violence    Fear of current or ex partner: Not on file    Emotionally abused: Not on file    Physically abused: Not on file    Forced sexual activity: Not on file  Other Topics Concern  . Not on file  Social History Narrative   Right handed    Family History  Problem Relation Age of Onset  . Arthritis Mother   . Depression Mother   . Heart disease Mother   . Arthritis Father   . COPD Father   . Diabetes Father   . Hyperlipidemia Father   .  Hypertension Father   . Kidney disease Father   . Thyroid disease Father   . Cancer Brother     Current Outpatient Medications  Medication Sig Dispense Refill  . atorvastatin (LIPITOR) 10 MG tablet Take 1 tablet (10 mg total) by mouth daily at 6 PM. 90 tablet 3  . BELBUCA 150 MCG FILM USE 1 FILM Q 12 H UTD    . BELBUCA 75 MCG FILM Take 1 Film by mouth 2 (two) times daily. 150 mcg starting Jan 31, 2019  0  . Docusate Sodium (COLACE PO) Take by mouth.    . levothyroxine (SYNTHROID) 75 MCG tablet Take 1 tablet (75 mcg total) by mouth daily before breakfast. 90 tablet 3  . linaclotide (LINZESS) 145 MCG CAPS capsule Take 145 mcg by mouth daily before breakfast.    . meloxicam (MOBIC) 15 MG tablet Take 1 tablet (15 mg total) by mouth daily. 30 tablet 1  . Multiple Vitamin (MULTIVITAMIN) capsule Take 1 capsule by mouth daily.    Marland Kitchen NASACORT ALLERGY 24HR 55 MCG/ACT AERO nasal inhaler USE 2-3 SPRAYS  IN EACH NOSTRIL QD UTD    . tadalafil (CIALIS) 10 MG tablet Take 1 tablet (10 mg total) by mouth every other day as needed for erectile dysfunction. 30 tablet 1  . tamsulosin (FLOMAX) 0.4 MG CAPS capsule Take 1 capsule (0.4 mg total) by mouth daily. 90 capsule 3  . terbinafine (LAMISIL) 250 MG tablet Take 1 tablet (250 mg total) by mouth daily. 30 tablet 0  . venlafaxine (EFFEXOR) 50 MG tablet Take 1 tablet (50 mg total) by mouth 2 (two) times daily. 180 tablet 1  . Blood Pressure Monitoring (BLOOD PRESSURE CUFF) MISC 1 Device by Does not apply route once for 1 dose. 1 each 0   No current facility-administered medications for this visit.     Allergies  Allergen Reactions  . Betadine [Povidone Iodine] Rash    REVIEW OF SYSTEMS (negative unless checked):   Cardiac:  []  Chest pain or chest pressure? []  Shortness of breath upon activity? []  Shortness of breath when lying flat? []  Irregular heart rhythm?  Vascular:  []  Pain in calf, thigh, or hip brought on by walking? []  Pain in feet at night that wakes you up from your sleep? []  Blood clot in your veins? []  Leg swelling?  Pulmonary:  []  Oxygen at home? []  Productive cough? []  Wheezing?  Neurologic:  []  Sudden weakness in arms or legs? []  Sudden numbness in arms or legs? []  Sudden onset of difficult speaking or slurred speech? []  Temporary loss of vision in one eye? []  Problems with dizziness?  Gastrointestinal:  []  Blood in stool? []  Vomited blood?  Genitourinary:  []  Burning when urinating? []  Blood in urine?  Psychiatric:  []  Major depression  Hematologic:  []  Bleeding problems? []  Problems with blood clotting?  Dermatologic:  []  Rashes or ulcers?  Constitutional:  []  Fever or chills?  Ear/Nose/Throat:  []  Change in hearing? []  Nose bleeds? []  Sore throat?  Musculoskeletal:  []  Back pain? []  Joint pain? []  Muscle pain?   Physical Examination     Vitals:   03/13/19 1342  BP: (!) 90/58   Pulse: 66  Resp: 20  Temp: 97.6 F (36.4 C)  SpO2: 100%  Weight: 166 lb 9.6 oz (75.6 kg)  Height: 6\' 2"  (1.88 m)   Body mass index is 21.39 kg/m.  General Alert, O x 3, WD, NAD  Head [place]/AT,    Neck Supple, mid-line trachea,  Pulmonary Sym exp, good B air movt  Cardiac RRR, Nl S1, S2  Vascular Vessel Right Left  Radial Palpable Palpable  Popliteal Not palpable Not palpable  PT Faintly palpable Faintly palpable  DP Palpable Palpable    Gastro- intestinal soft, non-distended, non-tender to palpation,   Musculo- skeletal M/S 5/5 throughout  , Extremities without ischemic changes  , No edema present, Varicosities present: 1-2 prominent varicosities distal shin and mid calf BLE, No Lipodermatosclerosis present  Neurologic Cranial nerves 2-12 intact , Pain and light touch intact in extremities , Motor exam as listed above  Psychiatric Judgement intact, Mood & affect appropriate for pt's clinical situation  Dermatologic See M/S exam for extremity exam, No rashes otherwise noted  Lymphatic  Palpable lymph nodes: None    Non-invasive Vascular Imaging   BLE Venous Insufficiency Duplex (03/13/19):   RLE:   neg DVT and SVT,   Ablated GSV ,   neg SSV reflux,  neg deep venous reflux  LLE:  neg DVT and SVT,   Thrombosed GSV,   neg SSV reflux,  neg deep venous reflux   Medical Decision Making   Mark Strong is a 48 y.o. male who presents with: few asymptomatic varicosities BLE  -Few asymptomatic varicosities bilateral lower extremities -We will measure patient for new 20 to 30 mmHg thigh-high compression stockings -Encouraged elevation of bilateral lower extremities when possible during the day -NSAIDs for discomfort associated with varicosities -I do not believe stab phlebectomy is warranted given only few varicosities that are asymptomatic to the patient -Follow-up as needed  Emilie RutterMatthew Eula Jaster PA-C [facility] Office:  [phone]  03/13/2019, 1:59 PM  Clinic MD: Dr. Arbie CookeyEarly

## 2019-03-16 NOTE — Telephone Encounter (Signed)
Good afternoon Dr. Carlota Raspberry,   Mark Strong is requesting a CBC.  He has no specific complaints but wants to check it out of precaution due to family history.   Thank you,  Mark Strong

## 2019-03-17 NOTE — Telephone Encounter (Signed)
I do not mind ordering a CBC to screen for anemia.  Lab only order placed.  If there are any abnormalities would recommend follow-up to discuss further.  Would also recommend he gather information from family members as to any specific recommended screening tests that may be recommended by their providers for other family members.  Thanks.

## 2019-03-31 ENCOUNTER — Ambulatory Visit: Payer: Medicare Other | Admitting: Podiatry

## 2019-04-01 ENCOUNTER — Encounter: Payer: Self-pay | Admitting: Family Medicine

## 2019-04-01 NOTE — Telephone Encounter (Signed)
Called and spoke to patient. Patient stated he is having issues with his CPAP machine. Patient stated the noise keeps him away and it isn't comfortable. Patient would like to talk about options to help with sleep apnea. Patient also stated he has been taking Melatonin to help with sleep and he doesn't feel like that is helping.  Patient hasn't been seen in almost a year. Scheduled patient for office visit with Dr. Ander Slade to follow up on these issues. Nothing further needed at this time.

## 2019-04-06 ENCOUNTER — Telehealth: Payer: Self-pay

## 2019-04-06 ENCOUNTER — Other Ambulatory Visit: Payer: Self-pay

## 2019-04-06 ENCOUNTER — Encounter: Payer: Self-pay | Admitting: Pulmonary Disease

## 2019-04-06 ENCOUNTER — Telehealth: Payer: Self-pay | Admitting: Family Medicine

## 2019-04-06 ENCOUNTER — Ambulatory Visit: Payer: Medicare Other | Admitting: Pulmonary Disease

## 2019-04-06 VITALS — BP 128/68 | HR 68 | Temp 97.4°F | Ht 74.0 in | Wt 173.2 lb

## 2019-04-06 DIAGNOSIS — G4709 Other insomnia: Secondary | ICD-10-CM

## 2019-04-06 DIAGNOSIS — Z9989 Dependence on other enabling machines and devices: Secondary | ICD-10-CM | POA: Diagnosis not present

## 2019-04-06 DIAGNOSIS — G4733 Obstructive sleep apnea (adult) (pediatric): Secondary | ICD-10-CM

## 2019-04-06 MED ORDER — ZOLPIDEM TARTRATE ER 12.5 MG PO TBCR: 12.5000 mg | EXTENDED_RELEASE_TABLET | Freq: Every evening | ORAL | 1 refills | Status: DC | PRN

## 2019-04-06 NOTE — Telephone Encounter (Signed)
Pt was returning [place] call . Please call pt back he needs to explain what it is that he needs

## 2019-04-06 NOTE — Patient Instructions (Signed)
For insomnia -Trial with Ambien 12.5  We will follow up with your CT scan of the chest  I will see you back in the office in about 3 months   Call with significant concerns   If Ambien is not working, we may try other sleep aids  Ensure you get about 6 to 8 hours of sleep

## 2019-04-06 NOTE — Progress Notes (Signed)
Mark Strong    [id number]    1970/12/19  Primary Care Physician:Greene, Ranell Patrick, MD  Referring Physician: Wendie Agreste, MD [address] [place],  [place] [address]  Chief complaint:   Patient being seen for obstructive sleep apnea Multiple lung nodules Insomnia  HPI:  Has been having significant problems with insomnia Lays there with CPAP on not able to sleep Denies any other ongoing symptoms  Continues to use CPAP on a regular basis  History Patient with obstructive sleep apnea diagnosed in April 2018-has been on auto titrating CPAP-improving symptoms Memory is better, fatigue is better, daytime sleepiness is better Multiple lung nodules diagnosed about 3 years ago CT scan report from July 2019-no new nodules, previously identified nodules are stable Patient has no significant symptoms of present Did have a couple of occasions when he felt some shortness of breath that self dissipated  Relocated from [place]  Past history of asthma  Dad did smoke, has a history of COPD No family history of lung cancer He worked in Scientist, research (medical) Never smoker  Patient usually goes to bed about 11 PM to 2 AM, takes about 20 minutes to fall asleep Wakes up about 2-3 times during the night, gets out of bed between 8 and 10 AM  Outpatient Encounter Medications as of 04/06/2019  Medication Sig  . atorvastatin (LIPITOR) 10 MG tablet Take 1 tablet (10 mg total) by mouth daily at 6 PM.  . BELBUCA 150 MCG FILM USE 1 FILM Q 12 H UTD  . BELBUCA 75 MCG FILM Take 1 Film by mouth 2 (two) times daily. 150 mcg starting Jan 31, 2019  . Docusate Sodium (COLACE PO) Take by mouth.  . levothyroxine (SYNTHROID) 75 MCG tablet Take 1 tablet (75 mcg total) by mouth daily before breakfast.  . linaclotide (LINZESS) 145 MCG CAPS capsule Take 145 mcg by mouth daily before breakfast.  . MELATONIN PO Take 1 capsule by mouth at bedtime. 6 mg  . meloxicam (MOBIC) 15 MG tablet Take 1 tablet (15  mg total) by mouth daily.  . Multiple Vitamin (MULTIVITAMIN) capsule Take 1 capsule by mouth daily.  Marland Kitchen NASACORT ALLERGY 24HR 55 MCG/ACT AERO nasal inhaler USE 2-3 SPRAYS IN EACH NOSTRIL QD UTD  . tadalafil (CIALIS) 10 MG tablet Take 1 tablet (10 mg total) by mouth every other day as needed for erectile dysfunction.  . tamsulosin (FLOMAX) 0.4 MG CAPS capsule Take 1 capsule (0.4 mg total) by mouth daily.  Marland Kitchen terbinafine (LAMISIL) 250 MG tablet Take 1 tablet (250 mg total) by mouth daily.  Marland Kitchen venlafaxine (EFFEXOR) 50 MG tablet Take 1 tablet (50 mg total) by mouth 2 (two) times daily.  . Blood Pressure Monitoring (BLOOD PRESSURE CUFF) MISC 1 Device by Does not apply route once for 1 dose.   No facility-administered encounter medications on file as of 04/06/2019.     Allergies as of 04/06/2019 - Review Complete 04/06/2019  Allergen Reaction Noted  . Betadine [povidone iodine] Rash 04/03/2018    Past Medical History:  Diagnosis Date  . Memory loss   . Pacemaker   . PVNS (pigmented villonodular synovitis)   . Sciatica   . Sleep apnea   . Thyroid disease   . Varicose veins of right leg with edema     Past Surgical History:  Procedure Laterality Date  . CARDIAC CATHETERIZATION     left and right   . INSERT / REPLACE / REMOVE PACEMAKER  Biotronik, dual-chamber, 05/18/2016 for SSS  . Right greater saphenous vein ablation Right     Family History  Problem Relation Age of Onset  . Arthritis Mother   . Depression Mother   . Heart disease Mother   . Arthritis Father   . COPD Father   . Diabetes Father   . Hyperlipidemia Father   . Hypertension Father   . Kidney disease Father   . Thyroid disease Father   . Cancer Brother     Social History   Socioeconomic History  . Marital status: Single    Spouse name: Not on file  . Number of children: 0  . Years of education: 33  . Highest education level: 12th grade  Occupational History  . Not on file  Social Needs  . Financial  resource strain: Not on file  . Food insecurity    Worry: Not on file    Inability: Not on file  . Transportation needs    Medical: Not on file    Non-medical: Not on file  Tobacco Use  . Smoking status: Never Smoker  . Smokeless tobacco: Never Used  Substance and Sexual Activity  . Alcohol use: Not Currently  . Drug use: Not Currently  . Sexual activity: Yes  Lifestyle  . Physical activity    Days per week: Not on file    Minutes per session: Not on file  . Stress: Not on file  Relationships  . Social Musician on phone: Not on file    Gets together: Not on file    Attends religious service: Not on file    Active member of club or organization: Not on file    Attends meetings of clubs or organizations: Not on file    Relationship status: Not on file  . Intimate partner violence    Fear of current or ex partner: Not on file    Emotionally abused: Not on file    Physically abused: Not on file    Forced sexual activity: Not on file  Other Topics Concern  . Not on file  Social History Narrative   Right handed    Review of Systems  Constitutional: Negative.   Respiratory: Positive for apnea.   Cardiovascular: Negative.   Gastrointestinal: Negative.   Musculoskeletal: Negative.   Neurological: Negative.   Psychiatric/Behavioral: Positive for sleep disturbance.    Vitals:   04/06/19 0950  BP: 128/68  Pulse: 68  Temp: (!) 97.4 F (36.3 C)  SpO2: 100%     Physical Exam  Constitutional: He appears well-developed and well-nourished.  HENT:  Head: Normocephalic and atraumatic.  Eyes: Pupils are equal, round, and reactive to light. Conjunctivae and EOM are normal. Right eye exhibits no discharge. Left eye exhibits no discharge.  Neck: Normal range of motion. Neck supple. No tracheal deviation present. No thyromegaly present.  Cardiovascular: Normal rate and regular rhythm.  Pulmonary/Chest: Breath sounds normal. No respiratory distress. He has no  wheezes.  Abdominal: Soft. Bowel sounds are normal. He exhibits no distension. There is no abdominal tenderness.   Data Reviewed: Multiple CT report reviewed-provided by patient Compliance data not available at present  Assessment:   History of moderate obstructive sleep apnea .  Having some difficulty with CPAP tolerance .  Mostly related to insomnia .  Melatonin used to work, having difficulty recently  Multiple nodules .  Stable on recent CT in July 2019 .  Repeat CT scan of the chest is pending  Plan/Recommendations:  .  Repeat CT scan  .  Continue CPAP on a regular basis .  Trial with Ambien 12.5  .  I will see him back in about 3 months .  Encouraged to call with any significant concerns .  We will attempt to obtain his compliance data  Virl DiamondAdewale Omarr Hann MD [facility] 04/06/2019, 10:06 AM  CC: Shade FloodGreene, Jeffrey R, MD

## 2019-04-08 ENCOUNTER — Telehealth: Payer: Self-pay | Admitting: Pulmonary Disease

## 2019-04-08 ENCOUNTER — Telehealth: Payer: Self-pay

## 2019-04-08 NOTE — Telephone Encounter (Signed)
Sleep study 08/10/2016 reviewed, moderate OSA with AHI 16.8, without significant hypoxemia with lowest SaO2 of 93%.  Dental device reasonable, paperwork completed and placed in the fax box

## 2019-04-08 NOTE — Telephone Encounter (Signed)
Spoke with pt and provider and paper work is in process.

## 2019-04-08 NOTE — Telephone Encounter (Signed)
Spoke with pt and he informed me that he has decided to try a new device to help with his sleep apnea. Forms has been received and in the process of being completed and faxed.Will call pt when forms are completed and faxed. Provider has been informed.

## 2019-04-08 NOTE — Telephone Encounter (Signed)
Lional Schlachter (Key: [id number])  Your information has been submitted to [employer]. [employer] will review the request and notify you of the determination decision directly, typically within 3 business days of your submission and once all necessary information is received.  You will also receive your request decision electronically. To check for an update later, open the request again from your dashboard.  Call made to patient, he states his formulary for 2021 covers the [place]. He states this is frustrating that in 2 months this medication will be covered. I made him aware I can relate to his frustration however we have submitted the PA and we would expect a decision in 3 days or less. Voiced understanding.   Will route to OA nurse to f/u.

## 2019-04-09 ENCOUNTER — Other Ambulatory Visit: Payer: Self-pay | Admitting: Podiatry

## 2019-04-09 ENCOUNTER — Telehealth: Payer: Self-pay | Admitting: Podiatry

## 2019-04-09 NOTE — Telephone Encounter (Signed)
Pt called requesting a refill on Lamisil. Pt is scheduled for next lamisil follow up on 11/19.

## 2019-04-09 NOTE — Telephone Encounter (Signed)
Left message informing pt I had reviewed his medications and he had multilple refills of the terbinafine, and we would need to see him prior to refilling again, the medication did stay in his system.

## 2019-04-10 NOTE — Telephone Encounter (Signed)
Mark Strong Key: [id number]  Need help? Call us at [phone]  Outcome  Denied on October 21  Drug Ambien CR 12.5MG  er tablets  Form[place] Cross [place] Medicare Part [place] General Authorization Form  Ambien PA denied. [employer] on [place] [phone], spoke with Chantel. Patient was denied Ambien, because 2 alternatives have not been tried.  [employer] Ambien alternatives are zolpidem or temazepam.  Message routed to Dr. Ander Slade

## 2019-04-13 ENCOUNTER — Telehealth: Payer: Self-pay | Admitting: Pulmonary Disease

## 2019-04-13 MED ORDER — TEMAZEPAM 15 MG PO CAPS: 15.0000 mg | ORAL_CAPSULE | Freq: Every evening | ORAL | 2 refills | Status: DC | PRN

## 2019-04-13 NOTE — Telephone Encounter (Signed)
Called and spoke to pharmacy.  Most sleep medications can make the patient more drowsy in combination with Belbuca. Pharmacy recommended a medication that is not controlled release if patient is in need of sleep medication.   Routing to Dr. Jenetta Downer.

## 2019-04-13 NOTE — Telephone Encounter (Signed)
Called spoke with patient who reported he has already received a letter in the mail regarding this from his insurance.  Per patient his letter states that Zolpidem (not controlled release), Zaleplon and Temazepam are listed as covered alternatives.  Patient requests to try the Zolpidem (not controlled release) or the Zaleplon instead of the Temazepam.  Sorry Dr Ander Slade; please advise, thank you.

## 2019-04-13 NOTE — Telephone Encounter (Signed)
Will order temazepam

## 2019-04-13 NOTE — Telephone Encounter (Signed)
Patient states sleeping pill called into pharmacy has a reaction with his pain medication.  Pharmacy is [facility].  Patient phone number is [phone].

## 2019-04-17 ENCOUNTER — Other Ambulatory Visit: Payer: Self-pay

## 2019-04-17 ENCOUNTER — Ambulatory Visit (INDEPENDENT_AMBULATORY_CARE_PROVIDER_SITE_OTHER)
Admission: RE | Admit: 2019-04-17 | Discharge: 2019-04-17 | Disposition: A | Discharge status: Home / Self Care | Payer: Medicare Other | Source: Ambulatory Visit | Attending: Pulmonary Disease | Admitting: Pulmonary Disease

## 2019-04-17 DIAGNOSIS — R911 Solitary pulmonary nodule: Secondary | ICD-10-CM | POA: Diagnosis not present

## 2019-04-17 MED ORDER — IOHEXOL 300 MG/ML  SOLN
80.0000 mL | Freq: Once | INTRAMUSCULAR | Status: AC | PRN
  Administered 2019-04-17: 80 mL via INTRAVENOUS

## 2019-04-17 MED ORDER — ZALEPLON 5 MG PO CAPS: 5.0000 mg | ORAL_CAPSULE | Freq: Every evening | ORAL | 0 refills | Status: DC | PRN

## 2019-04-17 NOTE — Telephone Encounter (Signed)
Call made to patient, confirmed DOB, made aware of AO recommendations. Patient states as long as AO thinks it will be safe he is fine trying the Sonata.   Call made to pharmacy, made aware AO has given the okay for the patient to take the sonata. Voiced understanding. They will the prescription ready for the patient.   Nothing further needed at this time.

## 2019-04-17 NOTE — Telephone Encounter (Signed)
LMTCB to update patient.  

## 2019-04-17 NOTE — Telephone Encounter (Signed)
I sent in zaleplon, low dose  Yes it may cause some drowsiness- it should not if he is getting 6-8 hours of rest time Sent in the low dose prescription  He is on the Belbuca long term and still cant sleep  May try melatonin over the counter 3-5 mg

## 2019-04-20 ENCOUNTER — Telehealth: Payer: Self-pay | Admitting: Pulmonary Disease

## 2019-04-20 ENCOUNTER — Ambulatory Visit
Admission: RE | Admit: 2019-04-20 | Discharge: 2019-04-20 | Disposition: A | Discharge status: Home / Self Care | Payer: Self-pay | Source: Ambulatory Visit | Attending: Pulmonary Disease | Admitting: Pulmonary Disease

## 2019-04-20 ENCOUNTER — Other Ambulatory Visit: Payer: Self-pay | Admitting: Pulmonary Disease

## 2019-04-20 DIAGNOSIS — G4733 Obstructive sleep apnea (adult) (pediatric): Secondary | ICD-10-CM

## 2019-04-20 NOTE — Telephone Encounter (Signed)
Order placed. Skype sent to [facility] to make aware. Nothing further needed at this time.

## 2019-04-27 ENCOUNTER — Ambulatory Visit (INDEPENDENT_AMBULATORY_CARE_PROVIDER_SITE_OTHER): Payer: Medicare Other | Admitting: Family Medicine

## 2019-04-27 ENCOUNTER — Other Ambulatory Visit: Payer: Self-pay

## 2019-04-27 ENCOUNTER — Encounter: Payer: Self-pay | Admitting: Family Medicine

## 2019-04-27 ENCOUNTER — Other Ambulatory Visit: Payer: Self-pay | Admitting: Family Medicine

## 2019-04-27 VITALS — BP 103/49 | HR 60 | Temp 98.3°F | Wt 183.2 lb

## 2019-04-27 DIAGNOSIS — G894 Chronic pain syndrome: Secondary | ICD-10-CM

## 2019-04-27 DIAGNOSIS — S61432A Puncture wound without foreign body of left hand, initial encounter: Secondary | ICD-10-CM | POA: Diagnosis not present

## 2019-04-27 MED ORDER — MELOXICAM 15 MG PO TABS: 15.0000 mg | ORAL_TABLET | Freq: Every day | ORAL | 1 refills | Status: DC

## 2019-04-27 NOTE — Patient Instructions (Addendum)
  Hand looks good.  No change in mobic for now, but would avoid using for prolonged periods.   Return to the clinic or go to the nearest emergency room if any of your symptoms worsen or new symptoms occur.    If you have lab work done today you will be contacted with your lab results within the next 2 weeks.  If you have not heard from [place] then please contact us. The fastest way to get your results is to register for My Chart.   IF you received an x-ray today, you will receive an invoice from [facility]. Please contact [facility] at [phone] with questions or concerns regarding your invoice.   IF you received labwork today, you will receive an invoice from [place]. Please contact [facility] at [phone] with questions or concerns regarding your invoice.   Our billing staff will not be able to assist you with questions regarding bills from these companies.  You will be contacted with the lab results as soon as they are available. The fastest way to get your results is to activate your My Chart account. Instructions are located on the last page of this paperwork. If you have not heard from [place] regarding the results in 2 weeks, please contact this office.

## 2019-04-27 NOTE — Progress Notes (Signed)
Subjective:  Patient ID: Mark Strong, male    DOB: 23-Oct-1970  Age: 48 y.o. MRN: [id number]  CC:  Chief Complaint  Patient presents with  . Laceration    cut self with contruction knife DOI was 04/20/19. Up to date on Tdap  . Medication Refill    need refill on  meloxicam    HPI Mark Strong presents for   Knife wound: DOI: approx 11/2. Cutting caulk - accidentally cut base of R wrist/thumb area. Cleansed with soap and water initially.  More red few days later. Small amount of pus around day 4 - started bactine - improved since.  No fever, no restriction in mvmt.    Mobic refill: History of chronic pain syndrome - followed by pain mgt.  Last Rx may 2019, 30 with 1 RF.  Taking during winter months - intermittently for arthritis - up to a week or two at a time.     History Patient Active Problem List   Diagnosis Date Noted  . 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 Near syncope 12/18/2018  . Atypical atrial flutter (HCC) 12/18/2018  . Hyperprolactinemia (HCC) 10/28/2018  . Hypercholesterolemia 10/15/2018  . SSS (sick sinus syndrome) (HCC) 08/20/2018  . Pacemaker 05/28/2018  . OSA (obstructive sleep apnea) 05/28/2018  . 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 Hypothyroidism 04/28/2018  . Right thyroid nodule 04/28/2018  . Pain of left hip joint 04/17/2018  . 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 Chronic back pain 03/14/2018  . Bradycardia 03/14/2018   Past Medical History:  Diagnosis Date  . Memory loss   . Pacemaker   . PVNS (pigmented villonodular synovitis)   . Sciatica   . Sleep apnea   . Thyroid disease   . Varicose veins of right leg with edema    Past Surgical History:  Procedure Laterality Date  . CARDIAC CATHETERIZATION     left and right   . INSERT / REPLACE / REMOVE PACEMAKER     Biotronik, dual-chamber, 05/18/2016 for SSS  . Right greater saphenous vein  ablation Right    Allergies  Allergen Reactions  . Betadine [Povidone Iodine] Rash   Prior to Admission medications   Medication Sig Start Date End Date Taking? Authorizing Provider  atorvastatin (LIPITOR) 10 MG tablet Take 1 tablet (10 mg total) by mouth daily at 6 PM. 09/04/18  Yes Corum, Minerva Fester, MD  BELBUCA 150 MCG FILM USE 1 FILM Q 12 H UTD 01/20/19  Yes [provider]  BELBUCA 75 MCG FILM Take 1 Film by mouth 2 (two) times daily. 150 mcg starting Jan 31, 2019 05/23/18  Yes [provider]  Docusate Sodium (COLACE PO) Take by mouth.   Yes [provider]  levothyroxine (SYNTHROID) 75 MCG tablet Take 1 tablet (75 mcg total) by mouth daily before breakfast. 04/28/18  Yes Romero Belling, MD  linaclotide [facility]) 145 MCG CAPS capsule Take 145 mcg by mouth daily before breakfast.   Yes [provider]  MELATONIN PO Take 1 capsule by mouth at bedtime. 6 mg   Yes [provider]  meloxicam (MOBIC) 15 MG tablet Take 1 tablet (15 mg total) by mouth daily. 04/14/18  Yes [name], MD  Multiple Vitamin (MULTIVITAMIN) capsule Take 1 capsule by mouth daily.   Yes [provider]  NASACORT ALLERGY 24HR 55 MCG/ACT AERO nasal inhaler USE 2-3 SPRAYS IN EACH NOSTRIL QD UTD 01/29/19  Yes [provider]  tadalafil (CIALIS) 10 MG tablet Take 1 tablet (10 mg total) by mouth every other day as needed for erectile dysfunction. 04/14/18  Yes Wendie Agreste, MD  tamsulosin (FLOMAX) 0.4 MG CAPS capsule Take 1 capsule (0.4 mg total) by mouth daily. 09/04/18  Yes Corum, Rex Kras, MD  terbinafine (LAMISIL) 250 MG tablet Take 1 tablet (250 mg total) by mouth daily. 02/12/19  Yes Hyatt, Max T, DPM  venlafaxine (EFFEXOR) 50 MG tablet Take 1 tablet (50 mg total) by mouth 2 (two) times daily. 03/02/19  Yes Wendie Agreste, MD  zaleplon (SONATA) 5 MG capsule Take 1 capsule (5 mg total) by mouth at bedtime as needed for sleep. 04/17/19  Yes Olalere, Adewale A,  MD  Blood Pressure Monitoring (BLOOD PRESSURE CUFF) MISC 1 Device by Does not apply route once for 1 dose. 10/15/18 10/15/18  [name], Dani Gobble, MD   Social History   Socioeconomic History  . Marital status: Single    Spouse name: Not on file  . Number of children: 0  . Years of education: 36  . Highest education level: 12th grade  Occupational History  . Not on file  Social Needs  . Financial resource strain: Not on file  . Food insecurity    Worry: Not on file    Inability: Not on file  . Transportation needs    Medical: Not on file    Non-medical: Not on file  Tobacco Use  . Smoking status: Never Smoker  . Smokeless tobacco: Never Used  Substance and Sexual Activity  . Alcohol use: Not Currently  . Drug use: Not Currently  . Sexual activity: Yes  Lifestyle  . Physical activity    Days per week: Not on file    Minutes per session: Not on file  . Stress: Not on file  Relationships  . Social Herbalist on phone: Not on file    Gets together: Not on file    Attends religious service: Not on file    Active member of club or organization: Not on file    Attends meetings of clubs or organizations: Not on file    Relationship status: Not on file  . Intimate partner violence    Fear of current or ex partner: Not on file    Emotionally abused: Not on file    Physically abused: Not on file    Forced sexual activity: Not on file  Other Topics Concern  . Not on file  Social History Narrative   Right handed    Review of Systems   Objective:   Vitals:   04/27/19 1506  BP: (!) 103/49  Pulse: 60  Temp: 98.3 F (36.8 C)  TempSrc: Oral  SpO2: 98%  Weight: 183 lb 3.2 oz (83.1 kg)     Physical Exam Vitals signs reviewed.  Constitutional:      General: He is not in acute distress.    Appearance: He is well-developed.  HENT:     Head: Normocephalic and atraumatic.  Cardiovascular:     Rate and Rhythm: Normal rate.  Pulmonary:     Effort: Pulmonary  effort is normal.  Musculoskeletal:     Left wrist: He exhibits normal range of motion (nvi distally, full strength in fingers. ), no tenderness, no bony tenderness and no swelling.       Arms:  Skin:    General: Skin is warm and dry.  Neurological:     Mental Status:  He is alert and oriented to person, place, and time.      Assessment & Plan:  Mark FogoChristopher Strong is a 48 y.o. male . Puncture wound of left hand without foreign body, initial encounter  - healing well. rtc precautions.   Chronic pain syndrome - Plan: meloxicam (MOBIC) 15 MG tablet  -Episodic use discussed, refilled temporarily.  No orders of the defined types were placed in this encounter.  Patient Instructions    Hand looks good.  No change in mobic for now, but would avoid using for prolonged periods.   Return to the clinic or go to the nearest emergency room if any of your symptoms worsen or new symptoms occur.    If you have lab work done today you will be contacted with your lab results within the next 2 weeks.  If you have not heard from us then please contact us. The fastest way to get your results is to register for My Chart.   IF you received an x-ray today, you will receive an invoice from [facility]. Please contact [facility] at [phone] with questions or concerns regarding your invoice.   IF you received labwork today, you will receive an invoice from [facility]. Please contact [facility] at [phone] with questions or concerns regarding your invoice.   Our billing staff will not be able to assist you with questions regarding bills from these companies.  You will be contacted with the lab results as soon as they are available. The fastest way to get your results is to activate your My Chart account. Instructions are located on the last page of this paperwork. If you have not heard from us regarding the results in 2 weeks, please contact this office.          Signed,  Meredith StaggersJeffrey Avrom Robarts, MD [facility]

## 2019-04-28 ENCOUNTER — Telehealth: Payer: Self-pay | Admitting: Pulmonary Disease

## 2019-04-28 ENCOUNTER — Encounter: Payer: Self-pay | Admitting: Family Medicine

## 2019-04-28 NOTE — Telephone Encounter (Signed)
Dr. Ander Slade please review Ct results.

## 2019-04-29 NOTE — Telephone Encounter (Signed)
Inform patient  Lung nodule is stable from previous-compared to July 2019

## 2019-04-29 NOTE — Telephone Encounter (Signed)
Spoke with pt and notified of results per Dr. Olalere. Pt verbalized understanding and denied any questions.  

## 2019-05-04 ENCOUNTER — Telehealth: Payer: Self-pay | Admitting: Family Medicine

## 2019-05-04 NOTE — Telephone Encounter (Signed)
LVM to r/s appt for 09/01/2018 with dr. Carlota Raspberry. Provider will be out of the office on that day.

## 2019-05-07 ENCOUNTER — Encounter: Payer: Self-pay | Admitting: Podiatry

## 2019-05-07 ENCOUNTER — Ambulatory Visit: Payer: Medicare Other | Admitting: Podiatry

## 2019-05-07 ENCOUNTER — Other Ambulatory Visit: Payer: Self-pay

## 2019-05-07 DIAGNOSIS — L603 Nail dystrophy: Secondary | ICD-10-CM

## 2019-05-07 MED ORDER — TERBINAFINE HCL 250 MG PO TABS: 250.0000 mg | ORAL_TABLET | Freq: Every day | ORAL | 0 refills | Status: DC

## 2019-05-09 NOTE — Progress Notes (Signed)
He presents today for follow-up of his nail fungus he is completed his third round of every other day dosing he states that this toenail is just so slow to grow but looking much better it looks.  He states that they are doing so good.  He denies any problems taking the medication.  Objective: Vital signs are stable he is alert and oriented x3.  Pulses are palpable.  Toenails appear to be healing very nicely he is one that grows very slowly most likely associated with nail dystrophy as well.  But it is clearing behind the area of dystrophy and onychomycosis.  Assessment: Well-healing onychomycosis.  Plan: Regarding continue 1 tablet every other day for the next 2 months and I will follow-up with him in 3 months.  May need to consider another blood work at that time.

## 2019-05-20 ENCOUNTER — Ambulatory Visit (INDEPENDENT_AMBULATORY_CARE_PROVIDER_SITE_OTHER): Payer: Medicare Other | Admitting: *Deleted

## 2019-05-20 DIAGNOSIS — R001 Bradycardia, unspecified: Secondary | ICD-10-CM | POA: Diagnosis not present

## 2019-05-20 LAB — CUP PACEART REMOTE DEVICE CHECK
Date Time Interrogation Session: 20201202074259
Implantable Lead Implant Date: 20171217
Implantable Lead Implant Date: 20171217
Implantable Lead Location: 753859
Implantable Lead Location: 753860
Implantable Lead Model: 377
Implantable Lead Model: 377
Implantable Lead Serial Number: 49336100
Implantable Lead Serial Number: 49685190
Implantable Pulse Generator Implant Date: 20171217
Pulse Gen Model: 394929
Pulse Gen Serial Number: 68775417

## 2019-05-27 ENCOUNTER — Other Ambulatory Visit: Payer: Self-pay | Admitting: Endocrinology

## 2019-06-15 NOTE — Progress Notes (Signed)
PPM remote 

## 2019-07-20 ENCOUNTER — Encounter: Payer: Self-pay | Admitting: Family Medicine

## 2019-07-24 ENCOUNTER — Encounter: Payer: Self-pay | Admitting: Family Medicine

## 2019-07-24 NOTE — Telephone Encounter (Signed)
Call pt - check status and time of urology appointment. If not planned in few days, please schedule to be seen in office. Thanks.

## 2019-07-29 DIAGNOSIS — Z79899 Other long term (current) drug therapy: Secondary | ICD-10-CM | POA: Diagnosis not present

## 2019-07-29 DIAGNOSIS — G894 Chronic pain syndrome: Secondary | ICD-10-CM | POA: Diagnosis not present

## 2019-07-29 DIAGNOSIS — Z79891 Long term (current) use of opiate analgesic: Secondary | ICD-10-CM | POA: Diagnosis not present

## 2019-07-29 DIAGNOSIS — G5600 Carpal tunnel syndrome, unspecified upper limb: Secondary | ICD-10-CM | POA: Diagnosis not present

## 2019-07-29 DIAGNOSIS — M4302 Spondylolysis, cervical region: Secondary | ICD-10-CM | POA: Diagnosis not present

## 2019-07-29 DIAGNOSIS — M542 Cervicalgia: Secondary | ICD-10-CM | POA: Diagnosis not present

## 2019-07-29 DIAGNOSIS — M47817 Spondylosis without myelopathy or radiculopathy, lumbosacral region: Secondary | ICD-10-CM | POA: Diagnosis not present

## 2019-07-30 ENCOUNTER — Encounter: Payer: Self-pay | Admitting: Family Medicine

## 2019-07-30 ENCOUNTER — Other Ambulatory Visit: Payer: Self-pay

## 2019-07-30 ENCOUNTER — Ambulatory Visit (INDEPENDENT_AMBULATORY_CARE_PROVIDER_SITE_OTHER): Payer: Medicare Other | Admitting: Family Medicine

## 2019-07-30 VITALS — BP 116/73 | HR 60 | Temp 98.4°F | Ht 74.0 in | Wt 192.0 lb

## 2019-07-30 DIAGNOSIS — F411 Generalized anxiety disorder: Secondary | ICD-10-CM

## 2019-07-30 DIAGNOSIS — R350 Frequency of micturition: Secondary | ICD-10-CM | POA: Diagnosis not present

## 2019-07-30 DIAGNOSIS — N401 Enlarged prostate with lower urinary tract symptoms: Secondary | ICD-10-CM

## 2019-07-30 DIAGNOSIS — R1030 Lower abdominal pain, unspecified: Secondary | ICD-10-CM

## 2019-07-30 LAB — POCT URINALYSIS DIP (MANUAL ENTRY)
Bilirubin, UA: NEGATIVE
Blood, UA: NEGATIVE
Glucose, UA: NEGATIVE mg/dL
Ketones, POC UA: NEGATIVE mg/dL
Leukocytes, UA: NEGATIVE
Nitrite, UA: NEGATIVE
Protein Ur, POC: NEGATIVE mg/dL
Spec Grav, UA: >=1.03 — AB (ref 1.010–1.025)
Urobilinogen, UA: 0.2 E.U./dL
pH, UA: 7 (ref 5.0–8.0)

## 2019-07-30 LAB — POC MICROSCOPIC URINALYSIS (UMFC): Mucus: ABSENT

## 2019-07-30 NOTE — Patient Instructions (Addendum)
  Keep follow-up with urology next week as planned.  I did not do the prostate exam today as they will likely perform an exam at that visit and without recent changes in symptoms, can defer evaluation until that visit.  If any acute worsening prior to that time be seen right away.  I will check PSA and urinalysis today, and let you know if there are concerns.   If you have lab work done today you will be contacted with your lab results within the next 2 weeks.  If you have not heard from [place] then please contact us. The fastest way to get your results is to register for My Chart.   IF you received an x-ray today, you will receive an invoice from [facility]. Please contact [facility] at [phone] with questions or concerns regarding your invoice.   IF you received labwork today, you will receive an invoice from [place]. Please contact [facility] at [phone] with questions or concerns regarding your invoice.   Our billing staff will not be able to assist you with questions regarding bills from these companies.  You will be contacted with the lab results as soon as they are available. The fastest way to get your results is to activate your My Chart account. Instructions are located on the last page of this paperwork. If you have not heard from [place] regarding the results in 2 weeks, please contact this office.

## 2019-07-30 NOTE — Progress Notes (Signed)
Subjective:  Patient ID: Mark Strong, male    DOB: May 07, 1971  Age: 49 y.o. MRN: [id number]  CC:  Chief Complaint  Patient presents with  . prostate pain    pt states when the pt has bad gas or had a BM pt gets intence pain in his prostate area.pt states that this pain will sometimes shoot to his penis every now and then. pt had an appt with a urologist 08/05/19 @ 12:30    HPI Mark Strong presents for   Groin pain: Noticed past 5 months  - episodic. Notes if has gas or during bowel movement - pain in area b/t testicle and anus. Feels like in prostate area. Sometimes radiates to penile area, no testicle pain/rash/swelling.  No penile discharge. No new sexual partners past 2 years.  Pain occurs about 1-2 times per week. Lasts few minutes. Some weeks without pain. No change in frequency/intensity. No dysuria/hematuria.some frequency at times. Nocturia - 1-2 per night - stable.  Rare hesitancy, occasional dribbling, incomplete emptying.  No fever, no abd pain.  Diagnosed with mild enlarged prostate in [place] - treated with Flomax. No recent PSA.  No current pain - last time earlier this week.   No cycling, no known injury.   appt with urology in 6 days - [facility].   History Patient Active Problem List   Diagnosis Date Noted  . 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 Near syncope 12/18/2018  . Atypical atrial flutter (HCC) 12/18/2018  . Hyperprolactinemia (HCC) 10/28/2018  . Hypercholesterolemia 10/15/2018  . SSS (sick sinus syndrome) (HCC) 08/20/2018  . Pacemaker 05/28/2018  . OSA (obstructive sleep apnea) 05/28/2018  . 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 Hypothyroidism 04/28/2018  . Right thyroid nodule 04/28/2018  . Pain of left hip joint 04/17/2018  . 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 Chronic back pain 03/14/2018  . Bradycardia 03/14/2018   Past Medical History:    Diagnosis Date  . Memory loss   . Pacemaker   . PVNS (pigmented villonodular synovitis)   . Sciatica   . Sleep apnea   . Thyroid disease   . Varicose veins of right leg with edema    Past Surgical History:  Procedure Laterality Date  . CARDIAC CATHETERIZATION     left and right   . INSERT / REPLACE / REMOVE PACEMAKER     Biotronik, dual-chamber, 05/18/2016 for SSS  . Right greater saphenous vein ablation Right    Allergies  Allergen Reactions  . Betadine [Povidone Iodine] Rash   Prior to Admission medications   Medication Sig Start Date End Date Taking? Authorizing Provider  atorvastatin (LIPITOR) 10 MG tablet Take 1 tablet (10 mg total) by mouth daily at 6 PM. 09/04/18  Yes Corum, Minerva Fester, MD  Docusate Sodium (COLACE PO) Take by mouth.   Yes [provider]  HYDROcodone-acetaminophen (NORCO/VICODIN) 5-325 MG tablet Take 1 tablet by mouth 4 (four) times daily as needed for moderate pain.   Yes [provider]  levothyroxine (SYNTHROID) 75 MCG tablet TAKE 1 TABLET(75 MCG) BY MOUTH DAILY BEFORE BREAKFAST 05/27/19  Yes Romero Belling, MD  linaclotide [facility]) 145 MCG CAPS capsule Take 145 mcg by mouth daily before breakfast.   Yes [provider]  MELATONIN PO Take 1 capsule by mouth at bedtime. 6 mg   Yes [provider]  meloxicam (MOBIC) 15 MG tablet Take 1 tablet (15 mg total) by mouth daily.  04/27/19  Yes [name], MD  Multiple Vitamin (MULTIVITAMIN) capsule Take 1 capsule by mouth daily.   Yes [provider]  NASACORT ALLERGY 24HR 55 MCG/ACT AERO nasal inhaler USE 2-3 SPRAYS IN EACH NOSTRIL QD UTD 01/29/19  Yes [provider]  tadalafil (CIALIS) 10 MG tablet Take 1 tablet (10 mg total) by mouth every other day as needed for erectile dysfunction. 04/14/18  Yes [name], MD  tamsulosin (FLOMAX) 0.4 MG CAPS capsule Take 1 capsule (0.4 mg total) by mouth daily. 09/04/18  Yes Corum, Minerva Fester, MD  terbinafine (LAMISIL)  250 MG tablet Take 1 tablet (250 mg total) by mouth daily. 05/07/19  Yes Hyatt, Max T, DPM  venlafaxine (EFFEXOR) 50 MG tablet Take 1 tablet (50 mg total) by mouth 2 (two) times daily. 03/02/19  Yes [name], MD  zaleplon (SONATA) 5 MG capsule Take 1 capsule (5 mg total) by mouth at bedtime as needed for sleep. 04/17/19  Yes Olalere, Adewale A, MD  BELBUCA 150 MCG FILM USE 1 FILM Q 12 H UTD 01/20/19   [provider]  BELBUCA 75 MCG FILM Take 1 Film by mouth 2 (two) times daily. 150 mcg starting Jan 31, 2019 05/23/18   [provider]  Blood Pressure Monitoring (BLOOD PRESSURE CUFF) MISC 1 Device by Does not apply route once for 1 dose. 10/15/18 10/15/18  [name], [name], MD   Social History   Socioeconomic History  . Marital status: Single    Spouse name: Not on file  . Number of children: 0  . Years of education: 59  . Highest education level: 12th grade  Occupational History  . Not on file  Tobacco Use  . Smoking status: Never Smoker  . Smokeless tobacco: Never Used  Substance and Sexual Activity  . Alcohol use: Not Currently  . Drug use: Not Currently  . Sexual activity: Yes  Other Topics Concern  . Not on file  Social History Narrative   Right handed   Social Determinants of Health   Financial Resource Strain:   . Difficulty of Paying Living Expenses: Not on file  Food Insecurity:   . Worried About Programme researcher, broadcasting/film/video in the Last Year: Not on file  . Ran Out of Food in the Last Year: Not on file  Transportation Needs:   . Lack of Transportation (Medical): Not on file  . Lack of Transportation (Non-Medical): Not on file  Physical Activity:   . Days of Exercise per Week: Not on file  . Minutes of Exercise per Session: Not on file  Stress:   . Feeling of Stress : Not on file  Social Connections:   . Frequency of Communication with Friends and Family: Not on file  . Frequency of Social Gatherings with Friends and Family: Not on file  . Attends  Religious Services: Not on file  . Active Member of Clubs or Organizations: Not on file  . Attends Banker Meetings: Not on file  . Marital Status: Not on file  Intimate Partner Violence:   . Fear of Current or Ex-Partner: Not on file  . Emotionally Abused: Not on file  . Physically Abused: Not on file  . Sexually Abused: Not on file    Review of Systems  Per HPI.  Objective:   Vitals:   07/30/19 1147  BP: 116/73  Pulse: 60  Temp: 98.4 F (36.9 C)  TempSrc: Temporal  SpO2: 98%  Weight: 192 lb (87.1  kg)  Height: 6\' 2"  (1.88 m)     Physical Exam Vitals reviewed.  Constitutional:      General: He is not in acute distress.    Appearance: He is well-developed.  HENT:     Head: Normocephalic and atraumatic.  Cardiovascular:     Rate and Rhythm: Normal rate.  Pulmonary:     Effort: Pulmonary effort is normal.  Genitourinary:    Penis: Normal. No erythema, discharge, swelling or lesions.      Testes: Normal.        Right: Tenderness or swelling not present.        Left: Tenderness or swelling not present.     Epididymis:     Right: Normal. No tenderness.     Left: Normal. No tenderness.     Comments: Perineum nontender.  Neurological:     Mental Status: He is alert and oriented to person, place, and time.        Assessment & Plan:  Mark Strong is a 49 y.o. male . Benign prostatic hyperplasia with urinary frequency - Plan: PSA  Inguinal pain, unspecified laterality  Urinary frequency - Plan: PSA, POCT urinalysis dipstick, POCT Microscopic Urinalysis ([facility])  Suspect component of BPH with lower urinary tract symptoms, has been treated with Flomax previously.  Episodic groin pain, potentially could be related to prostate, pudendal nerve irritation? but will have evaluated further with urology. Overall his symptoms have been stable for years, infrequent, short lasting discomfort.  Check PSA, urinalysis, deferred DRE with urology follow-up in 6  days.  RTC precautions given.   No orders of the defined types were placed in this encounter.  Patient Instructions       If you have lab work done today you will be contacted with your lab results within the next 2 weeks.  If you have not heard from [place] then please contact us. The fastest way to get your results is to register for My Chart.   IF you received an x-ray today, you will receive an invoice from [facility]. Please contact [facility] at [phone] with questions or concerns regarding your invoice.   IF you received labwork today, you will receive an invoice from [place]. Please contact [facility] at [phone] with questions or concerns regarding your invoice.   Our billing staff will not be able to assist you with questions regarding bills from these companies.  You will be contacted with the lab results as soon as they are available. The fastest way to get your results is to activate your My Chart account. Instructions are located on the last page of this paperwork. If you have not heard from [place] regarding the results in 2 weeks, please contact this office.         Signed, Merri Ray, MD [facility]

## 2019-07-31 LAB — PSA: Prostate Specific Ag, Serum: 2.4 ng/mL (ref 0.0–4.0)

## 2019-08-03 NOTE — Telephone Encounter (Signed)
I have pended medication for pt to be refill. Pt wanted to get refill of medication and forgot to say it at his last office visit on 07/30/19.   Meds pended for  1 year.   Please advise on refills

## 2019-08-04 ENCOUNTER — Telehealth: Payer: Self-pay

## 2019-08-04 MED ORDER — TAMSULOSIN HCL 0.4 MG PO CAPS: 0.4000 mg | ORAL_CAPSULE | Freq: Every day | ORAL | 3 refills | Status: DC

## 2019-08-04 MED ORDER — VENLAFAXINE HCL 50 MG PO TABS: 50.0000 mg | ORAL_TABLET | Freq: Two times a day (BID) | ORAL | 1 refills | Status: DC

## 2019-08-04 MED ORDER — ATORVASTATIN CALCIUM 10 MG PO TABS: 10.0000 mg | ORAL_TABLET | Freq: Every day | ORAL | 3 refills | Status: DC

## 2019-08-04 NOTE — Telephone Encounter (Signed)
More information needed.  Which medications specifically were higher than in the past?  And did pharmacy provide a less expensive alternative?  Thanks.

## 2019-08-04 NOTE — Telephone Encounter (Signed)
My apologies, the medications were lipitor, flomax, and effexor. To the best of my knowledge the pharmacy ran a discount card like Good Rx but did not offer alternatives and instructed the pt. To call the office. The pharmacy also did not offer options for changing dispense quantity to adjust price.

## 2019-08-04 NOTE — Telephone Encounter (Signed)
LVM for pt to cb with his concerns with medication costing more that $100 and wanting a alternative.

## 2019-08-04 NOTE — Telephone Encounter (Signed)
Patient believes medication is a little pricey , and would like something cheaper please.  Please advise

## 2019-08-04 NOTE — Telephone Encounter (Signed)
The pt. Just called back, it seems like the pharmacy made a mistake and that only the Effexor was to expensive

## 2019-08-04 NOTE — Telephone Encounter (Signed)
My apologies, the medications were lipitor, flomax, and effexor. To the best of my knowledge the pharmacy ran a discount card like [facility] Rx but did not offer alternatives and instructed the pt. To call the office. The pharmacy also did not offer options for changing dispense quantity to adjust price.   Please Advise.

## 2019-08-04 NOTE — Telephone Encounter (Signed)
Pt. Called into office to report that the 3 medications written for him were around 100$ a piece after insurance. Pt. Asserted that this was relatively high for him and requested a cheaper alternative if possible

## 2019-08-05 ENCOUNTER — Encounter: Payer: Self-pay | Admitting: Podiatry

## 2019-08-05 ENCOUNTER — Encounter: Payer: Self-pay | Admitting: Family Medicine

## 2019-08-05 ENCOUNTER — Telehealth: Payer: Self-pay | Admitting: Podiatry

## 2019-08-05 MED ORDER — TERBINAFINE HCL 250 MG PO TABS: 250.0000 mg | ORAL_TABLET | Freq: Every day | ORAL | 0 refills | Status: DC

## 2019-08-05 NOTE — Telephone Encounter (Signed)
He is currently on Effexor 50 mg twice per day.  Can check to see if the Effexor XR 100 mg once per day would be any less expensive, but likely that may be more.  Other options would be desvenlafaxine (Pristiq), or Cymbalta.  Not sure if those would be any less expensive.  Have him check with pharmacist.  Make sure he does not run out of the Effexor in the meantime. Let me know.

## 2019-08-05 NOTE — Addendum Note (Signed)
Addended by: Lottie Rater E on: 08/05/2019 11:43 AM   Modules accepted: Orders

## 2019-08-05 NOTE — Telephone Encounter (Signed)
Informed Patient over the phone , and stated he would check with the pharmacy about the alternative medications to see what is less expensive.

## 2019-08-05 NOTE — Telephone Encounter (Signed)
Pt stated since we had to r/s appt due to inclement weather on 08/06/19 and his aapt will now be 09/08/19 w/[facility] can he get his refill on lamisil

## 2019-08-05 NOTE — Telephone Encounter (Signed)
Hey can you advise.  Thanks Misty Stanley

## 2019-08-06 ENCOUNTER — Ambulatory Visit: Payer: Medicare Other | Admitting: Podiatry

## 2019-08-06 ENCOUNTER — Other Ambulatory Visit: Payer: Self-pay | Admitting: Family Medicine

## 2019-08-06 DIAGNOSIS — G894 Chronic pain syndrome: Secondary | ICD-10-CM

## 2019-08-06 DIAGNOSIS — F411 Generalized anxiety disorder: Secondary | ICD-10-CM

## 2019-08-06 MED ORDER — DULOXETINE HCL 30 MG PO CPEP: 30.0000 mg | ORAL_CAPSULE | Freq: Every day | ORAL | 1 refills | Status: DC

## 2019-08-10 ENCOUNTER — Telehealth: Payer: Self-pay | Admitting: *Deleted

## 2019-08-10 MED ORDER — TERBINAFINE HCL 250 MG PO TABS: 250.0000 mg | ORAL_TABLET | Freq: Every day | ORAL | 0 refills | Status: DC

## 2019-08-10 NOTE — Telephone Encounter (Signed)
Dr. Al Corpus requested pt be seen in office 3 months after the 05/07/2019, and I replied to pt in MyChart with those instructions.

## 2019-08-19 ENCOUNTER — Ambulatory Visit (INDEPENDENT_AMBULATORY_CARE_PROVIDER_SITE_OTHER): Payer: Medicare Other | Admitting: *Deleted

## 2019-08-19 DIAGNOSIS — R001 Bradycardia, unspecified: Secondary | ICD-10-CM

## 2019-08-19 LAB — CUP PACEART REMOTE DEVICE CHECK
Date Time Interrogation Session: 20210303104802
Implantable Lead Implant Date: 20171217
Implantable Lead Implant Date: 20171217
Implantable Lead Location: 753859
Implantable Lead Location: 753860
Implantable Lead Model: 377
Implantable Lead Model: 377
Implantable Lead Serial Number: 49336100
Implantable Lead Serial Number: 49685190
Implantable Pulse Generator Implant Date: 20171217
Pulse Gen Model: 394929
Pulse Gen Serial Number: 68775417

## 2019-08-19 NOTE — Progress Notes (Signed)
PPM Remote  

## 2019-08-24 ENCOUNTER — Other Ambulatory Visit: Payer: Self-pay

## 2019-08-24 DIAGNOSIS — H9190 Unspecified hearing loss, unspecified ear: Secondary | ICD-10-CM

## 2019-08-24 DIAGNOSIS — R55 Syncope and collapse: Secondary | ICD-10-CM

## 2019-08-26 DIAGNOSIS — G894 Chronic pain syndrome: Secondary | ICD-10-CM | POA: Diagnosis not present

## 2019-08-26 DIAGNOSIS — M4302 Spondylolysis, cervical region: Secondary | ICD-10-CM | POA: Diagnosis not present

## 2019-08-26 DIAGNOSIS — G5600 Carpal tunnel syndrome, unspecified upper limb: Secondary | ICD-10-CM | POA: Diagnosis not present

## 2019-08-26 DIAGNOSIS — M47817 Spondylosis without myelopathy or radiculopathy, lumbosacral region: Secondary | ICD-10-CM | POA: Diagnosis not present

## 2019-08-27 ENCOUNTER — Encounter: Payer: Self-pay | Admitting: Family Medicine

## 2019-08-27 ENCOUNTER — Other Ambulatory Visit: Payer: Self-pay

## 2019-08-27 ENCOUNTER — Ambulatory Visit (INDEPENDENT_AMBULATORY_CARE_PROVIDER_SITE_OTHER): Payer: Medicare Other | Admitting: Family Medicine

## 2019-08-27 VITALS — BP 138/85 | HR 75 | Temp 98.3°F | Ht 74.0 in | Wt 191.0 lb

## 2019-08-27 DIAGNOSIS — E039 Hypothyroidism, unspecified: Secondary | ICD-10-CM | POA: Diagnosis not present

## 2019-08-27 DIAGNOSIS — L299 Pruritus, unspecified: Secondary | ICD-10-CM | POA: Diagnosis not present

## 2019-08-27 NOTE — Patient Instructions (Addendum)
   Possible dry skin with eczema causing ear itching.  Okay to try hydrocortisone to the affected area, small quantity 2 at the most 3 times per day for the next 4 to 5 days.  Aveeno lotion, very small amount to any dry or irritated areas once or twice per day.  Let me know if the symptoms are not improving within the next week, sooner if worse.  I will check thyroid testing today, but if you continue to have difficulty with weight loss and that level is normal, I am happy to refer you to a nutritionist.  Let me know.    If you have lab work done today you will be contacted with your lab results within the next 2 weeks.  If you have not heard from [place] then please contact us. The fastest way to get your results is to register for My Chart.   IF you received an x-ray today, you will receive an invoice from [facility]. Please contact [facility] at [phone] with questions or concerns regarding your invoice.   IF you received labwork today, you will receive an invoice from [place]. Please contact [facility] at [phone] with questions or concerns regarding your invoice.   Our billing staff will not be able to assist you with questions regarding bills from these companies.  You will be contacted with the lab results as soon as they are available. The fastest way to get your results is to activate your My Chart account. Instructions are located on the last page of this paperwork. If you have not heard from [place] regarding the results in 2 weeks, please contact this office.

## 2019-08-27 NOTE — Progress Notes (Signed)
Subjective:  Patient ID: Mark Strong, male    DOB: [birth year]  Age: 49 y.o. MRN: [id number]  CC:  Chief Complaint  Patient presents with  . possible ear infection    started 3 weeks ago. L ear has been really itchy. no drainage and pt has tried some OVC medication with no help.   . Lab recheck    pt would like his thyroid checked again because pt is finding hard to lose waight even with exercise.    HPI Mark Strong presents for   L ear itching: Itching past 3 weeks. Sore after scratching. No change in hearing, no drainage/discharge.  Tx: bacitracin BID for past few days.  hydrocortiosone topical- once last night. Temporary improvement.  Uses corded earbuds - only once per month - used 2 weeks ago (itching prior).  No ear plugs or other FB in ear.   Difficulty with weight loss, hypothyroidism.  Has been trying to avoid sugar drinks, wathcing diet, calories, cooking healthy, avoiding fatty foods.  Taking medication daily. Synthroid 8mcg qd. Usually managed by Dr. Loanne Drilling. appt in may.  No new hot or cold intolerance. No new hair or skin changes, heart palpitations or new fatigue. No new weight changes- just trouble losing.    Wt Readings from Last 3 Encounters:  08/27/19 191 lb (86.6 kg)  07/30/19 192 lb (87.1 kg)  04/27/19 183 lb 3.2 oz (83.1 kg)   Lab Results  Component Value Date   TSH 1.36 10/28/2018       History Patient Active Problem List   Diagnosis Date Noted  . 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 Near syncope 12/18/2018  . Atypical atrial flutter ([place]) 12/18/2018  . Hyperprolactinemia ([place]) 10/28/2018  . Hypercholesterolemia 10/15/2018  . SSS (sick sinus syndrome) ([place]) 08/20/2018  . Pacemaker 05/28/2018  . OSA (obstructive sleep apnea) 05/28/2018  . 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 Hypothyroidism 04/28/2018  . Right thyroid nodule 04/28/2018  . Pain of left  hip joint 04/17/2018  . 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 Chronic back pain 03/14/2018  . Bradycardia 03/14/2018   Past Medical History:  Diagnosis Date  . Memory loss   . Pacemaker   . PVNS (pigmented villonodular synovitis)   . Sciatica   . Sleep apnea   . Thyroid disease   . Varicose veins of right leg with edema    Past Surgical History:  Procedure Laterality Date  . CARDIAC CATHETERIZATION     left and right   . INSERT / REPLACE / REMOVE PACEMAKER     Biotronik, dual-chamber, 05/18/2016 for SSS  . Right greater saphenous vein ablation Right    Allergies  Allergen Reactions  . Betadine [Povidone Iodine] Rash   Prior to Admission medications   Medication Sig Start Date End Date Taking? Authorizing Provider  atorvastatin (LIPITOR) 10 MG tablet Take 1 tablet (10 mg total) by mouth daily at 6 PM. 08/04/19  Yes Wendie Agreste, MD  baclofen (LIORESAL) 20 MG tablet Take 20 mg by mouth 2 (two) times daily.   Yes [provider]  BELBUCA 150 MCG FILM USE 1 FILM Q 12 H UTD 01/20/19  Yes [provider]  Docusate Sodium (COLACE PO) Take by mouth.   Yes [provider]  HYDROcodone-acetaminophen (NORCO/VICODIN) 5-325 MG tablet Take 1 tablet by mouth 4 (four) times daily as needed for moderate pain.   Yes [provider]  levothyroxine (  SYNTHROID) 75 MCG tablet TAKE 1 TABLET(75 MCG) BY MOUTH DAILY BEFORE BREAKFAST 05/27/19  Yes Romero Belling, MD  linaclotide [facility]) 145 MCG CAPS capsule Take 145 mcg by mouth daily before breakfast.   Yes [provider]  MELATONIN PO Take 1 capsule by mouth at bedtime. 6 mg   Yes [provider]  meloxicam (MOBIC) 15 MG tablet Take 1 tablet (15 mg total) by mouth daily. 04/27/19  Yes [name], MD  Multiple Vitamin (MULTIVITAMIN) capsule Take 1 capsule by mouth daily.   Yes [provider]  NASACORT ALLERGY 24HR 55 MCG/ACT AERO nasal inhaler USE 2-3 SPRAYS IN EACH NOSTRIL  QD UTD 01/29/19  Yes [provider]  tadalafil (CIALIS) 10 MG tablet Take 1 tablet (10 mg total) by mouth every other day as needed for erectile dysfunction. 04/14/18  Yes [name], MD  tamsulosin (FLOMAX) 0.4 MG CAPS capsule Take 1 capsule (0.4 mg total) by mouth daily. 08/04/19  Yes [name], MD  terbinafine (LAMISIL) 250 MG tablet Take 1 tablet (250 mg total) by mouth daily. 08/10/19  Yes Hyatt, Max T, DPM  zaleplon (SONATA) 5 MG capsule Take 1 capsule (5 mg total) by mouth at bedtime as needed for sleep. 04/17/19  Yes Olalere, Adewale A, MD  BELBUCA 75 MCG FILM Take 1 Film by mouth 2 (two) times daily. 150 mcg starting Jan 31, 2019 05/23/18   [provider]  Blood Pressure Monitoring (BLOOD PRESSURE CUFF) MISC 1 Device by Does not apply route once for 1 dose. 10/15/18 10/15/18  [name], [name], MD  DULoxetine (CYMBALTA) 30 MG capsule Take 1 capsule (30 mg total) by mouth daily. Patient not taking: Reported on 08/27/2019 08/06/19   [name], MD   Social History   Socioeconomic History  . Marital status: Single    Spouse name: Not on file  . Number of children: 0  . Years of education: 66  . Highest education level: 12th grade  Occupational History  . Not on file  Tobacco Use  . Smoking status: Never Smoker  . Smokeless tobacco: Never Used  Substance and Sexual Activity  . Alcohol use: Not Currently  . Drug use: Not Currently  . Sexual activity: Yes  Other Topics Concern  . Not on file  Social History Narrative   Right handed   Social Determinants of Health   Financial Resource Strain:   . Difficulty of Paying Living Expenses:   Food Insecurity:   . Worried About Programme researcher, broadcasting/film/video in the Last Year:   . Barista in the Last Year:   Transportation Needs:   . Freight forwarder (Medical):   Marland Kitchen Lack of Transportation (Non-Medical):   Physical Activity:   . Days of Exercise per Week:   . Minutes of Exercise per Session:    Stress:   . Feeling of Stress :   Social Connections:   . Frequency of Communication with Friends and Family:   . Frequency of Social Gatherings with Friends and Family:   . Attends Religious Services:   . Active Member of Clubs or Organizations:   . Attends Banker Meetings:   Marland Kitchen Marital Status:   Intimate Partner Violence:   . Fear of Current or Ex-Partner:   . Emotionally Abused:   Marland Kitchen Physically Abused:   . Sexually Abused:     Review of Systems  Per HPI  Objective:   Vitals:   08/27/19 1131  BP: 138/85  Pulse: 75  Temp: 98.3 F (36.8 C)  TempSrc: Temporal  SpO2: 100%  Weight: 191 lb (86.6 kg)  Height: 6\' 2"  (1.88 m)     Physical Exam Constitutional:      General: He is not in acute distress.    Appearance: He is well-developed.  HENT:     Head: Normocephalic and atraumatic.     Right Ear: Tympanic membrane, ear canal and external ear normal.     Left Ear: Tympanic membrane normal.     Ears:     Comments: Minimal dry skin, faint erythema at the entrance of canal without edema of canal or significant erythema of canal.  Minimal white appearing cerumen, no black dots or signs of otomycosis.  External pinna nontender without erythema or edema.  Neck:     Thyroid: No thyroid mass, thyromegaly or thyroid tenderness.  Cardiovascular:     Rate and Rhythm: Normal rate.  Pulmonary:     Effort: Pulmonary effort is normal.  Neurological:     Mental Status: He is alert and oriented to person, place, and time.       Assessment & Plan:  Remmy Riffe is a 49 y.o. male . Hypothyroidism, unspecified type - Plan: TSH  -Difficulty with weight loss.  Continue same dose of Synthroid for now, managed by endocrinology.  Will check TSH at his request, but if that is normal with continued difficulty with weight loss, may be beneficial to meet with nutritionist.  Itching of ear  -Suspected dry skin dermatitis/eczema.  No signs of otomycosis or infection at  this time.  Hydrocortisone topical, lowest effective dose, short duration.  Small amount of edema or lotion to any dry skin for now with RTC precautions.  No orders of the defined types were placed in this encounter.  Patient Instructions     Possible dry skin with eczema causing ear itching.  Okay to try hydrocortisone to the affected area, small quantity 2 at the most 3 times per day for the next 4 to 5 days.  Aveeno lotion, very small amount to any dry or irritated areas once or twice per day.  Let me know if the symptoms are not improving within the next week, sooner if worse.  I will check thyroid testing today, but if you continue to have difficulty with weight loss and that level is normal, I am happy to refer you to a nutritionist.  Let me know.    If you have lab work done today you will be contacted with your lab results within the next 2 weeks.  If you have not heard from 52 then please contact us. The fastest way to get your results is to register for My Chart.   IF you received an x-ray today, you will receive an invoice from [facility]. Please contact [facility] at [phone] with questions or concerns regarding your invoice.   IF you received labwork today, you will receive an invoice from [place]. Please contact [facility] at [phone] with questions or concerns regarding your invoice.   Our billing staff will not be able to assist you with questions regarding bills from these companies.  You will be contacted with the lab results as soon as they are available. The fastest way to get your results is to activate your My Chart account. Instructions are located on the last page of this paperwork. If you have not heard from [phone] regarding the results in 2 weeks, please contact this office.  Signed, Merri Ray, MD [facility]

## 2019-08-28 ENCOUNTER — Encounter: Payer: Self-pay | Admitting: Family Medicine

## 2019-08-28 LAB — TSH: TSH: 0.744 u[IU]/mL (ref 0.450–4.500)

## 2019-08-31 ENCOUNTER — Encounter: Payer: Self-pay | Admitting: Endocrinology

## 2019-09-01 ENCOUNTER — Other Ambulatory Visit: Payer: Self-pay | Admitting: Physician Assistant

## 2019-09-01 ENCOUNTER — Ambulatory Visit: Payer: Medicare Other

## 2019-09-01 ENCOUNTER — Other Ambulatory Visit: Payer: Self-pay

## 2019-09-01 ENCOUNTER — Ambulatory Visit (INDEPENDENT_AMBULATORY_CARE_PROVIDER_SITE_OTHER): Payer: Medicare Other | Admitting: Counselor

## 2019-09-01 ENCOUNTER — Encounter: Payer: Self-pay | Admitting: Counselor

## 2019-09-01 ENCOUNTER — Ambulatory Visit
Admission: RE | Admit: 2019-09-01 | Discharge: 2019-09-01 | Disposition: A | Discharge status: Home / Self Care | Payer: Medicare Other | Source: Ambulatory Visit | Attending: Physician Assistant | Admitting: Physician Assistant

## 2019-09-01 ENCOUNTER — Ambulatory Visit: Payer: Medicare Other | Admitting: Family Medicine

## 2019-09-01 DIAGNOSIS — F4322 Adjustment disorder with anxiety: Secondary | ICD-10-CM

## 2019-09-01 DIAGNOSIS — M25551 Pain in right hip: Secondary | ICD-10-CM

## 2019-09-01 DIAGNOSIS — F09 Unspecified mental disorder due to known physiological condition: Secondary | ICD-10-CM | POA: Diagnosis not present

## 2019-09-01 DIAGNOSIS — M25552 Pain in left hip: Secondary | ICD-10-CM

## 2019-09-01 NOTE — Progress Notes (Signed)
NEUROPSYCHOLOGICAL EVALUATION [facility]  Patient Name: Mark Strong MRN: [id number] Date of Birth: 1970-06-23 Age: 49 y.o. Education: 12 years  Referral Circumstances and Background Information  Mr. Gripp is a 49 y.o., right-hand dominant, single with a history of atrial flutter (s/p pacemaker placement in 2017), chronic pain, hyperprolactinemia, hypothyroidism and difficulties with memory and thinking over the past 3 years. He was evaluated by [facility] in 2019 for his memory and thinking problems who gave him NeuroTrax and thought he had diminished working memory ability.   On interview, Mr. Geralyn Flash stated that he has had problems with memory and thinking since at least three years ago. He noticed that he was having problems with day-to-day forgetfulness, he would read books and then not remember them, he was having a hard time with dates, and would also forget things that people tell him. He thinks that these issues have been getting worse over time. He will put things in the refridgerator that shouldn't go there, or put things back in the wrong cabinet. Sometimes, he mixes up the days of the week. He feels like his focus is off and he has a hard time keeping his mind on things. This is often associated with anxiety (carries a diagnosis of social anxiety disorder and mild depression). 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 His father's health also started to deteriorate at that time and he started having increased issues with anxiety and depression. He thinks that he has dealt with those feelings somewhat, although not entirely. He continues to deal with family health problems (his mother's health is declining). He has dreams at times about his past  relationship, he doesn't try to think about it, it just happens. With respect to current mood, he stated that he is quite anxious and feels like "everything piles on" to him. He stated that his energy is fine. He is sleeping well, he stated it varies between 6-8 hours. He stated that he usually uses his CPAP and also uses a dental appliance particularly if he is away from home. With respect to physical issues, the patient has a number of chronic medical problems. He says that his "whole back is screwed up," and that causes him a lot of pain. He has a pinched nerve in his neck and will likely need surgery in the future. He has flat arches in his feet and that contributes to his backpain. He stated that his pain level is usually about a 6 - 9 out of 10. He takes pain medication (hydrocodone 10/325 QID). His pain was rated a 7/10 at today's visit.   With respect to functioning, the patient has been on disability status since 2018 related to his back, his knees, and his pacemaker. He stated that he reads a fair amount, he watches TV at times, spends time outside. He is living independently and is adequately managing his finances, medications, and is able to use the community as needed. He does occasionally forget that he has taken his medications and will have to check himself so he doesn't double dose. He doesn't drive and has never had a driver's license because he got too anxious sitting for the exam.   Past Medical History and Review of Relevant Studies   Patient Active  Problem List   Diagnosis Date Noted  . 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 Near syncope 12/18/2018  . Atypical atrial flutter (HCC) 12/18/2018  . Hyperprolactinemia (HCC) 10/28/2018  . Hypercholesterolemia 10/15/2018  . SSS (sick sinus syndrome) (HCC) 08/20/2018  . Pacemaker 05/28/2018  . OSA (obstructive sleep apnea) 05/28/2018  . 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 Chondromalacia of right patella 05/23/2018   . Hypothyroidism 04/28/2018  . Right thyroid nodule 04/28/2018  . Pain of left hip joint 04/17/2018  . 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 Chronic back pain 03/14/2018  . Bradycardia 03/14/2018    Review of Neuroimaging and Relevant Studies:  There is no neuroimaging on file for review. Previous neurology notes reviewed, although I did not find the Neuro Trax report.   Note from Dr. Karel Jarvis reviewed, she was seeing him for hearing loss (he couldn't hear his own voice when talking), but that resolved after he started taking medication for allergies and is no longer an issue.   Current Outpatient Medications  Medication Sig Dispense Refill  . atorvastatin (LIPITOR) 10 MG tablet Take 1 tablet (10 mg total) by mouth daily at 6 PM. 90 tablet 3  . baclofen (LIORESAL) 20 MG tablet Take 20 mg by mouth 2 (two) times daily.    Marland Kitchen BELBUCA 150 MCG FILM USE 1 FILM Q 12 H UTD    . BELBUCA 75 MCG FILM Take 1 Film by mouth 2 (two) times daily. 150 mcg starting Jan 31, 2019  0  . Blood Pressure Monitoring (BLOOD PRESSURE CUFF) MISC 1 Device by Does not apply route once for 1 dose. 1 each 0  . Docusate Sodium (COLACE PO) Take by mouth.    . DULoxetine (CYMBALTA) 30 MG capsule Take 1 capsule (30 mg total) by mouth daily. (Patient not taking: Reported on 08/27/2019) 90 capsule 1  . HYDROcodone-acetaminophen (NORCO/VICODIN) 5-325 MG tablet Take 1 tablet by mouth 4 (four) times daily as needed for moderate pain.    Marland Kitchen levothyroxine (SYNTHROID) 75 MCG tablet TAKE 1 TABLET(75 MCG) BY MOUTH DAILY BEFORE BREAKFAST 90 tablet 3  . linaclotide (LINZESS) 145 MCG CAPS capsule Take 145 mcg by mouth daily before breakfast.    . MELATONIN PO Take 1 capsule by mouth at bedtime. 6 mg    . meloxicam (MOBIC) 15 MG tablet Take 1 tablet (15 mg total) by mouth daily. 30 tablet 1  . Multiple Vitamin (MULTIVITAMIN) capsule Take 1 capsule by mouth daily.    Marland Kitchen NASACORT ALLERGY 24HR 55 MCG/ACT AERO nasal inhaler USE 2-3  SPRAYS IN EACH NOSTRIL QD UTD    . tadalafil (CIALIS) 10 MG tablet Take 1 tablet (10 mg total) by mouth every other day as needed for erectile dysfunction. 30 tablet 1  . tamsulosin (FLOMAX) 0.4 MG CAPS capsule Take 1 capsule (0.4 mg total) by mouth daily. 90 capsule 3  . terbinafine (LAMISIL) 250 MG tablet Take 1 tablet (250 mg total) by mouth daily. 30 tablet 0  . zaleplon (SONATA) 5 MG capsule Take 1 capsule (5 mg total) by mouth at bedtime as needed for sleep. 30 capsule 0   No current facility-administered medications for this visit.    Family History  Problem Relation Age of Onset  . Arthritis Mother   . Depression Mother   . Heart disease Mother   . Arthritis Father   . COPD Father   . Diabetes Father   . Hyperlipidemia Father   . Hypertension Father   .  Kidney disease Father   . Thyroid disease Father   . Cancer Brother     There is a some family history of dementia, his grandmother had the condition and developed it later in life, perhaps in her 67s. There have been thoughts his father has dementia although on further discussion it sounds like it may be related to delirium (he has recurrent infections related to immune problems). His mother has a seizure disorder for most of her life. There is no  family history of psychiatric illness.  Psychosocial History  Developmental, Educational and Employment History: The patient stated that he had a normal childhood without any abuse. He grew up in [place]. In school, he stated that his parents thought he had a learning disability but that his main issue was focusing on things. He stated that he started having a harder time around 7th grade, he would get stressed out when he had to be in front of other people. In general, he gets very anxious when he has to perform. He doesn't have a license, as a result, because he gets so nervous when he is observed. It sounds like he never wanted to go to school and would resist doing his homework.  He graduated but "barely." He denied any significant history of behavior problems other than his resistance to school. He worked in [employer]  for many years, as a Clinical research associate, in the dairy, and he also was the Radio broadcast assistant of a department. His last job he held for 10 years although he became unemployed in 2015 when the company shut down and he eventually went on disability after that. He reported that he very much enjoyed working and misses it.   Psychiatric History: The patient has some involvement in mental health services, he was seeing a psychiatrist in [place] who tried Effexor and Pristiq although he is switching to Cymbalta (he had insurance changes). He hasn't tried any psychotherapy. He denied any history of suicide attempts, inpatient psychiatric hospitalization, or suicidal ideation.   Substance Use History: The patient doesn't drink alcohol current and denied any history of alcohol or substance use problems. He has never smoked.   Relationship History and Living Cimcumstances: The patient is single. He was previously engaged to a woman and helped her raise her child but she left him in 2017 and it sounds like that was a very hard breakup for him.   Mental Status and Behavioral Observations  Sensorium/Arousal: The patient's level of arousal was awake and alert.  Orientation: The patient was oriented to person, place, time, and date.  Appearance: Dressed in appropriate, casual clothing with reasonable grooming and hygiene.  Behavior: Pleasant, appropriate, did present as somewhat anxious with mild psychomotor agitation Speech/language: Speech was somewhat loud in volume, normal in rate, rhythm, prosody, and no word finding errors were observed.  Gait/Posture: Ambulated with a single point cane, antalgic quality Movement: No tremors, adventitious movements, or other overt signs/symptoms of movement problems were observed on observation.  Social Comportment: Pleasant, appropriate  within social norms Mood: Stressed, feels like "everything piles on" sometimes Affect: Anxious Thought process/content: Thought process was logical, linear, goal-oriented and Mr. Posey Rea was able to present a reasonably detailed personal history and timeline.  Safety: Thoughts of harming self or others were denied on direct questioning.  Insight: Atlee Abide Cognitive Assessment  09/01/2019  Visuospatial/ Executive (0/5) 3  Naming (0/3) 3  Attention: Read list of digits (0/2) 2  Attention: Read list of letters (0/1) 1  Attention: Serial 7 subtraction starting at 100 (0/3) 3  Language: Repeat phrase (0/2) 2  Language : Fluency (0/1) 1  Abstraction (0/2) 2  Delayed Recall (0/5) 5  Orientation (0/6) 6  Total 28  Adjusted Score (based on education) 29   Test Procedures  Wide Range Achievement Test - 4   Word Reading Wechsler Adult Intelligence Scale - IV  Digit Span  Arithmetic  Symbol Search  Coding Repeatable Battery for the Assessment of Neuropsychological Status (Form A) ACS Word Choice The Dot Counting Test Rey 15-Item and Recognition Test Controlled Oral Word Association (F-A-S) Semantic Fluency (Animals) Trail Making Test A & B Wisconsin Card Sorting Test - 64 Patient Health Questionnaire - 9  GAD-7  Plan  Izaan Kingbird was seen for a psychiatric diagnostic evaluation and neuropsychological testing. He does very well on the MoCA and on preliminary review of his testing, I think he likely has some mild executive control issues in the absence of frank cognitive impairment. I think that his multiple ongoing health issues, chronic pain, and cumulative of stress are likely explanatory of his cognitive difficulties. I think he would also likely benefit from psychiatric/psychological treatment given his level of anxiety and numerous recent life changes. We will meet in a week to discuss the findings and go over behavioral strategies for managing and preventing his  day-to-day issues. Full and complete note with impressions, recommendations, and interpretation of test data to follow.   Bettye Boeck Roseanne Reno, PsyD, ABN Clinical Neuropsychologist  Informed Consent and Coding/Compliance  Risks and benefits of the evaluation were discussed with the patient as were the limits of confidentiality. I conducted a clinical interview and neuropsychological testing (more than two tests) with [name] and Clare Charon, B.S. (Technician) assisted me in administering additional test procedures. The patient was able to tolerate the testing procedures and the patient (and/or family if applicable) is likely to benefit from further follow up to receive the diagnosis and treatment recommendations, which will be rendered at the next encounter. Billing below reflects technician time, my direct face-to-face time with the patient, time spent in test administration, and time spent in professional activities including but not limited to: neuropsychological test interpretation, integration of neuropsychological test data with clinical history, report preparation, treatment planning, care coordination, and review of diagnostically pertinent medical history or studies.   Services associated with this encounter: Clinical Interview [phone]) plus 60 minutes (62130; Neuropsychological Evaluation by Professional)  180 minutes (86578; Neuropsychological Evaluation by Professional, Adl.) 30 minutes (46962; Test Administration by Professional) 30 minutes (95284; Neuropsychological Testing by Technician) 85 minutes (13244; Neuropsychological Testing by Technician, Adl.)

## 2019-09-01 NOTE — Progress Notes (Signed)
   Psychometrist Note   Cognitive testing was administered to Mark Strong by Mark Strong, B.S. (Technician) (psychometrist) under the supervision of Alphonzo Severance, Psy.D., ABN. Mark Strong was able to tolerate all test procedures. Dr. Nicole Kindred met with the patient as needed to manage any emotional reactions to the testing procedures (if applicable). Rest breaks were offered.    The battery of tests administered was selected by Dr. Nicole Kindred with consideration to the patient's current level of functioning, the nature of his symptoms, emotional and behavioral responses during the interview, level of literacy, observed level of motivation/effort, and the nature of the referral question. This battery was communicated to the psychometrist. Communication between Dr. Nicole Kindred and the psychometrist was ongoing throughout the evaluation and Dr. Nicole Kindred was immediately accessible at all times. Dr. Nicole Kindred provided supervision to the technician on the date of this service, to the extent necessary to assure the quality of all services provided.    Mark Strong will return in approximately one week for an interactive feedback session with Dr. Nicole Kindred, at which time male test performance, clinical impressions, and treatment recommendations will be reviewed in detail. The patient understands he can contact our office should he require our assistance before this time.   A total of 115 minutes of billable time were spent with Mark Strong by the technician, including test administration and scoring time. Billing for these services is reflected in Dr. Les Pou note.   This note reflects time spent with the psychometrician and does not include test scores, clinical history, or any interpretations made by Dr. Nicole Kindred. The full report will follow in a separate note.

## 2019-09-03 ENCOUNTER — Telehealth: Payer: Self-pay | Admitting: Neurology

## 2019-09-03 NOTE — Progress Notes (Signed)
NEUROPSYCHOLOGICAL TEST SCORES [facility] Patient Name: Mark Strong MRN: [id number] Date of Birth: 24-Jan-1971 Age: 49 y.o. Education: 12 years  Measurement properties of test scores: IQ, Index, and Standard Scores (SS): Mean = 100; Standard Deviation = 15 Scaled Scores (Ss): Mean = 10; Standard Deviation = 3 Z scores (Z): Mean = 0; Standard Deviation = 1 T scores (T); Mean = 50; Standard Deviation = 10  TEST SCORES:    Note: This summary of test scores accompanies the interpretive report and should not be considered in isolation without reference to the appropriate sections in the text. Test scores are relative to age, gender, and educational history as available and appropriate.   Performance Validity        ACS: Raw Descriptor      Word Choice: 49 Within Expectation      Rey 15 and Recognition: Raw Descriptor      Free Recall 15 Within Expectation      Recognition 15 ---      False Positives 0 ---      Combined Score 30 Within Expectation      The Dot Counting Test: Raw Descriptor      E-Score 12 Within Expectation      Embedded Measures: Raw Descriptor      RBANS Effort Index: 0 Within Expectation      WAIS-IV Reliable Digit Span: 11 Within Expectation      WAIS-IV Reliable Digit Span Revised 17 Within Expectation      Expected Functioning        Wide Range Achievement Test (Word Reading): Standard/Scaled Score Percentile       Word Reading 93 32      Reynolds Intellectual Screening Test Standard/T-score Percentile      Guess What 44 28      Odd Item Out 58 79  RIST Index 102 56      Cognitive Testing        RBANS, Form : Standard/Scaled Score Percentile  Total Score 87 19  Immediate Memory 97 42      List Learning 8 25      Story Memory 11 63  Visuospatial/Constructional 89 23      Figure Copy 10 50      Line Orientation --- 17-25  Language 97 42      Picture Naming --- 51-75      Semantic Fluency 9 37  Attention 94 34      Digit  Span 8 25      Coding 10 50  Delayed Memory 81 10      List Recall --- 3-9      List Recognition --- 3-9      Story Recall 9 37      Figure Recall 9 37      Wechsler Adult Intelligence Scale - IV: Standard/Scaled Score Percentile  Working Memory Index 100 50      Digit Span 12 75          Digit Span Forward 10 50          Digit Span Backward 13 84          Digit Span Sequencing 12 75      Arithmetic 8 25  Processing Speed Index 89 23      Symbol Search 8 25      Coding 8 25      Verbal Fluency: T-score Percentile      Controlled Oral Word Association (  F-A-S) 42 21      Semantic Fluency (Animals) 48 42      Trail Making Test: T-Score Percentile      Part A 46 34      Part B 40 16      Modified Wisconsin Card Sorting Test (MWCST): Standard/T-Score Percentile      Number of Categories Correct 57 76      Number of Perseverative Errors 52 58      Number of Total Errors 48 43      Percent Perseverative Errors 56 73  Executive Function Composite 109 73      Boston Diagnostic Aphasia Exam: Raw Score Scaled Score      Complex Ideational Material 9 5      Clock Drawing Raw Score Descriptor      Command 6 Mild Impairment      Rating Scales         Raw Score Descriptor  Patient Health Questionnaire - 9 10 Moderate Depression  GAD-7 5 Mild Anxiety    Peter V. Nicole Kindred PsyD, [place] Clinical Neuropsychologist

## 2019-09-03 NOTE — Telephone Encounter (Signed)
Patient called in regarding some information that he has to give Dr. Karel Jarvis and possibly Dr. Roseanne Reno. He said that it is regarding him as a child and Neuropsych info that his mother told him? Please call. Thank you

## 2019-09-03 NOTE — Telephone Encounter (Signed)
FYI before next appointment.. Pt called stated that he forgot to let Dr. Roseanne Reno know that he was in special Ed classes for 2 years while in school, and while in school they said he had ADD but he was never tested for it.

## 2019-09-03 NOTE — Telephone Encounter (Signed)
Mr. Mark Strong information is noted, thank you. We will discuss his findings, which will integrate that and all the other information gathered, at his follow up on 09/09/2019.

## 2019-09-07 NOTE — Progress Notes (Signed)
[facility]  Patient Name: Mark Strong MRN: [id number] Date of Birth: [birth year] Age: 49 y.o. Education: 65 years  Clinical Impressions  Ean Gettel is a 49 y.o., right-hand dominant, single man with a history of sick sinus syndrome (s/p pacemaker placement in 2017), OSA (on CPAP), chronic pain, social anxiety/depression and possible ADHD by patient report. He has been having memory and thinking problems over approximately the past 3 years. He mostly notices day-to-day forgetfulness, difficulties retaining what he reads, and placing things back in the wrong place. He has had many psychosocial stressors as of late, losing his job in 2015, breaking up with a long term girlfriend around that time, having bradycardia requiring pacemaker placement in 2017, and he recently went out on disability related to his back and other problems in 2018.  Neuropsychological testing shows evidence of some mild dysfunction primarily involving executive control. Mr. Rudie Meyer memory test performance is also suggestive of executive control problems given that he had a hard time recalling unstructured verbal information but did adequately when asked to learn and recall structured verbal information. He is not demonstrating any frank impairment. He reported moderate levels of symptoms associated with depression and mild levels of anxiety symptoms.   The overall impression is that of executive control problems, which is highly consistent with his report of day-to-day difficulties. My sense is that Mr. Rudie Meyer executive control issues are mostly due to modifiable risk factors and contributors such as distraction related to chronic pain, his multiple medical conditions, and particularly anxiety/affective issues that are to some extent understandable given many stressful situations over the past several years. Medication side effects are also a possibility. Premorbid  issues such as ADHD may also be at play although that was not a focus of our consultation and it is not clear that he would warrant a formal diagnosis from the information available at the time of this report.   Diagnostic Impressions: Adjustment disorder with mixed anxiety and depressed mood Subjective cognitive impairment  Recommendations to be discussed with patient  Your performance and presentation on assessment were consistent with fairly GOOD performance overall. This is positive and is reassuring about your brain's ability to function normally from a cognitive perspective under ideal circumstances. There were suggestions of some mild difficulties with what is called executive control, however. Executive control problems are probably the most common issue I encounter in younger individuals and are nonspecific findings that often respond to treatment of underlying causes.   Executive control is a higher order cognitive ability involved in regulating other cognitive resources. Much like the conductor of an orchestra coordinates multiple instruments to make music, executive capacities coordinate other lower-order skills (e.g., movement, language, attention) to form complex human behaviors. Individuals with executive control problems are often capable of doing most of the things they did before they were having problems, but they may not do so as effortlessly, efficiently, and consistently. These difficulties often manifest as problems tracking information, multitasking, and paying attention. Executive control problems often result in cognitive inefficiency and can present as "memory problems," because they decrease encoding and spontaneous retrieval of information.   In your case, I think that your executive control problems are multifactorial and will likely improve with improvement in your overall health and life circumstances. Pain is particularly detrimental to executive control, by both  decreasing attention to the world around you, being a source of distraction, and promoting negative emotions like anger and irritability that can interfere with  executive control. Be aware that the experience of having chronic pain is present even when pain is not and that there may not be a 1:1 correlation between your pain level and your memory problems.   Unfortunately, treating pain with strong medications such as hydrocodone can contribute to cognitive problems. There is a cost benefit analysis that must be carefully considered that only you and a prescribing physician can make. You should know it may not be able to adequately control all your symptoms without some cognitive side effects.   Having a period of poor health and multiple medical conditions is to some extent a traumatizing experience and naturally makes one preoccupied with their health. In your case, you also experienced numerous losses including the loss of a relationship, the loss of your job, and a total change of your life circumstances. You reported that you do not feel particularly depressed or anxious, yet you reported a moderate level of symptoms associated with depression on self-rating scales. Aspects of your presentation make me think that you might benefit significantly from mental health treatment to discuss your numerous health challenges as of late and changes in life circumstances.   When we have health problems, the focus of life naturally becomes regaining the state of good health that we have lost. This is adaptive and makes sense, because health is important to everything that we do. It can cause further problems, however, if you are unable to get out of your poor health and back into your life after you have exhausted all treatment options or if you are dealing with chronic health problems that cannot be cured. This can contribute to feelings of anxiety, depression, and negative affective states. I would encourage you to  the extent possible to fill your life with rewarding and meaningful activities and to take some time away from dealing with your health problems when at all possible.  As a general recommendation, I would suggest that you engage in healthy lifestyle behaviors including getting recommended (usually 8 hours) amounts of sleep, eating a healthy diet rich in unprocessed foods such as vegetables and whole grains, assertively monitor and manage underlying medical conditions that can contribute to cognitive impairment (e.g., hypertension, high cholesterol, etc.), getting regular exercise (preferably 20-30 minutes a day), and engaging in satisfying social interactions with others.   For problems with attention and concentration, consider the following recommendations:  Build routines into your day and stick to them, doing the same thing at the same time helps your body get into a rhythm that makes it harder to forget something you need to do.   Use sticky notes, reminders, a calendar, or your smart phone to provide yourself with reminders, to do lists, and help track appointments.   When you need to do work for school or other things, create an environment that is conducive to that work. This may include putting electronic devices that can be distracting outside the room and working in an area that is quiet and free from distractions.   Break things down into smaller steps to help get started and stop yourself from feeling overwhelmed.   Plan breaks throughout the day where you can get up and move even if it's for just a few minutes at a time.   Focus your attention on only one thing and avoid multitasking. Although some people are better at it than others, nobody's task performance is as good as it could be when they are alternating attention between two different tasks.  Stay mindful throughout your day and monitor whether you are feeling overwhelmed or disorganized to identify problems before they  happen.   Avoid working under time pressure when you may be more liable to make mistakes.    Test Findings  Test scores are summarized in additional documentation associated with this encounter. Test scores are relative to age, gender, and educational history as available and appropriate. There were no concerns about performance validity as all findings fell within normal expectations.   General Intellectual Functioning/Achievement:  Performance on measures of overall intellectual ability fell within the average range and suggest that is a reasonable standard of comparison for Mr. Verlin Dike cognitive test performance. On the [employer], performance was high average on the more visually oriented portion whereas verbal performance was average. Single word reading was average.   Attention and Processing Efficiency: Performance on indicators of attention and processing efficiency was reasonable with adequate average range performance overall on the Working Memory Index of the WAIS-IV and low average performance on the Processing Speed Index.   Performance on digit repetition forward, digit resequencing in ascending order, and mental solving of arithmetical word problems without paper and pencil was average. Performance was high average on digit resequencing in ascending order.   Timed performance on processing speed measures was average on two different digit-symbol coding indicators and one measure involving efficient visual scanning and efficient visual matching.   Language: Language findings were within normal limits with intact visual object confrontation naming, low average phonemic fluency, and average semantic fluency.   Visuospatial Function: Performance on visuospatial and constructional measures fell at a reasonable low average level with average figure copy and low average to average judgment of angular line orientations.   Learning and Memory: Performance on  measures of learning and memory was reasonable if not a bit weak with the profile suggesting some difficulties with retaining unstructured as compared to structured verbal information.   In the verbal realm, immediate recall for material including a 10-item word list and brief short story was average. Delayed free recall for the word list was weak and fell at an unusually low level with comparable delayed recognition performance. Delayed recall for the short story was better and fell at an average level. Delayed recall of a modestly complex figure stimulus was average.   Executive Functions: Performance on executive indicators was within normal limits overall but there were a few weak scores on a measure involving attending to and then reasoning with verbally presented information, which was unusually low, and on clock drawing, which was suggestive of "mild impairment." By contrast, generation of words in response to the letters F-A-S and alternating sequencing of numbers and letters of the alphabet was low average. Performance on the modified wisconsin card sorting test, a rule-based categorization procedure involving cognitive flexibility and abstract thinking was at the top of the average range.   Rating Scale(s): Mr. Riano reported moderate levels of depressive symptoms and mild levels of anxiety symptoms on self-rating scales.   Bettye Boeck Roseanne Reno PsyD, ABN Clinical Neuropsychologist

## 2019-09-08 ENCOUNTER — Ambulatory Visit: Payer: Medicare Other | Admitting: Podiatry

## 2019-09-08 ENCOUNTER — Encounter: Payer: Self-pay | Admitting: Podiatry

## 2019-09-08 ENCOUNTER — Other Ambulatory Visit: Payer: Self-pay

## 2019-09-08 DIAGNOSIS — M722 Plantar fascial fibromatosis: Secondary | ICD-10-CM | POA: Diagnosis not present

## 2019-09-08 DIAGNOSIS — L603 Nail dystrophy: Secondary | ICD-10-CM

## 2019-09-08 NOTE — Progress Notes (Signed)
He presents today for follow-up of his nail fungus he says the toenails are looking better but my feet of been hurting me.  He says the orthopedic doctors are doing an MRI of his left hip and thigh because they saw a lesion.  He states that he has 18 pills left referring to Lamisil.  Objective: Vital signs are stable he is alert and oriented x3.  Pulses are strongly palpable bilateral capillary fill time is immediate.  Neurologic sensorium is intact.  He has pain on palpation medial calcaneal tubercles bilateral.  Nails are looking great with exception of the hallux left which does demonstrate some nail dystrophy and some residual onychomycosis as it is growing out.  Assessment: Plantar fasciitis bilaterally onychomycosis hallux left.  Plan: Discussed etiology pathology and surgical therapies at this point in time went ahead and injected his bilateral heels 20 mg Kenalog tomograms Marcaine point maximal tenderness bilaterally I encouraged him to continue his other 18 pills 1 tablet every other day.  I want him to get some new shoes and we will reevaluate him in 2 months.

## 2019-09-09 ENCOUNTER — Encounter: Payer: Self-pay | Admitting: Counselor

## 2019-09-09 ENCOUNTER — Telehealth (INDEPENDENT_AMBULATORY_CARE_PROVIDER_SITE_OTHER): Payer: Medicare Other | Admitting: Counselor

## 2019-09-09 DIAGNOSIS — R4189 Other symptoms and signs involving cognitive functions and awareness: Secondary | ICD-10-CM | POA: Diagnosis not present

## 2019-09-09 DIAGNOSIS — F4323 Adjustment disorder with mixed anxiety and depressed mood: Secondary | ICD-10-CM | POA: Diagnosis not present

## 2019-09-09 NOTE — Patient Instructions (Signed)
Your performance and presentation on assessment were consistent with fairly GOOD performance overall. This is positive and is reassuring about your brain's ability to function normally from a cognitive perspective under ideal circumstances. There were suggestions of some mild difficulties with what is called executive control, however. Executive control problems are probably the most common issue I encounter in younger individuals and are nonspecific findings that often respond to treatment of underlying causes.   Executive control is a higher order cognitive ability involved in regulating other cognitive resources. Much like the conductor of an orchestra coordinates multiple instruments to make music, executive capacities coordinate other lower-order skills (e.g., movement, language, attention) to form complex human behaviors. Individuals with executive control problems are often capable of doing most of the things they did before they were having problems, but they may not do so as effortlessly, efficiently, and consistently. These difficulties often manifest as problems tracking information, multitasking, and paying attention. Executive control problems often result in cognitive inefficiency and can present as "memory problems," because they decrease encoding and spontaneous retrieval of information.   In your case, I think that your executive control problems are multifactorial and will likely improve with improvement in your overall health and life circumstances. Pain is particularly detrimental to executive control, by both decreasing attention to the world around you, being a source of distraction, and promoting negative emotions like anger and irritability that can interfere with executive control. Be aware that the experience of having chronic pain is present even when pain is not and that there may not be a 1:1 correlation between your pain level and your memory problems.   Unfortunately, treating  pain with strong medications such as hydrocodone can contribute to cognitive problems. There is a cost benefit analysis that must be carefully considered that only you and a prescribing physician can make. You should know it may not be able to adequately control all your symptoms without some cognitive side effects.   Having a period of poor health and multiple medical conditions is to some extent a traumatizing experience and naturally makes one preoccupied with their health. In your case, you also experienced numerous losses including the loss of a relationship, the loss of your job, and a total change of your life circumstances. You reported that you do not feel particularly depressed or anxious, yet you reported a moderate level of symptoms associated with depression on self-rating scales. Aspects of your presentation make me think that you might benefit significantly from mental health treatment to discuss your numerous health challenges as of late and changes in life circumstances.   We discussed a referral for psychotherapy, which you accepted and I have made a referral to [facility] at [facility].   When we have health problems, the focus of life naturally becomes regaining the state of good health that we have lost. This is adaptive and makes sense, because health is important to everything that we do. It can cause further problems, however, if you are unable to get out of your poor health and back into your life after you have exhausted all treatment options or if you are dealing with chronic health problems that cannot be cured. This can contribute to feelings of anxiety, depression, and negative affective states. I would encourage you to the extent possible to fill your life with rewarding and meaningful activities and to take some time away from dealing with your health problems when at all possible.  As a general recommendation, I  would suggest that you engage in healthy  lifestyle behaviors including getting recommended (usually 8 hours) amounts of sleep, eating a healthy diet rich in unprocessed foods such as vegetables and whole grains, assertively monitor and manage underlying medical conditions that can contribute to cognitive impairment (e.g., hypertension, high cholesterol, etc.), getting regular exercise (preferably 20-30 minutes a day), and engaging in satisfying social interactions with others.   For problems with attention and concentration, consider the following recommendations:  Build routines into your day and stick to them, doing the same thing at the same time helps your body get into a rhythm that makes it harder to forget something you need to do.   Use sticky notes, reminders, a calendar, or your smart phone to provide yourself with reminders, to do lists, and help track appointments.   When you need to do work for school or other things, create an environment that is conducive to that work. This may include putting electronic devices that can be distracting outside the room and working in an area that is quiet and free from distractions.   Break things down into smaller steps to help get started and stop yourself from feeling overwhelmed.   Plan breaks throughout the day where you can get up and move even if it's for just a few minutes at a time.   Focus your attention on only one thing and avoid multitasking. Although some people are better at it than others, nobody's task performance is as good as it could be when they are alternating attention between two different tasks.   Stay mindful throughout your day and monitor whether you are feeling overwhelmed or disorganized to identify problems before they happen.   Avoid working under time pressure when you may be more liable to make mistakes.    We discussed ADHD and the possibility that you may have ADHD, although the evaluation was not ordered specifically to assess for that and a more  detailed clinical history would be needed. We also discussed that medications for ADHD can exacerbate anxiety and can also potentially carry some cardiac risk and as such may not be a good next step for you. I would encourage you to further discuss the possibility of ADHD with your behavioral health provider who could conduct further workup if desired.

## 2019-09-09 NOTE — Progress Notes (Signed)
[facility]  I met with [name] to review the findings resulting from his neuropsychological evaluation. Since the last appointment, he feels like things are getting worse. He presented as somewhat pressured and worried regarding his cognitive errors, which I reflected to him. We had a very productive conversation about the role that stress, anxiety, psychosocial changes, chronic pain, and overfocus can have on cognition. I reassured him that there is an exceedingly low risk for dementia in his age group and his test data is not concerning for a dementia process. Time was spent reviewing the impressions and recommendations that are detailed in the evaluation report. Interventions provided during this encounter included psychoeducation, empathic reflection, and others as reflected in the patient instructions. I took time to explain the findings and answer all the patient's questions. I encouraged Mr. Kluender to contact me should he have any further questions or if further follow up is desired.   Current Medications and Medical History   Current Outpatient Medications  Medication Sig Dispense Refill  . atorvastatin (LIPITOR) 10 MG tablet Take 1 tablet (10 mg total) by mouth daily at 6 PM. 90 tablet 3  . baclofen (LIORESAL) 20 MG tablet Take 20 mg by mouth 2 (two) times daily.    Marland Kitchen BELBUCA 150 MCG FILM USE 1 FILM Q 12 H UTD    . BELBUCA 75 MCG FILM Take 1 Film by mouth 2 (two) times daily. 150 mcg starting Jan 31, 2019  0  . Blood Pressure Monitoring (BLOOD PRESSURE CUFF) MISC 1 Device by Does not apply route once for 1 dose. 1 each 0  . Docusate Sodium (COLACE PO) Take by mouth.    . DULoxetine (CYMBALTA) 30 MG capsule Take 1 capsule (30 mg total) by mouth daily. (Patient not taking: Reported on 08/27/2019) 90 capsule 1  . HYDROcodone-acetaminophen (NORCO/VICODIN) 5-325 MG tablet Take 1 tablet by mouth 4 (four) times daily as needed for moderate  pain.    Marland Kitchen levothyroxine (SYNTHROID) 75 MCG tablet TAKE 1 TABLET(75 MCG) BY MOUTH DAILY BEFORE BREAKFAST 90 tablet 3  . linaclotide (LINZESS) 145 MCG CAPS capsule Take 145 mcg by mouth daily before breakfast.    . MELATONIN PO Take 1 capsule by mouth at bedtime. 6 mg    . meloxicam (MOBIC) 15 MG tablet Take 1 tablet (15 mg total) by mouth daily. 30 tablet 1  . Multiple Vitamin (MULTIVITAMIN) capsule Take 1 capsule by mouth daily.    Marland Kitchen NASACORT ALLERGY 24HR 55 MCG/ACT AERO nasal inhaler USE 2-3 SPRAYS IN EACH NOSTRIL QD UTD    . tadalafil (CIALIS) 10 MG tablet Take 1 tablet (10 mg total) by mouth every other day as needed for erectile dysfunction. 30 tablet 1  . tamsulosin (FLOMAX) 0.4 MG CAPS capsule Take 1 capsule (0.4 mg total) by mouth daily. 90 capsule 3  . terbinafine (LAMISIL) 250 MG tablet Take 1 tablet (250 mg total) by mouth daily. 30 tablet 0  . zaleplon (SONATA) 5 MG capsule Take 1 capsule (5 mg total) by mouth at bedtime as needed for sleep. 30 capsule 0   No current facility-administered medications for this visit.    Patient Active Problem List   Diagnosis Date Noted  . 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 Near syncope 12/18/2018  . Atypical atrial flutter ([place]) 12/18/2018  . Hyperprolactinemia ([place]) 10/28/2018  . Hypercholesterolemia 10/15/2018  . SSS (sick sinus syndrome) ([place]) 08/20/2018  . Pacemaker 05/28/2018  . OSA (  obstructive sleep apnea) 05/28/2018  . 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 Hypothyroidism 04/28/2018  . Right thyroid nodule 04/28/2018  . Pain of left hip joint 04/17/2018  . 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 Chronic back pain 03/14/2018  . Bradycardia 03/14/2018   Mental Status and Behavioral Observations  Mr. Bonenberger was available on time for this televideo appointment and attended alone. He was alert and oriented to all spheres. His speech was fast in rate, normal  in volume and prosody. He stated that his current mood is, "better now, with someone who has a different direction to go in" and his affect was initially anxious, brightening somewhat throughout the encounter. Thought process was logical, linear, and goal-oriented. Thought content was appropriate.   Plan  Feedback provided regarding the patient's neuropsychological evaluation. Mr. Kuenzel presented as very appreciative of our encounter and accepted a referral for behavioral health at [facility], which I think will be very helpful for him. He is an excellent candidate for psychotherapy and has a good prognosis with appropriate treatment.   Viviano Simas Nicole Kindred, PsyD, ABN Clinical Neuropsychologist  Service(s) Provided at This Encounter: 27 minutes [phone]; Psychotherapy with patient/family)

## 2019-09-11 ENCOUNTER — Telehealth: Payer: Self-pay | Admitting: Pulmonary Disease

## 2019-09-11 NOTE — Telephone Encounter (Signed)
Ov notes printed and placed in outgoing mail to address on file.  Left detailed message on named voicemail making pt aware.  Nothing further needed at this time- will close encounter.

## 2019-09-25 DIAGNOSIS — Z79891 Long term (current) use of opiate analgesic: Secondary | ICD-10-CM | POA: Diagnosis not present

## 2019-09-25 DIAGNOSIS — M4302 Spondylolysis, cervical region: Secondary | ICD-10-CM | POA: Diagnosis not present

## 2019-09-25 DIAGNOSIS — G894 Chronic pain syndrome: Secondary | ICD-10-CM | POA: Diagnosis not present

## 2019-09-25 DIAGNOSIS — G5600 Carpal tunnel syndrome, unspecified upper limb: Secondary | ICD-10-CM | POA: Diagnosis not present

## 2019-09-25 DIAGNOSIS — Z79899 Other long term (current) drug therapy: Secondary | ICD-10-CM | POA: Diagnosis not present

## 2019-09-25 DIAGNOSIS — M47817 Spondylosis without myelopathy or radiculopathy, lumbosacral region: Secondary | ICD-10-CM | POA: Diagnosis not present

## 2019-09-30 ENCOUNTER — Telehealth: Payer: Self-pay | Admitting: Counselor

## 2019-09-30 ENCOUNTER — Other Ambulatory Visit (HOSPITAL_COMMUNITY): Payer: Self-pay | Admitting: Physician Assistant

## 2019-09-30 ENCOUNTER — Other Ambulatory Visit: Payer: Self-pay | Admitting: Physician Assistant

## 2019-09-30 DIAGNOSIS — G5722 Lesion of femoral nerve, left lower limb: Secondary | ICD-10-CM

## 2019-09-30 NOTE — Telephone Encounter (Signed)
Patient called requesting a call back from Dr. Roseanne Reno about his recommendation to see a psychologist. He said there aren't any in this area, [place], in-network. He'd like some alternate suggestions, if available.

## 2019-10-01 ENCOUNTER — Ambulatory Visit (INDEPENDENT_AMBULATORY_CARE_PROVIDER_SITE_OTHER): Payer: Medicare Other | Admitting: Psychology

## 2019-10-01 DIAGNOSIS — F411 Generalized anxiety disorder: Secondary | ICD-10-CM | POA: Diagnosis not present

## 2019-10-02 NOTE — Telephone Encounter (Signed)
As per the referral request, it looks like he was scheduled to see Dr. Felipa Furnace on 04/15 and did see Dr. Felipa Furnace on that date. I attempted to contact Mr. Cosby but did not reach him and could not leave a message.

## 2019-10-02 NOTE — Telephone Encounter (Signed)
Ok thank you will let you know if he calls back

## 2019-10-08 ENCOUNTER — Ambulatory Visit (INDEPENDENT_AMBULATORY_CARE_PROVIDER_SITE_OTHER): Payer: Medicare Other | Admitting: Psychology

## 2019-10-08 DIAGNOSIS — F411 Generalized anxiety disorder: Secondary | ICD-10-CM | POA: Diagnosis not present

## 2019-10-14 DIAGNOSIS — G894 Chronic pain syndrome: Secondary | ICD-10-CM | POA: Diagnosis not present

## 2019-10-14 DIAGNOSIS — M792 Neuralgia and neuritis, unspecified: Secondary | ICD-10-CM | POA: Diagnosis not present

## 2019-10-21 ENCOUNTER — Other Ambulatory Visit: Payer: Self-pay

## 2019-10-21 ENCOUNTER — Ambulatory Visit (HOSPITAL_COMMUNITY)
Admission: RE | Admit: 2019-10-21 | Discharge: 2019-10-21 | Disposition: A | Discharge status: Home / Self Care | Payer: Medicare Other | Source: Ambulatory Visit | Attending: Physician Assistant | Admitting: Physician Assistant

## 2019-10-21 DIAGNOSIS — Z950 Presence of cardiac pacemaker: Secondary | ICD-10-CM | POA: Diagnosis not present

## 2019-10-21 DIAGNOSIS — G5722 Lesion of femoral nerve, left lower limb: Secondary | ICD-10-CM | POA: Insufficient documentation

## 2019-10-21 DIAGNOSIS — M25552 Pain in left hip: Secondary | ICD-10-CM | POA: Diagnosis not present

## 2019-10-21 MED ORDER — GADOBUTROL 1 MMOL/ML IV SOLN
9.0000 mL | Freq: Once | INTRAVENOUS | Status: AC | PRN
  Administered 2019-10-21: 9 mL via INTRAVENOUS

## 2019-10-22 ENCOUNTER — Ambulatory Visit (INDEPENDENT_AMBULATORY_CARE_PROVIDER_SITE_OTHER): Payer: Medicare Other | Admitting: Psychology

## 2019-10-22 DIAGNOSIS — F411 Generalized anxiety disorder: Secondary | ICD-10-CM | POA: Diagnosis not present

## 2019-10-23 DIAGNOSIS — M47817 Spondylosis without myelopathy or radiculopathy, lumbosacral region: Secondary | ICD-10-CM | POA: Diagnosis not present

## 2019-10-23 DIAGNOSIS — M4302 Spondylolysis, cervical region: Secondary | ICD-10-CM | POA: Diagnosis not present

## 2019-10-23 DIAGNOSIS — G894 Chronic pain syndrome: Secondary | ICD-10-CM | POA: Diagnosis not present

## 2019-10-23 DIAGNOSIS — G5600 Carpal tunnel syndrome, unspecified upper limb: Secondary | ICD-10-CM | POA: Diagnosis not present

## 2019-10-26 ENCOUNTER — Other Ambulatory Visit: Payer: Self-pay

## 2019-10-28 ENCOUNTER — Other Ambulatory Visit: Payer: Self-pay

## 2019-10-28 ENCOUNTER — Encounter: Payer: Self-pay | Admitting: Endocrinology

## 2019-10-28 ENCOUNTER — Ambulatory Visit: Payer: Medicare Other | Admitting: Endocrinology

## 2019-10-28 VITALS — BP 110/80 | HR 114 | Ht 74.0 in | Wt 193.0 lb

## 2019-10-28 DIAGNOSIS — E221 Hyperprolactinemia: Secondary | ICD-10-CM

## 2019-10-28 LAB — TSH: TSH: 1.3 u[IU]/mL (ref 0.35–4.50)

## 2019-10-28 LAB — PROLACTIN: Prolactin: 13.2 ng/mL (ref 2.0–18.0)

## 2019-10-28 NOTE — Patient Instructions (Addendum)
A blood test is requested for you today.  We'll let you know about the results.  You don't need to recheck the ultrasound in the future, unless you or I have some reason to suspect enlargement of the nodules.   Please continue the same medication.  It is best to never miss the medication.  However, if you do miss it, next best is to double up the next time.   Please come back for a follow-up appointment in 1 year.

## 2019-10-28 NOTE — Progress Notes (Signed)
Subjective:    Patient ID: Mark Strong, male    DOB: 16-Aug-1970, 49 y.o.   MRN: [id number]  HPI Pt returns for f/u of MNG and hypothyroidism (hypothyroidism was dx'ed in 2016; he had a bx in mid-2019; he says pathol recommended a repeat, due to inadeq material, but he chose to follow with serial [place] instead; F/u US in 2019 showed no nodules which met criteria for biopsy nor follow-up).  He does not notice the goiter.  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  He also has h/o elev PSA Past Medical History:  Diagnosis Date  . Memory loss   . Pacemaker   . PVNS (pigmented villonodular synovitis)   . Sciatica   . Sleep apnea   . Thyroid disease   . Varicose veins of right leg with edema     Past Surgical History:  Procedure Laterality Date  . CARDIAC CATHETERIZATION     left and right   . INSERT / REPLACE / REMOVE PACEMAKER     Biotronik, dual-chamber, 05/18/2016 for SSS  . Right greater saphenous vein ablation Right     Social History   Socioeconomic History  . Marital status: Single    Spouse name: Not on file  . Number of children: 0  . Years of education: 59  . Highest education level: 12th grade  Occupational History  . Not on file  Tobacco Use  . Smoking status: Never Smoker  . Smokeless tobacco: Never Used  Substance and Sexual Activity  . Alcohol use: Not Currently  . Drug use: Not Currently  . Sexual activity: Yes  Other Topics Concern  . Not on file  Social History Narrative   Right handed   Social Determinants of Health   Financial Resource Strain:   . Difficulty of Paying Living Expenses:   Food Insecurity:   . Worried About Charity fundraiser in the Last Year:   . Arboriculturist in the Last Year:   Transportation Needs:   . Film/video editor (Medical):   Marland Kitchen Lack of  Transportation (Non-Medical):   Physical Activity:   . Days of Exercise per Week:   . Minutes of Exercise per Session:   Stress:   . Feeling of Stress :   Social Connections:   . Frequency of Communication with Friends and Family:   . Frequency of Social Gatherings with Friends and Family:   . Attends Religious Services:   . Active Member of Clubs or Organizations:   . Attends Archivist Meetings:   Marland Kitchen Marital Status:   Intimate Partner Violence:   . Fear of Current or Ex-Partner:   . Emotionally Abused:   Marland Kitchen Physically Abused:   . Sexually Abused:     Current Outpatient Medications on File Prior to Visit  Medication Sig Dispense Refill  . atorvastatin (LIPITOR) 10 MG tablet Take 1 tablet (10 mg total) by mouth daily at 6 PM. 90 tablet 3  . baclofen (LIORESAL) 20 MG tablet Take 20 mg by mouth 2 (two) times daily.    Mariane Baumgarten Sodium (COLACE PO) Take by mouth.    . DULoxetine (CYMBALTA) 30 MG capsule Take 1 capsule (30 mg total) by mouth daily. 90 capsule 1  . HYDROcodone-acetaminophen (NORCO/VICODIN) 5-325 MG tablet Take 1 tablet by mouth 4 (four) times daily as needed for moderate pain.    Marland Kitchen  levothyroxine (SYNTHROID) 75 MCG tablet TAKE 1 TABLET(75 MCG) BY MOUTH DAILY BEFORE BREAKFAST 90 tablet 3  . linaclotide (LINZESS) 145 MCG CAPS capsule Take 145 mcg by mouth daily before breakfast.    . MELATONIN PO Take 1 capsule by mouth at bedtime. 6 mg    . meloxicam (MOBIC) 15 MG tablet Take 1 tablet (15 mg total) by mouth daily. 30 tablet 1  . Multiple Vitamin (MULTIVITAMIN) capsule Take 1 capsule by mouth daily.    Marland Kitchen NASACORT ALLERGY 24HR 55 MCG/ACT AERO nasal inhaler USE 2-3 SPRAYS IN EACH NOSTRIL QD UTD    . tadalafil (CIALIS) 10 MG tablet Take 1 tablet (10 mg total) by mouth every other day as needed for erectile dysfunction. 30 tablet 1  . tamsulosin (FLOMAX) 0.4 MG CAPS capsule Take 1 capsule (0.4 mg total) by mouth daily. 90 capsule 3  . terbinafine (LAMISIL) 250 MG  tablet Take 1 tablet (250 mg total) by mouth daily. 30 tablet 0  . zaleplon (SONATA) 5 MG capsule Take 1 capsule (5 mg total) by mouth at bedtime as needed for sleep. 30 capsule 0  . Blood Pressure Monitoring (BLOOD PRESSURE CUFF) MISC 1 Device by Does not apply route once for 1 dose. 1 each 0   No current facility-administered medications on file prior to visit.    Allergies  Allergen Reactions  . Betadine [Povidone Iodine] Rash    Family History  Problem Relation Age of Onset  . Arthritis Mother   . Depression Mother   . Heart disease Mother   . Arthritis Father   . COPD Father   . Diabetes Father   . Hyperlipidemia Father   . Hypertension Father   . Kidney disease Father   . Thyroid disease Father   . Cancer Brother     BP 110/80   Pulse (!) 114   Ht _0  (1.88 m)   Wt 193 lb (87.5 kg)   SpO2 99%   BMI 24.78 kg/m    Review of Systems Denies neck pain.    Objective:   Physical Exam VITAL SIGNS:  See vs page GENERAL: no distress NECK: There is no palpable thyroid enlargement.  No thyroid nodule is palpable.  No palpable lymphadenopathy at the anterior neck.   Lab Results  Component Value Date   TSH 1.30 10/28/2019      Assessment & Plan:  Hyperprolactinemia: recheck today MNG: we discussed low risk of malignancy Hypothyroidism: well-replaced.  Please continue the same medication.   Patient Instructions  A blood test is requested for you today.  We'll let you know about the results.  You don't need to recheck the ultrasound in the future, unless you or I have some reason to suspect enlargement of the nodules.   Please continue the same medication.  It is best to never miss the medication.  However, if you do miss it, next best is to double up the next time.   Please come back for a follow-up appointment in 1 year.

## 2019-11-05 ENCOUNTER — Ambulatory Visit (INDEPENDENT_AMBULATORY_CARE_PROVIDER_SITE_OTHER): Payer: Medicare Other | Admitting: Psychology

## 2019-11-05 DIAGNOSIS — F411 Generalized anxiety disorder: Secondary | ICD-10-CM | POA: Diagnosis not present

## 2019-11-10 ENCOUNTER — Other Ambulatory Visit: Payer: Self-pay | Admitting: Podiatry

## 2019-11-10 ENCOUNTER — Ambulatory Visit: Payer: Medicare Other | Admitting: Podiatry

## 2019-11-10 ENCOUNTER — Encounter: Payer: Self-pay | Admitting: Podiatry

## 2019-11-10 ENCOUNTER — Ambulatory Visit (INDEPENDENT_AMBULATORY_CARE_PROVIDER_SITE_OTHER): Payer: Medicare Other

## 2019-11-10 ENCOUNTER — Other Ambulatory Visit: Payer: Self-pay | Admitting: *Deleted

## 2019-11-10 ENCOUNTER — Other Ambulatory Visit: Payer: Self-pay

## 2019-11-10 DIAGNOSIS — M898X7 Other specified disorders of bone, ankle and foot: Secondary | ICD-10-CM

## 2019-11-10 DIAGNOSIS — L603 Nail dystrophy: Secondary | ICD-10-CM

## 2019-11-10 DIAGNOSIS — M778 Other enthesopathies, not elsewhere classified: Secondary | ICD-10-CM

## 2019-11-10 MED ORDER — TERBINAFINE HCL 250 MG PO TABS: 250.0000 mg | ORAL_TABLET | Freq: Every day | ORAL | 0 refills | Status: DC

## 2019-11-11 NOTE — Progress Notes (Signed)
He presents today for follow-up of his plantar fasciitis and his hallux nail left with treatment of Lamisil.  States that he is out of the Lamisil has been doing very well with the plantar fasciitis.  Objective: Vital signs are stable he is alert and oriented x3 pulses are palpable.  He has no pain on palpation to the heels bilaterally his hallux nail left has grown out about 75 to 80% has a thick and on it.  Assessment: Well-healing plantar fasciitis well-healing onychomycosis.  Plan: We will continue him on the Lamisil 1 tablet every other day.  I am also going to debride the distal aspect of the nail down for him today and I will follow-up with him in about 3 months

## 2019-11-12 ENCOUNTER — Encounter: Payer: Self-pay | Admitting: Podiatry

## 2019-11-12 ENCOUNTER — Other Ambulatory Visit: Payer: Self-pay | Admitting: Podiatry

## 2019-11-12 MED ORDER — TERBINAFINE HCL 250 MG PO TABS: 250.0000 mg | ORAL_TABLET | Freq: Every day | ORAL | 0 refills | Status: DC

## 2019-11-18 ENCOUNTER — Ambulatory Visit (INDEPENDENT_AMBULATORY_CARE_PROVIDER_SITE_OTHER): Payer: Medicare Other | Admitting: *Deleted

## 2019-11-18 DIAGNOSIS — I495 Sick sinus syndrome: Secondary | ICD-10-CM | POA: Diagnosis not present

## 2019-11-19 ENCOUNTER — Ambulatory Visit: Payer: Medicare Other | Admitting: Psychology

## 2019-11-19 LAB — CUP PACEART REMOTE DEVICE CHECK
Date Time Interrogation Session: 20210603073705
Implantable Lead Implant Date: 20171217
Implantable Lead Implant Date: 20171217
Implantable Lead Location: 753859
Implantable Lead Location: 753860
Implantable Lead Model: 377
Implantable Lead Model: 377
Implantable Lead Serial Number: 49336100
Implantable Lead Serial Number: 49685190
Implantable Pulse Generator Implant Date: 20171217
Pulse Gen Model: 394929
Pulse Gen Serial Number: 68775417

## 2019-11-20 DIAGNOSIS — G5600 Carpal tunnel syndrome, unspecified upper limb: Secondary | ICD-10-CM | POA: Diagnosis not present

## 2019-11-20 DIAGNOSIS — M47817 Spondylosis without myelopathy or radiculopathy, lumbosacral region: Secondary | ICD-10-CM | POA: Diagnosis not present

## 2019-11-20 DIAGNOSIS — G894 Chronic pain syndrome: Secondary | ICD-10-CM | POA: Diagnosis not present

## 2019-11-20 DIAGNOSIS — M4302 Spondylolysis, cervical region: Secondary | ICD-10-CM | POA: Diagnosis not present

## 2019-11-24 NOTE — Progress Notes (Signed)
Remote pacemaker transmission.   

## 2019-12-03 ENCOUNTER — Ambulatory Visit (INDEPENDENT_AMBULATORY_CARE_PROVIDER_SITE_OTHER): Payer: Medicare Other | Admitting: Psychology

## 2019-12-03 DIAGNOSIS — F411 Generalized anxiety disorder: Secondary | ICD-10-CM

## 2019-12-17 ENCOUNTER — Ambulatory Visit: Payer: Medicare Other | Admitting: Psychology

## 2019-12-25 DIAGNOSIS — Z79899 Other long term (current) drug therapy: Secondary | ICD-10-CM | POA: Diagnosis not present

## 2019-12-25 DIAGNOSIS — G5600 Carpal tunnel syndrome, unspecified upper limb: Secondary | ICD-10-CM | POA: Diagnosis not present

## 2019-12-25 DIAGNOSIS — M4302 Spondylolysis, cervical region: Secondary | ICD-10-CM | POA: Diagnosis not present

## 2019-12-25 DIAGNOSIS — Z79891 Long term (current) use of opiate analgesic: Secondary | ICD-10-CM | POA: Diagnosis not present

## 2019-12-25 DIAGNOSIS — G894 Chronic pain syndrome: Secondary | ICD-10-CM | POA: Diagnosis not present

## 2019-12-25 DIAGNOSIS — M47817 Spondylosis without myelopathy or radiculopathy, lumbosacral region: Secondary | ICD-10-CM | POA: Diagnosis not present

## 2019-12-29 ENCOUNTER — Encounter: Payer: Self-pay | Admitting: Neurology

## 2019-12-29 ENCOUNTER — Ambulatory Visit (INDEPENDENT_AMBULATORY_CARE_PROVIDER_SITE_OTHER): Payer: Medicare Other | Admitting: Neurology

## 2019-12-29 ENCOUNTER — Other Ambulatory Visit: Payer: Self-pay

## 2019-12-29 VITALS — BP 127/79 | HR 75 | Ht 74.0 in | Wt 202.4 lb

## 2019-12-29 DIAGNOSIS — R4189 Other symptoms and signs involving cognitive functions and awareness: Secondary | ICD-10-CM

## 2019-12-29 DIAGNOSIS — G250 Essential tremor: Secondary | ICD-10-CM

## 2019-12-29 NOTE — Progress Notes (Signed)
NEUROLOGY FOLLOW UP OFFICE NOTE  Mark Strong [id number] 1970-08-14  HISTORY OF PRESENT ILLNESS: I had the pleasure of seeing Mark Strong in follow-up in the neurology clinic on 12/29/2019.  The patient was last seen over a year ago for hearing changes and near syncopal episodes. He has had extensive testing over the years with normal brain MRI, TCD, BAER. He had a 72-hour EEG in June 2020 which was again normal. He was also reporting memory loss and had Neuropsychological testing done in March 2021 which showed evidence of mild dysfunction primarily involving executive control. Etiology felt most likely due to modifiable risk factors and contributors such as chronic pain, multiple medical conditions, anxiety/affective issues, and medication effect. ADHD may also be at play but this was not part of the Neuropsychological evaluation. Findings had been discussed with him, he started Cymbalta a month ago and has been seeing a psychologist twice a month. He still has a lot of anxiety and catches himself doing strange things. This morning he realized he forgot his medication. He would catch himself putting things in the fridge that was meant for the cabinet. He reports several pain issues involving his back, neck, feet, knees, and wrists. He sees Pain Management. He also notes psychosocial stressors involving losing his job, splitting up with his fiancee, and his parents health. He had contacted our office for evaluation of tremors. He has noticed occasional tremors in both hands. Tremors do not affect eating or writing. He notices tremors more when anxiety kicks in or when he gets a little upset. It does not affect daily activities. His maternal grandmother and mother had tremors. He denies any headaches, dizziness, no significant falls.    History on Initial Assessment 11/06/2018:  This is a 49 year old right-handed man with a history of sick sinus syndrome s/p pacemaker, hyperlipidemia,  anxiety, presenting for evaluation of hearing changes and near syncopal episodes. He reports unusual hearing changes that started around 3 years ago. He would be having a conversation and can hear the other person fine, but when he starts talking, his voice would suddenly sound muffled in both ears. He is able to hear his voice but it would sound muffled, then the person who is talking to would sound fine. It occurs around once a week lasting 5-15 minutes. He states it drives him crazy, he would try popping his ears. There is no clear trigger. He denies any associated headache, dizziness, tinnitus, hearing loss, focal numbness/tingling/weakness. He denies any prior intake of offending medications prior to onset of symptoms (antibiotics, loop diuretics, significant use of aspirin/ibuprofen). He had seen an ENT in [facility] and locally and has been told his hearing his fine. He had also seen a neurologist in [place], records reviewed, he had a normal MRI brain with and without contrast with attention to the sella turcica in 01/2016. He had a normal auditory brainstem response test in 07/2016. Sleep study in 07/2016 showed moderate OSA. Neurology notes also indicate normal TCD, carotid dopplers, NTX (decreased working memory), and EEG. He recalls having a 24-hour EEG 3-5 years ago. He has been also diagnosed with memory loss and had memory testing in [place]. He saw ENT Dr. Ezzard Standing recently and was told it is an "issue between his brain and ears." For the past year, he has had near daily episodes where he feels like he will pass out. He used to have them prior to his pacemaker placement, but these quieted down until a year ago. They always  occur when he is standing or walking, never when sitting or supine. He would suddenly feel like his vision goes dark/tunnel vision and like he will pass out. This lasts 5-10 minutes where he has to hold on. There is no associated chest pain, cold/clammy sensation, diaphoresis, palpitations. Both legs  feel a little weak. He has not completely lost consciousness. They do not occur concurrently with the muffled hearing episodes. He has been seeing cardiologist Dr. Royann Shivers and amlodipine was discontinued. He reports BP is better, but he continues to have the near syncopal episodes. He denies any staring/unresponsive episodes, gaps in time. He was told that as a child he would have unresponsive episodes, but none in adulthood. He was previously on gabapentin for restless legs and back pain, his legs have not bothered him much since varicose vein treatment, he stopped gabapentin a few months ago when he ran out. He has been on Effexor 50mg  BID for the past year.   PAST MEDICAL HISTORY: Past Medical History:  Diagnosis Date  . Memory loss   . Pacemaker   . PVNS (pigmented villonodular synovitis)   . Sciatica   . Sleep apnea   . Thyroid disease   . Varicose veins of right leg with edema     MEDICATIONS: Current Outpatient Medications on File Prior to Visit  Medication Sig Dispense Refill  . atorvastatin (LIPITOR) 10 MG tablet Take 1 tablet (10 mg total) by mouth daily at 6 PM. 90 tablet 3  . Blood Pressure Monitoring (BLOOD PRESSURE CUFF) MISC 1 Device by Does not apply route once for 1 dose. 1 each 0  . cyclobenzaprine (FLEXERIL) 10 MG tablet Take 10 mg by mouth 3 (three) times daily as needed for muscle spasms.    Sodium (COLACE PO) Take by mouth.    . DULoxetine (CYMBALTA) 30 MG capsule Take 1 capsule (30 mg total) by mouth daily. 90 capsule 1  . HYDROcodone-acetaminophen (NORCO/VICODIN) 5-325 MG tablet Take 1 tablet by mouth 4 (four) times daily as needed for moderate pain.    Tery Sanfilippo levothyroxine (SYNTHROID) 75 MCG tablet TAKE 1 TABLET(75 MCG) BY MOUTH DAILY BEFORE BREAKFAST 90 tablet 3  . linaclotide (LINZESS) 145 MCG CAPS capsule Take 145 mcg by mouth daily before breakfast.    . MELATONIN PO Take 1 capsule by mouth at bedtime. 6 mg    . meloxicam (MOBIC) 15 MG tablet Take 1  tablet (15 mg total) by mouth daily. 30 tablet 1  . Multiple Vitamin (MULTIVITAMIN) capsule Take 1 capsule by mouth daily.    Marland Kitchen NASACORT ALLERGY 24HR 55 MCG/ACT AERO nasal inhaler USE 2-3 SPRAYS IN EACH NOSTRIL QD UTD    . tadalafil (CIALIS) 10 MG tablet Take 1 tablet (10 mg total) by mouth every other day as needed for erectile dysfunction. 30 tablet 1  . tamsulosin (FLOMAX) 0.4 MG CAPS capsule Take 1 capsule (0.4 mg total) by mouth daily. 90 capsule 3  . terbinafine (LAMISIL) 250 MG tablet Take 1 tablet (250 mg total) by mouth daily. 30 tablet 0  . zaleplon (SONATA) 5 MG capsule Take 1 capsule (5 mg total) by mouth at bedtime as needed for sleep. 30 capsule 0   No current facility-administered medications on file prior to visit.    ALLERGIES: Allergies  Allergen Reactions  . Betadine [Povidone Iodine] Rash    FAMILY HISTORY: Family History  Problem Relation Age of Onset  . Arthritis Mother   . Depression Mother   . Heart disease  Mother   . Arthritis Father   . COPD Father   . Diabetes Father   . Hyperlipidemia Father   . Hypertension Father   . Kidney disease Father   . Thyroid disease Father   . Cancer Brother     SOCIAL HISTORY: Social History   Socioeconomic History  . Marital status: Single    Spouse name: Not on file  . Number of children: 0  . Years of education: 70  . Highest education level: 12th grade  Occupational History  . Not on file  Tobacco Use  . Smoking status: Never Smoker  . Smokeless tobacco: Never Used  Vaping Use  . Vaping Use: Never used  Substance and Sexual Activity  . Alcohol use: Not Currently  . Drug use: Not Currently  . Sexual activity: Yes  Other Topics Concern  . Not on file  Social History Narrative   Right handed   Social Determinants of Health   Financial Resource Strain:   . Difficulty of Paying Living Expenses:   Food Insecurity:   . Worried About Programme researcher, broadcasting/film/video in the Last Year:   . Barista in the  Last Year:   Transportation Needs:   . Freight forwarder (Medical):   Marland Kitchen Lack of Transportation (Non-Medical):   Physical Activity:   . Days of Exercise per Week:   . Minutes of Exercise per Session:   Stress:   . Feeling of Stress :   Social Connections:   . Frequency of Communication with Friends and Family:   . Frequency of Social Gatherings with Friends and Family:   . Attends Religious Services:   . Active Member of Clubs or Organizations:   . Attends Banker Meetings:   Marland Kitchen Marital Status:   Intimate Partner Violence:   . Fear of Current or Ex-Partner:   . Emotionally Abused:   Marland Kitchen Physically Abused:   . Sexually Abused:      PHYSICAL EXAM: Vitals:   12/29/19 1351  BP: 127/79  Pulse: 75  SpO2: 99%   General: No acute distress Head:  Normocephalic/atraumatic Skin/Extremities: No rash, no edema Neurological Exam: alert and oriented to person, place, and time. No aphasia or dysarthria. Fund of knowledge is appropriate.  Recent and remote memory are intact.  Attention and concentration are normal.  Cranial nerves: Pupils equal, round, reactive to light. Extraocular movements intact with no nystagmus. Visual fields full.  No facial asymmetry.  Motor: Bulk and tone normal, no cogwheeling, muscle strength 5/5 throughout with no pronator drift. Finger to nose testing intact.  Gait slow and cautious, no ataxia. He has a cane but can ambulate without cane. He has occasional mild high frequency low amplitude postural hand tremors, no resting or endpoint tremor seen. No micrographia, good spiral drawing.    IMPRESSION: This is a 49 yo RH man with a history of sick sinus syndrome s/p pacemaker, hyperlipidemia, anxiety, who initially presented with hearing changes and near syncopal episodes. He has had extensive testing with his prior neurologist, including MRI brain, BAER, TCD. His 72-hour EEG normal. There is no clear neurological cause for his symptoms. 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 He now sees a psychologist and will discuss increasing Cymbalta with his PCP. 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 Since tremors are not debilitating, we have agreed to hold off on  starting Propranolol for now, continue to monitor. Follow-up in 6-7 months, he knows to call for any changes.   Thank you for allowing me to participate in his care.  Please do not hesitate to call for any questions or concerns.   Patrcia DollyKaren Pau Banh, M.D.   CC: Dr. Neva SeatGreene

## 2019-12-29 NOTE — Patient Instructions (Signed)
Good to see you in person! Continue to monitor tremors. Continue working with your other physicians, discuss increasing Cymbalta dose with PCP. Follow-up in 6 months, call for any changes.

## 2019-12-31 ENCOUNTER — Ambulatory Visit (INDEPENDENT_AMBULATORY_CARE_PROVIDER_SITE_OTHER): Payer: Medicare Other | Admitting: Psychology

## 2019-12-31 DIAGNOSIS — F411 Generalized anxiety disorder: Secondary | ICD-10-CM | POA: Diagnosis not present

## 2020-01-08 ENCOUNTER — Encounter: Payer: Self-pay | Admitting: Family Medicine

## 2020-01-08 ENCOUNTER — Other Ambulatory Visit: Payer: Self-pay

## 2020-01-08 ENCOUNTER — Ambulatory Visit (INDEPENDENT_AMBULATORY_CARE_PROVIDER_SITE_OTHER): Payer: Medicare Other | Admitting: Family Medicine

## 2020-01-08 VITALS — BP 104/71 | HR 64 | Temp 98.2°F | Ht 74.0 in | Wt 203.0 lb

## 2020-01-08 DIAGNOSIS — Z87438 Personal history of other diseases of male genital organs: Secondary | ICD-10-CM | POA: Diagnosis not present

## 2020-01-08 DIAGNOSIS — N401 Enlarged prostate with lower urinary tract symptoms: Secondary | ICD-10-CM

## 2020-01-08 DIAGNOSIS — F411 Generalized anxiety disorder: Secondary | ICD-10-CM

## 2020-01-08 DIAGNOSIS — R350 Frequency of micturition: Secondary | ICD-10-CM

## 2020-01-08 DIAGNOSIS — G894 Chronic pain syndrome: Secondary | ICD-10-CM

## 2020-01-08 MED ORDER — DULOXETINE HCL 60 MG PO CPEP: 60.0000 mg | ORAL_CAPSULE | Freq: Every day | ORAL | 1 refills | Status: DC

## 2020-01-08 NOTE — Progress Notes (Signed)
Subjective:  Patient ID: Mark Strong, male    DOB: Jun 02, 1971  Age: 49 y.o. MRN: [id number]  CC:  Chief Complaint  Patient presents with  . Follow-up    on hypothyroidism. and urology apt. PT reports he went to the urologist and they thought he might be having an infection the but him on bactrum sor a few days and the pt goes back to the urologist on on 01/12/2020 to follow-up. Pt reports no physical symptoms of his hypothyroidism. pt takes his medication as prescribed with no known side effects. Pt isn't fasting today.  . Anxiety    GAD-7 score of 8    HPI Mark Strong presents for   Hypothyroidism: Lab Results  Component Value Date   TSH 1.30 10/28/2019  Taking medication daily.  Synthroid 75 mcg daily. Followed by Dr. Everardo All.  No new hot or cold intolerance. No new hair or skin changes, heart palpitations or new fatigue. No new weight changes.   Anxiety Recent visit with neurology noted with benign familial tremor, memory concerns.  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  Followed by psychologist.  Currently takes Cymbalta, with history of chronic pain syndrome. Pain control stable.  Feels more anxious. Stress with dad's health issues and mom recently with health issues, and in hospital - she is home now.  Discussed with therapist. Possible change in cymbalta dose discussed. No side effects.  Has appt with urology next Tuesday for repeat PSA. Treated with 3 weeks bactrim for possible prostatitis for elevated PSA prior.  No new urinary symptoms,. Nocturia -1-2 only. No worsening. Prior pelvic pains have resolved with antibiotics.  Depression screen [facility] 2/9 01/08/2020 08/27/2019 07/30/2019 04/27/2019 03/02/2019  Decreased Interest 0 0 0 0 0  Down, Depressed, Hopeless 0 0 0 0 0  PHQ - 2 Score 0 0 0 0 0  Altered sleeping - - - - -  Tired, decreased energy - - - - -  Change in appetite - - - -  -  Feeling bad or failure about yourself  - - - - -  Trouble concentrating - - - - -  Moving slowly or fidgety/restless - - - - -  Suicidal thoughts - - - - -  PHQ-9 Score - - - - -  Difficult doing work/chores - - - - -      GAD 7 : Generalized Anxiety Score 01/08/2020 07/15/2018  Nervous, Anxious, on Edge 2 0  Control/stop worrying 2 1  Worry too much - different things 1 1  Trouble relaxing 1 2  Restless 0 0  Easily annoyed or irritable 2 2  Afraid - awful might happen 0 0  Total GAD 7 Score 8 6  Anxiety Difficulty - Not difficult at all          History Patient Active Problem List   Diagnosis Date Noted  . 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 Near syncope 12/18/2018  . Atypical atrial flutter (HCC) 12/18/2018  . Hyperprolactinemia (HCC) 10/28/2018  . Hypercholesterolemia 10/15/2018  . SSS (sick sinus syndrome) (HCC) 08/20/2018  . Pacemaker 05/28/2018  . OSA (obstructive sleep apnea) 05/28/2018  . 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 Hypothyroidism 04/28/2018  . Right thyroid nodule 04/28/2018  . Pain of left hip joint 04/17/2018  . 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 Chronic back pain 03/14/2018  . Bradycardia 03/14/2018   Past  Medical History:  Diagnosis Date  . Memory loss   . Pacemaker   . PVNS (pigmented villonodular synovitis)   . Sciatica   . Sleep apnea   . Thyroid disease   . Varicose veins of right leg with edema    Past Surgical History:  Procedure Laterality Date  . CARDIAC CATHETERIZATION     left and right   . INSERT / REPLACE / REMOVE PACEMAKER     Biotronik, dual-chamber, 05/18/2016 for SSS  . Right greater saphenous vein ablation Right    Allergies  Allergen Reactions  . Betadine [Povidone Iodine] Rash   Prior to Admission medications   Medication Sig Start Date End Date Taking? Authorizing Provider  atorvastatin (LIPITOR) 10 MG tablet Take 1 tablet  (10 mg total) by mouth daily at 6 PM. 08/04/19  Yes Shade FloodGreene, Aizen Duval R, MD  cyclobenzaprine (FLEXERIL) 10 MG tablet Take 10 mg by mouth 3 (three) times daily as needed for muscle spasms.   Yes [provider]  Docusate Sodium (COLACE PO) Take by mouth.   Yes [provider]  DULoxetine (CYMBALTA) 30 MG capsule Take 1 capsule (30 mg total) by mouth daily. 08/06/19  Yes Shade FloodGreene, Raekwan Spelman R, MD  HYDROcodone-acetaminophen (NORCO/VICODIN) 5-325 MG tablet Take 1 tablet by mouth 4 (four) times daily as needed for moderate pain.   Yes [provider]  levothyroxine (SYNTHROID) 75 MCG tablet TAKE 1 TABLET(75 MCG) BY MOUTH DAILY BEFORE BREAKFAST 05/27/19  Yes Romero BellingEllison, Sean, MD  MELATONIN PO Take 1 capsule by mouth at bedtime. 6 mg   Yes [provider]  meloxicam (MOBIC) 15 MG tablet Take 1 tablet (15 mg total) by mouth daily. 04/27/19  Yes Shade FloodGreene, Hether Anselmo R, MD  Multiple Vitamin (MULTIVITAMIN) capsule Take 1 capsule by mouth daily.   Yes [provider]  NASACORT ALLERGY 24HR 55 MCG/ACT AERO nasal inhaler USE 2-3 SPRAYS IN EACH NOSTRIL QD UTD 01/29/19  Yes [provider]  tadalafil (CIALIS) 10 MG tablet Take 1 tablet (10 mg total) by mouth every other day as needed for erectile dysfunction. 04/14/18  Yes Shade FloodGreene, Devone Bonilla R, MD  tamsulosin (FLOMAX) 0.4 MG CAPS capsule Take 1 capsule (0.4 mg total) by mouth daily. 08/04/19  Yes Shade FloodGreene, Jamielee Mchale R, MD  terbinafine (LAMISIL) 250 MG tablet Take 1 tablet (250 mg total) by mouth daily. 11/12/19  Yes Hyatt, Max T, DPM  zaleplon (SONATA) 5 MG capsule Take 1 capsule (5 mg total) by mouth at bedtime as needed for sleep. 04/17/19  Yes Olalere, Adewale A, MD  Blood Pressure Monitoring (BLOOD PRESSURE CUFF) MISC 1 Device by Does not apply route once for 1 dose. 10/15/18 12/29/19  [name], Rachelle HoraMihai, MD   Social History   Socioeconomic History  . Marital status: Single    Spouse name: Not on file  . Number of children: 0  . Years  of education: 4412  . Highest education level: 12th grade  Occupational History  . Not on file  Tobacco Use  . Smoking status: Never Smoker  . Smokeless tobacco: Never Used  Vaping Use  . Vaping Use: Never used  Substance and Sexual Activity  . Alcohol use: Not Currently  . Drug use: Not Currently  . Sexual activity: Yes  Other Topics Concern  . Not on file  Social History Narrative   Right handed   Social Determinants of Health   Financial Resource Strain:   . Difficulty of Paying Living Expenses:   Food Insecurity:   .  Worried About Programme researcher, broadcasting/film/video in the Last Year:   . Barista in the Last Year:   Transportation Needs:   . Freight forwarder (Medical):   Marland Kitchen Lack of Transportation (Non-Medical):   Physical Activity:   . Days of Exercise per Week:   . Minutes of Exercise per Session:   Stress:   . Feeling of Stress :   Social Connections:   . Frequency of Communication with Friends and Family:   . Frequency of Social Gatherings with Friends and Family:   . Attends Religious Services:   . Active Member of Clubs or Organizations:   . Attends Banker Meetings:   Marland Kitchen Marital Status:   Intimate Partner Violence:   . Fear of Current or Ex-Partner:   . Emotionally Abused:   Marland Kitchen Physically Abused:   . Sexually Abused:     Review of Systems Per HPI  Objective:   Vitals:   01/08/20 1045  BP: 104/71  Pulse: 64  Temp: 98.2 F (36.8 C)  TempSrc: Temporal  SpO2: 100%  Weight: (!) 203 lb (92.1 kg)  Height: 6\' 2"  (1.88 m)     Physical Exam Vitals reviewed.  Constitutional:      General: He is not in acute distress.    Appearance: He is well-developed.  HENT:     Head: Normocephalic and atraumatic.  Cardiovascular:     Rate and Rhythm: Normal rate.  Pulmonary:     Effort: Pulmonary effort is normal.  Neurological:     Mental Status: He is alert and oriented to person, place, and time.     Assessment & Plan:  Mark Strong  is a 49 y.o. male . Anxiety state - Plan: DULoxetine (CYMBALTA) 60 MG capsule  - increased anxiety, some situational component.   - trail of higher dose cymbalta at 30mg  BID, then start 60mg  qd if tolerated.    Chronic pain syndrome  - stable. Continue  Benign prostatic hyperplasia with urinary frequency  History of prostatitis  - improving, resolution of pelvic symptoms as well, continue follow-up with urology  Meds ordered this encounter  Medications  . DULoxetine (CYMBALTA) 60 MG capsule    Sig: Take 1 capsule (60 mg total) by mouth daily.    Dispense:  90 capsule    Refill:  1    Place on hold until needed.   Patient Instructions   Try higher dose of Cymbalta. Can take 30 mg twice per day for now. If that is tolerated then can take the 60 mg pill that is at your pharmacy. Follow-up with me in 6 weeks.   Glad to hear the pelvic symptoms/prostate symptoms have improved. Keep follow-up with urology as planned. Let me know if there are questions and take care   If you have lab work done today you will be contacted with your lab results within the next 2 weeks.  If you have not heard from 54 then please contact . The fastest way to get your results is to register for My Chart.   IF you received an x-ray today, you will receive an invoice from [facility]. Please contact [facility] at [phone] with questions or concerns regarding your invoice.   IF you received labwork today, you will receive an invoice from [place]. Please contact [facility] at [phone] with questions or concerns regarding your invoice.   Our billing staff will not be able to assist you with questions regarding bills from these companies.  You will be contacted with the lab results as soon as they are available. The fastest way to get your results is to activate your My Chart account. Instructions are located on the last page of this paperwork. If you have not heard from [place] regarding the  results in 2 weeks, please contact this office.         Signed, Meredith Staggers, MD [facility]

## 2020-01-08 NOTE — Patient Instructions (Addendum)
Try higher dose of Cymbalta. Can take 30 mg twice per day for now. If that is tolerated then can take the 60 mg pill that is at your pharmacy. Follow-up with me in 6 weeks.   Glad to hear the pelvic symptoms/prostate symptoms have improved. Keep follow-up with urology as planned. Let me know if there are questions and take care   If you have lab work done today you will be contacted with your lab results within the next 2 weeks.  If you have not heard from [place] then please contact us. The fastest way to get your results is to register for My Chart.   IF you received an x-ray today, you will receive an invoice from [facility]. Please contact [facility] at [phone] with questions or concerns regarding your invoice.   IF you received labwork today, you will receive an invoice from [place]. Please contact [facility] at [phone] with questions or concerns regarding your invoice.   Our billing staff will not be able to assist you with questions regarding bills from these companies.  You will be contacted with the lab results as soon as they are available. The fastest way to get your results is to activate your My Chart account. Instructions are located on the last page of this paperwork. If you have not heard from [place] regarding the results in 2 weeks, please contact this office.

## 2020-01-12 ENCOUNTER — Encounter: Payer: Self-pay | Admitting: *Deleted

## 2020-01-14 ENCOUNTER — Ambulatory Visit (INDEPENDENT_AMBULATORY_CARE_PROVIDER_SITE_OTHER): Payer: Medicare Other | Admitting: Psychology

## 2020-01-14 DIAGNOSIS — F331 Major depressive disorder, recurrent, moderate: Secondary | ICD-10-CM | POA: Diagnosis not present

## 2020-01-18 ENCOUNTER — Other Ambulatory Visit: Payer: Self-pay

## 2020-01-18 ENCOUNTER — Encounter: Payer: Self-pay | Admitting: Cardiovascular Disease

## 2020-01-18 ENCOUNTER — Ambulatory Visit (INDEPENDENT_AMBULATORY_CARE_PROVIDER_SITE_OTHER): Payer: Medicare Other | Admitting: Cardiovascular Disease

## 2020-01-18 VITALS — BP 111/73 | HR 64 | Ht 74.0 in | Wt 210.8 lb

## 2020-01-18 DIAGNOSIS — I480 Paroxysmal atrial fibrillation: Secondary | ICD-10-CM | POA: Diagnosis not present

## 2020-01-18 DIAGNOSIS — E7800 Pure hypercholesterolemia, unspecified: Secondary | ICD-10-CM | POA: Diagnosis not present

## 2020-01-18 DIAGNOSIS — I495 Sick sinus syndrome: Secondary | ICD-10-CM

## 2020-01-18 DIAGNOSIS — I4891 Unspecified atrial fibrillation: Secondary | ICD-10-CM

## 2020-01-18 DIAGNOSIS — Z9989 Dependence on other enabling machines and devices: Secondary | ICD-10-CM

## 2020-01-18 DIAGNOSIS — G4733 Obstructive sleep apnea (adult) (pediatric): Secondary | ICD-10-CM | POA: Diagnosis not present

## 2020-01-18 DIAGNOSIS — Z950 Presence of cardiac pacemaker: Secondary | ICD-10-CM | POA: Diagnosis not present

## 2020-01-18 NOTE — Patient Instructions (Signed)
Medication Instructions:  ?No changes ?*If you need a refill on your cardiac medications before your next appointment, please call your pharmacy* ? ? ?Lab Work: ?None ordered ?If you have labs (blood work) drawn today and your tests are completely normal, you will receive your results only by: ?MyChart Message (if you have MyChart) OR ?A paper copy in the mail ?If you have any lab test that is abnormal or we need to change your treatment, we will call you to review the results. ? ? ?Testing/Procedures: ?Your physician has requested that you have an echocardiogram. Echocardiography is a painless test that uses sound waves to create images of your heart. It provides your doctor with information about the size and shape of your heart and how well your heart?s chambers and valves are working. You may receive an ultrasound enhancing agent through an IV if needed to better visualize your heart during the echo.This procedure takes approximately one hour. There are no restrictions for this procedure. This will take place at the [address], [address].  ? ? ? ?Follow-Up: ?At [facility], you and your health needs are our priority.  As part of our continuing mission to provide you with exceptional heart care, we have created designated Provider Care Teams.  These Care Teams include your primary Cardiologist (physician) and Advanced Practice Providers (APPs -  Physician Assistants and Nurse Practitioners) who all work together to provide you with the care you need, when you need it. ? ?We recommend signing up for the patient portal called "MyChart".  Sign up information is provided on this After Visit Summary.  MyChart is used to connect with patients for Virtual Visits (Telemedicine).  Patients are able to view lab/test results, encounter notes, upcoming appointments, etc.  Non-urgent messages can be sent to your provider as well.   ?To learn more about what you can do with MyChart, go to [email or web address].    ? ?Your next appointment:   ?12 month(s) ? ?The format for your next appointment:   ?In Person ? ?Provider:   ?[name] [name], MD ? ?

## 2020-01-18 NOTE — Progress Notes (Signed)
Cardiology Office Note:    Date:  01/19/2020   ID:  Mark Strong, DOB 06-21-1970, MRN [id number]  PCP:  Mark Flood, MD  Cardiologist:  No primary care provider on file.  Electrophysiologist:  None   Referring MD: Mark Flood, MD   Chief Complaint  Patient presents with  . Pacemaker Check    History of Present Illness:    Mark Strong is a 49 y.o. 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  He received a dual-chamber permanent pacemaker in 2017 in [place].    He feels well and does not have any cardiovascular complaints.  He continues to pay a lot more attention to his health eating a healthy diet and exercising vigorously at the gym.  Although he has gained back about 7 pounds since his last appointment, he still is about 15 pounds lighter than he was a couple of years ago.  He denies angina, dyspnea or palpitations either at rest or with activity.  He has not had dizziness or syncope and denies leg edema or claudication.  He denies daytime hypersomnolence and wakes up feeling refreshed.  His dual-chamber Biotronik E. Luna 8 pacemaker was implanted in 2017.  As before he has roughly 90% atrial pacing and never requires ventricular pacing.  The heart rate histogram distribution appears appropriate.  His device recorded a 5-minute episode of paroxysmal atrial fibrillation in May.  This was asymptomatic even though it occurred during daytime hours.  The ventricular rate was well controlled.  On atorvastatin.  Uses Cialis for ED.  Compliant with CPAP for sleep apnea.  Has chronic pain and receives chronic buprenorphine.  Supplements for hypothyroidism and allopurinol for hyperuricemia.  Prior to pacemaker implantation he had normal coronary angiography in 2016 and EF 55%.  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  He has a history of endovenous laser ablation of the right greater saphenous vein.  Mark Strong is planning to relocate in the vicinity of [place], [place], where his parents live.  They are getting older and need more help.  Past Medical History:  Diagnosis Date  . Memory loss   . Pacemaker   . PVNS (pigmented villonodular synovitis)   . Sciatica   . Sleep apnea   . Thyroid disease   . Varicose veins of right leg with edema     Past Surgical History:  Procedure Laterality Date  . CARDIAC CATHETERIZATION     left and right   . INSERT / REPLACE / REMOVE PACEMAKER     Biotronik, dual-chamber, 05/18/2016 for SSS  . Right greater saphenous vein ablation Right     Current Medications: Current Meds  Medication Sig  . atorvastatin (LIPITOR) 10 MG tablet Take 1 tablet (10 mg total) by mouth daily at 6 PM.  . Blood Pressure Monitoring (BLOOD PRESSURE CUFF) MISC 1 Device by Does not apply route once for 1 dose.  . cyclobenzaprine (FLEXERIL) 10 MG tablet Take 10 mg by mouth 3 (three) times daily as needed for muscle spasms.  Tery Sanfilippo Sodium (COLACE PO) Take by mouth.  . DULoxetine (CYMBALTA) 60 MG capsule Take 1 capsule (60 mg total) by mouth daily.  Marland Kitchen HYDROcodone-acetaminophen (NORCO/VICODIN) 5-325 MG tablet Take 1 tablet by mouth 4 (four) times daily as needed for moderate pain.  Marland Kitchen levothyroxine (SYNTHROID) 75 MCG tablet TAKE 1 TABLET(75 MCG) BY  MOUTH DAILY BEFORE BREAKFAST  . MELATONIN PO Take 1 capsule by mouth at bedtime. 6 mg  . meloxicam (MOBIC) 15 MG tablet Take 1 tablet (15 mg total) by mouth daily.  . Multiple Vitamin (MULTIVITAMIN) capsule Take 1 capsule by mouth daily.  Marland Kitchen. NASACORT ALLERGY 24HR 55 MCG/ACT AERO nasal inhaler USE 2-3 SPRAYS IN EACH NOSTRIL QD UTD  . tadalafil (CIALIS) 10 MG tablet Take 1 tablet (10 mg total) by mouth every other day as needed for erectile dysfunction.  . tamsulosin (FLOMAX) 0.4 MG CAPS capsule Take 1 capsule (0.4 mg total) by mouth daily.   Marland Kitchen. terbinafine (LAMISIL) 250 MG tablet Take 1 tablet (250 mg total) by mouth daily.  . zaleplon (SONATA) 5 MG capsule Take 1 capsule (5 mg total) by mouth at bedtime as needed for sleep.     Allergies:   Betadine [povidone iodine]   Social History   Socioeconomic History  . Marital status: Single    Spouse name: Not on file  . Number of children: 0  . Years of education: 1412  . Highest education level: 12th grade  Occupational History  . Not on file  Tobacco Use  . Smoking status: Current Every Day Smoker    Types: Cigarettes  . Smokeless tobacco: Never Used  Vaping Use  . Vaping Use: Never used  Substance and Sexual Activity  . Alcohol use: Not Currently  . Drug use: Not Currently  . Sexual activity: Yes  Other Topics Concern  . Not on file  Social History Narrative   Right handed   Social Determinants of Health   Financial Resource Strain:   . Difficulty of Paying Living Expenses:   Food Insecurity:   . Worried About Programme researcher, broadcasting/film/videounning Out of Food in the Last Year:   . Baristaan Out of Food in the Last Year:   Transportation Needs:   . Freight forwarderLack of Transportation (Medical):   Marland Kitchen. Lack of Transportation (Non-Medical):   Physical Activity:   . Days of Exercise per Week:   . Minutes of Exercise per Session:   Stress:   . Feeling of Stress :   Social Connections:   . Frequency of Communication with Friends and Family:   . Frequency of Social Gatherings with Friends and Family:   . Attends Religious Services:   . Active Member of Clubs or Organizations:   . Attends BankerClub or Organization Meetings:   Marland Kitchen. Marital Status:      Family History: The patient's family history includes Arthritis in his father and mother; COPD in his father; Cancer in his brother; Depression in his mother; Diabetes in his father; Heart disease in his mother; Hyperlipidemia in his father; Hypertension in his father; Kidney disease in his father; Thyroid disease in his father.  ROS:   Please see the history of  present illness.     All other systems reviewed and are negative.  EKGs/Labs/Other Studies Reviewed:    The following studies were reviewed today: Comprehensive pacemaker check.  EKG:  EKG is ordered today.  It shows atrial paced, ventricular sensed rhythm.  Recent Labs: 03/02/2019: ALT 13; BUN 12; Creatinine, Ser 1.04; Potassium 4.5; Sodium 140 10/28/2019: TSH 1.30  Recent Lipid Panel    Component Value Date/Time   CHOL 153 03/02/2019 1126   TRIG 71 03/02/2019 1126   HDL 68 03/02/2019 1126   CHOLHDL 2.3 03/02/2019 1126   LDLCALC 71 03/02/2019 1126    Physical Exam:    VS:  BP 111/73  Pulse 64   Ht 6\' 2"  (1.88 m)   Wt (!) 210 lb 12.8 oz (95.6 kg)   SpO2 100%   BMI 27.07 kg/m     Wt Readings from Last 3 Encounters:  01/18/20 (!) 210 lb 12.8 oz (95.6 kg)  01/08/20 (!) 203 lb (92.1 kg)  12/29/19 202 lb 6.4 oz (91.8 kg)      General: Alert, oriented x3, no distress, appears fit.  Healthy left subclavian pacemaker site. Head: no evidence of trauma, PERRL, EOMI, no exophtalmos or lid lag, no myxedema, no xanthelasma; normal ears, nose and oropharynx Neck: normal jugular venous pulsations and no hepatojugular reflux; brisk carotid pulses without delay and no carotid bruits Chest: clear to auscultation, no signs of consolidation by percussion or palpation, normal fremitus, symmetrical and full respiratory excursions Cardiovascular: normal position and quality of the apical impulse, regular rhythm, normal first and second heart sounds, no murmurs, rubs or gallops Abdomen: no tenderness or distention, no masses by palpation, no abnormal pulsatility or arterial bruits, normal bowel sounds, no hepatosplenomegaly Extremities: no clubbing, cyanosis or edema; 2+ radial, ulnar and brachial pulses bilaterally; 2+ right femoral, posterior tibial and dorsalis pedis pulses; 2+ left femoral, posterior tibial and dorsalis pedis pulses; no subclavian or femoral bruits Neurological: grossly  nonfocal Psych: Normal mood and affect   ASSESSMENT:    1. Paroxysmal atrial fibrillation (HCC)   2. SSS (sick sinus syndrome) (HCC)   3. Pacemaker   4. OSA on CPAP   5. Hypercholesterolemia    PLAN:    In order of problems listed above:  1. AFib: This is a new abnormality.  Previous device downloads only showed brief episodes of nonsustained ectopic atrial tachycardia.  The episode was asymptomatic, was not associated with significant rapid ventricular rates.  CHA2DS2-VASc score is 0.  Anticoagulation is not indicated.  Discussed the implications of increased risk of stroke/systemic embolism and told him that he should promptly report any focal neurological events, even if transient.  I did not recommend aspirin since his benefits are questionable and his risk of stroke is low.  Both his parents had atrial fibrillation.  The arrhythmia may be related to his underlying conduction system disease or to a genetic predisposition.  We will check an echocardiogram to make sure there is no evidence of underlying structural heart problems that could explain the occurrence of atrial fibrillation. 2. SSS: Good exercise tolerance, appropriate heart rate histogram distribution of the current sensor settings. 3. Pacemaker: Doing much better on CLS based rate response, no additional adjustments were necessary today.  Follow-up in the office yearly, continue with remote downloads every 3 months.  We can continue following his device remotely until he secures follow-up with a cardiologist at his new location (there are cardiology offices in nearby [place] or [place]). 4. OSA: Reports 100% compliance with CPAP and good benefit from treatment.  He denies any daytime hypersomnolence. 5. HLP: LDL at target.  On statin. 6. Reported history of coronary spasm: Asymptomatic off amlodipine.  Medication Adjustments/Labs and Tests Ordered: Current medicines are reviewed at length with the patient today.  Concerns  regarding medicines are outlined above.  Orders Placed This Encounter  Procedures  . EKG 12-Lead  . ECHOCARDIOGRAM COMPLETE   No orders of the defined types were placed in this encounter.   Patient Instructions  Medication Instructions:  No changes *If you need a refill on your cardiac medications before your next appointment, please call your pharmacy*   Lab Work: None  ordered If you have labs (blood work) drawn today and your tests are completely normal, you will receive your results only by: Marland Kitchen MyChart Message (if you have MyChart) OR . A paper copy in the mail If you have any lab test that is abnormal or we need to change your treatment, we will call you to review the results.   Testing/Procedures: Your physician has requested that you have an echocardiogram. Echocardiography is a painless test that uses sound waves to create images of your heart. It provides your doctor with information about the size and shape of your heart and how well your heart's chambers and valves are working. You may receive an ultrasound enhancing agent through an IV if needed to better visualize your heart during the echo.This procedure takes approximately one hour. There are no restrictions for this procedure. This will take place at the [address], [address].    Follow-Up: At [facility], you and your health needs are our priority.  As part of our continuing mission to provide you with exceptional heart care, we have created designated Provider Care Teams.  These Care Teams include your primary Cardiologist (physician) and Advanced Practice Providers (APPs -  Physician Assistants and Nurse Practitioners) who all work together to provide you with the care you need, when you need it.  We recommend signing up for the patient portal called "MyChart".  Sign up information is provided on this After Visit Summary.  MyChart is used to connect with patients for Virtual Visits (Telemedicine).  Patients  are able to view lab/test results, encounter notes, upcoming appointments, etc.  Non-urgent messages can be sent to your provider as well.   To learn more about what you can do with MyChart, go to [email or web address].    Your next appointment:   12 month(s)  The format for your next appointment:   In Person  Provider:   Thurmon Fair, MD       Signed, Thurmon Fair, MD  01/19/2020 6:25 PM    [facility]

## 2020-01-19 ENCOUNTER — Encounter: Payer: Self-pay | Admitting: Cardiovascular Disease

## 2020-01-20 ENCOUNTER — Other Ambulatory Visit: Payer: Self-pay

## 2020-01-20 ENCOUNTER — Ambulatory Visit (HOSPITAL_COMMUNITY): Payer: Medicare Other | Attending: Cardiovascular Disease

## 2020-01-20 DIAGNOSIS — I480 Paroxysmal atrial fibrillation: Secondary | ICD-10-CM | POA: Insufficient documentation

## 2020-01-20 LAB — ECHOCARDIOGRAM COMPLETE
Area-P 1/2: 3.12 cm2
S' Lateral: 2.9 cm

## 2020-01-25 DIAGNOSIS — M542 Cervicalgia: Secondary | ICD-10-CM | POA: Diagnosis not present

## 2020-01-25 DIAGNOSIS — M47817 Spondylosis without myelopathy or radiculopathy, lumbosacral region: Secondary | ICD-10-CM | POA: Diagnosis not present

## 2020-01-25 DIAGNOSIS — M4302 Spondylolysis, cervical region: Secondary | ICD-10-CM | POA: Diagnosis not present

## 2020-01-25 DIAGNOSIS — G894 Chronic pain syndrome: Secondary | ICD-10-CM | POA: Diagnosis not present

## 2020-01-28 ENCOUNTER — Ambulatory Visit: Payer: Medicare Other | Admitting: Psychology

## 2020-02-09 ENCOUNTER — Ambulatory Visit: Payer: Medicare Other | Admitting: Podiatry

## 2020-02-11 ENCOUNTER — Ambulatory Visit (INDEPENDENT_AMBULATORY_CARE_PROVIDER_SITE_OTHER): Payer: Medicare Other | Admitting: Psychology

## 2020-02-11 DIAGNOSIS — F411 Generalized anxiety disorder: Secondary | ICD-10-CM | POA: Diagnosis not present

## 2020-02-17 ENCOUNTER — Ambulatory Visit (INDEPENDENT_AMBULATORY_CARE_PROVIDER_SITE_OTHER): Payer: Medicare Other | Admitting: *Deleted

## 2020-02-17 DIAGNOSIS — I495 Sick sinus syndrome: Secondary | ICD-10-CM

## 2020-02-18 ENCOUNTER — Encounter: Payer: Self-pay | Admitting: Family Medicine

## 2020-02-18 LAB — CUP PACEART REMOTE DEVICE CHECK
Battery Remaining Percentage: 70 %
Brady Statistic RA Percent Paced: 87 %
Brady Statistic RV Percent Paced: 0 %
Date Time Interrogation Session: 20210901102315
Implantable Lead Implant Date: 20171217
Implantable Lead Implant Date: 20171217
Implantable Lead Location: 753859
Implantable Lead Location: 753860
Implantable Lead Model: 377
Implantable Lead Model: 377
Implantable Lead Serial Number: 49336100
Implantable Lead Serial Number: 49685190
Implantable Pulse Generator Implant Date: 20171217
Lead Channel Impedance Value: 468 Ohm
Lead Channel Impedance Value: 527 Ohm
Lead Channel Pacing Threshold Amplitude: 0.4 V
Lead Channel Pacing Threshold Amplitude: 0.9 V
Lead Channel Pacing Threshold Pulse Width: 0.4 ms
Lead Channel Pacing Threshold Pulse Width: 0.4 ms
Lead Channel Sensing Intrinsic Amplitude: 10.8 mV
Lead Channel Sensing Intrinsic Amplitude: 3.1 mV
Pulse Gen Model: 394929
Pulse Gen Serial Number: 68775417

## 2020-02-19 ENCOUNTER — Ambulatory Visit: Payer: Medicare Other | Admitting: Family Medicine

## 2020-02-19 NOTE — Progress Notes (Signed)
Remote pacemaker transmission.   

## 2020-02-23 ENCOUNTER — Ambulatory Visit: Payer: Medicare Other | Admitting: Podiatry

## 2020-02-23 ENCOUNTER — Ambulatory Visit: Payer: Medicare Other | Admitting: Psychology

## 2020-02-23 DIAGNOSIS — M47817 Spondylosis without myelopathy or radiculopathy, lumbosacral region: Secondary | ICD-10-CM | POA: Diagnosis not present

## 2020-02-23 DIAGNOSIS — G5600 Carpal tunnel syndrome, unspecified upper limb: Secondary | ICD-10-CM | POA: Diagnosis not present

## 2020-02-23 DIAGNOSIS — G894 Chronic pain syndrome: Secondary | ICD-10-CM | POA: Diagnosis not present

## 2020-02-23 DIAGNOSIS — M4302 Spondylolysis, cervical region: Secondary | ICD-10-CM | POA: Diagnosis not present

## 2020-02-25 ENCOUNTER — Ambulatory Visit: Payer: Medicare Other | Admitting: Psychology

## 2020-03-01 ENCOUNTER — Ambulatory Visit: Payer: Medicare Other | Admitting: Podiatry

## 2020-03-01 ENCOUNTER — Encounter: Payer: Self-pay | Admitting: Podiatry

## 2020-03-01 ENCOUNTER — Other Ambulatory Visit: Payer: Self-pay

## 2020-03-01 DIAGNOSIS — L603 Nail dystrophy: Secondary | ICD-10-CM

## 2020-03-01 MED ORDER — TERBINAFINE HCL 250 MG PO TABS: 250.0000 mg | ORAL_TABLET | Freq: Every day | ORAL | 1 refills | Status: DC

## 2020-03-02 ENCOUNTER — Ambulatory Visit: Payer: Medicare Other | Admitting: Family Medicine

## 2020-03-02 NOTE — Progress Notes (Signed)
He presents today states that he will be moving to [place] to be with his parents.  He states that his toenails are looking much better and he is having no problems taking the medicine whatsoever.  He states that he continues to take it every other day or every third day without complications.  Objective: Vital signs are stable alert oriented x3 pulses are palpable.  Toenails appear to be growing out very nicely.  They are nearly 100% grown out.  Assessment: Near completion of onychomycosis treatment.  Plan: I gave him another dose 30 tablets 1 every other day and I will follow-up with him on an as-needed basis.  She had questions or concerns he is welcome to notify us.

## 2020-03-10 ENCOUNTER — Ambulatory Visit: Payer: Medicare Other | Admitting: Psychology

## 2020-03-15 DIAGNOSIS — Z20822 Contact with and (suspected) exposure to covid-19: Secondary | ICD-10-CM | POA: Diagnosis not present

## 2020-03-24 ENCOUNTER — Ambulatory Visit: Payer: Medicare Other | Admitting: Psychology

## 2020-03-29 DIAGNOSIS — M4302 Spondylolysis, cervical region: Secondary | ICD-10-CM | POA: Diagnosis not present

## 2020-03-29 DIAGNOSIS — G5600 Carpal tunnel syndrome, unspecified upper limb: Secondary | ICD-10-CM | POA: Diagnosis not present

## 2020-03-29 DIAGNOSIS — Z79899 Other long term (current) drug therapy: Secondary | ICD-10-CM | POA: Diagnosis not present

## 2020-03-29 DIAGNOSIS — G894 Chronic pain syndrome: Secondary | ICD-10-CM | POA: Diagnosis not present

## 2020-03-29 DIAGNOSIS — M47817 Spondylosis without myelopathy or radiculopathy, lumbosacral region: Secondary | ICD-10-CM | POA: Diagnosis not present

## 2020-03-29 DIAGNOSIS — Z79891 Long term (current) use of opiate analgesic: Secondary | ICD-10-CM | POA: Diagnosis not present

## 2020-04-07 ENCOUNTER — Ambulatory Visit (INDEPENDENT_AMBULATORY_CARE_PROVIDER_SITE_OTHER): Payer: Medicare Other | Admitting: Psychology

## 2020-04-07 DIAGNOSIS — F411 Generalized anxiety disorder: Secondary | ICD-10-CM

## 2020-04-14 DIAGNOSIS — G5600 Carpal tunnel syndrome, unspecified upper limb: Secondary | ICD-10-CM | POA: Diagnosis not present

## 2020-04-14 DIAGNOSIS — I517 Cardiomegaly: Secondary | ICD-10-CM | POA: Diagnosis not present

## 2020-04-14 DIAGNOSIS — Z950 Presence of cardiac pacemaker: Secondary | ICD-10-CM | POA: Diagnosis not present

## 2020-04-14 DIAGNOSIS — I8393 Asymptomatic varicose veins of bilateral lower extremities: Secondary | ICD-10-CM | POA: Diagnosis not present

## 2020-04-14 DIAGNOSIS — G250 Essential tremor: Secondary | ICD-10-CM | POA: Diagnosis not present

## 2020-04-15 DIAGNOSIS — I480 Paroxysmal atrial fibrillation: Secondary | ICD-10-CM | POA: Diagnosis not present

## 2020-04-21 ENCOUNTER — Ambulatory Visit (INDEPENDENT_AMBULATORY_CARE_PROVIDER_SITE_OTHER): Payer: Medicare Other | Admitting: Psychology

## 2020-04-21 DIAGNOSIS — F411 Generalized anxiety disorder: Secondary | ICD-10-CM

## 2020-04-29 ENCOUNTER — Telehealth: Payer: Self-pay | Admitting: Emergency Medicine

## 2020-04-29 NOTE — Telephone Encounter (Signed)
Patient called back in and wants to be released to [facility] clinic. Patient was released and appointments canceled

## 2020-04-29 NOTE — Telephone Encounter (Signed)
LMOM . Calling to confirm that patient wants to be released to [facility] in [place].Biotronik ppm. Device clinic # provided.

## 2020-05-05 ENCOUNTER — Ambulatory Visit: Payer: Medicare Other | Admitting: Psychology

## 2020-05-19 ENCOUNTER — Ambulatory Visit (INDEPENDENT_AMBULATORY_CARE_PROVIDER_SITE_OTHER): Payer: Medicare Other | Admitting: Psychology

## 2020-05-19 DIAGNOSIS — F411 Generalized anxiety disorder: Secondary | ICD-10-CM

## 2020-06-02 ENCOUNTER — Ambulatory Visit (INDEPENDENT_AMBULATORY_CARE_PROVIDER_SITE_OTHER): Payer: Medicare Other | Admitting: Psychology

## 2020-06-02 DIAGNOSIS — F411 Generalized anxiety disorder: Secondary | ICD-10-CM | POA: Diagnosis not present

## 2020-06-07 IMAGING — DX DG CHEST 1V
1 series · 1 of 1 positions shown · non-contrast
Comparison: Chest CT 04/17/2019

CLINICAL DATA: 49-year-old male with pre MRI chest x-ray

EXAM:
CHEST  1 VIEW

[chest pa]
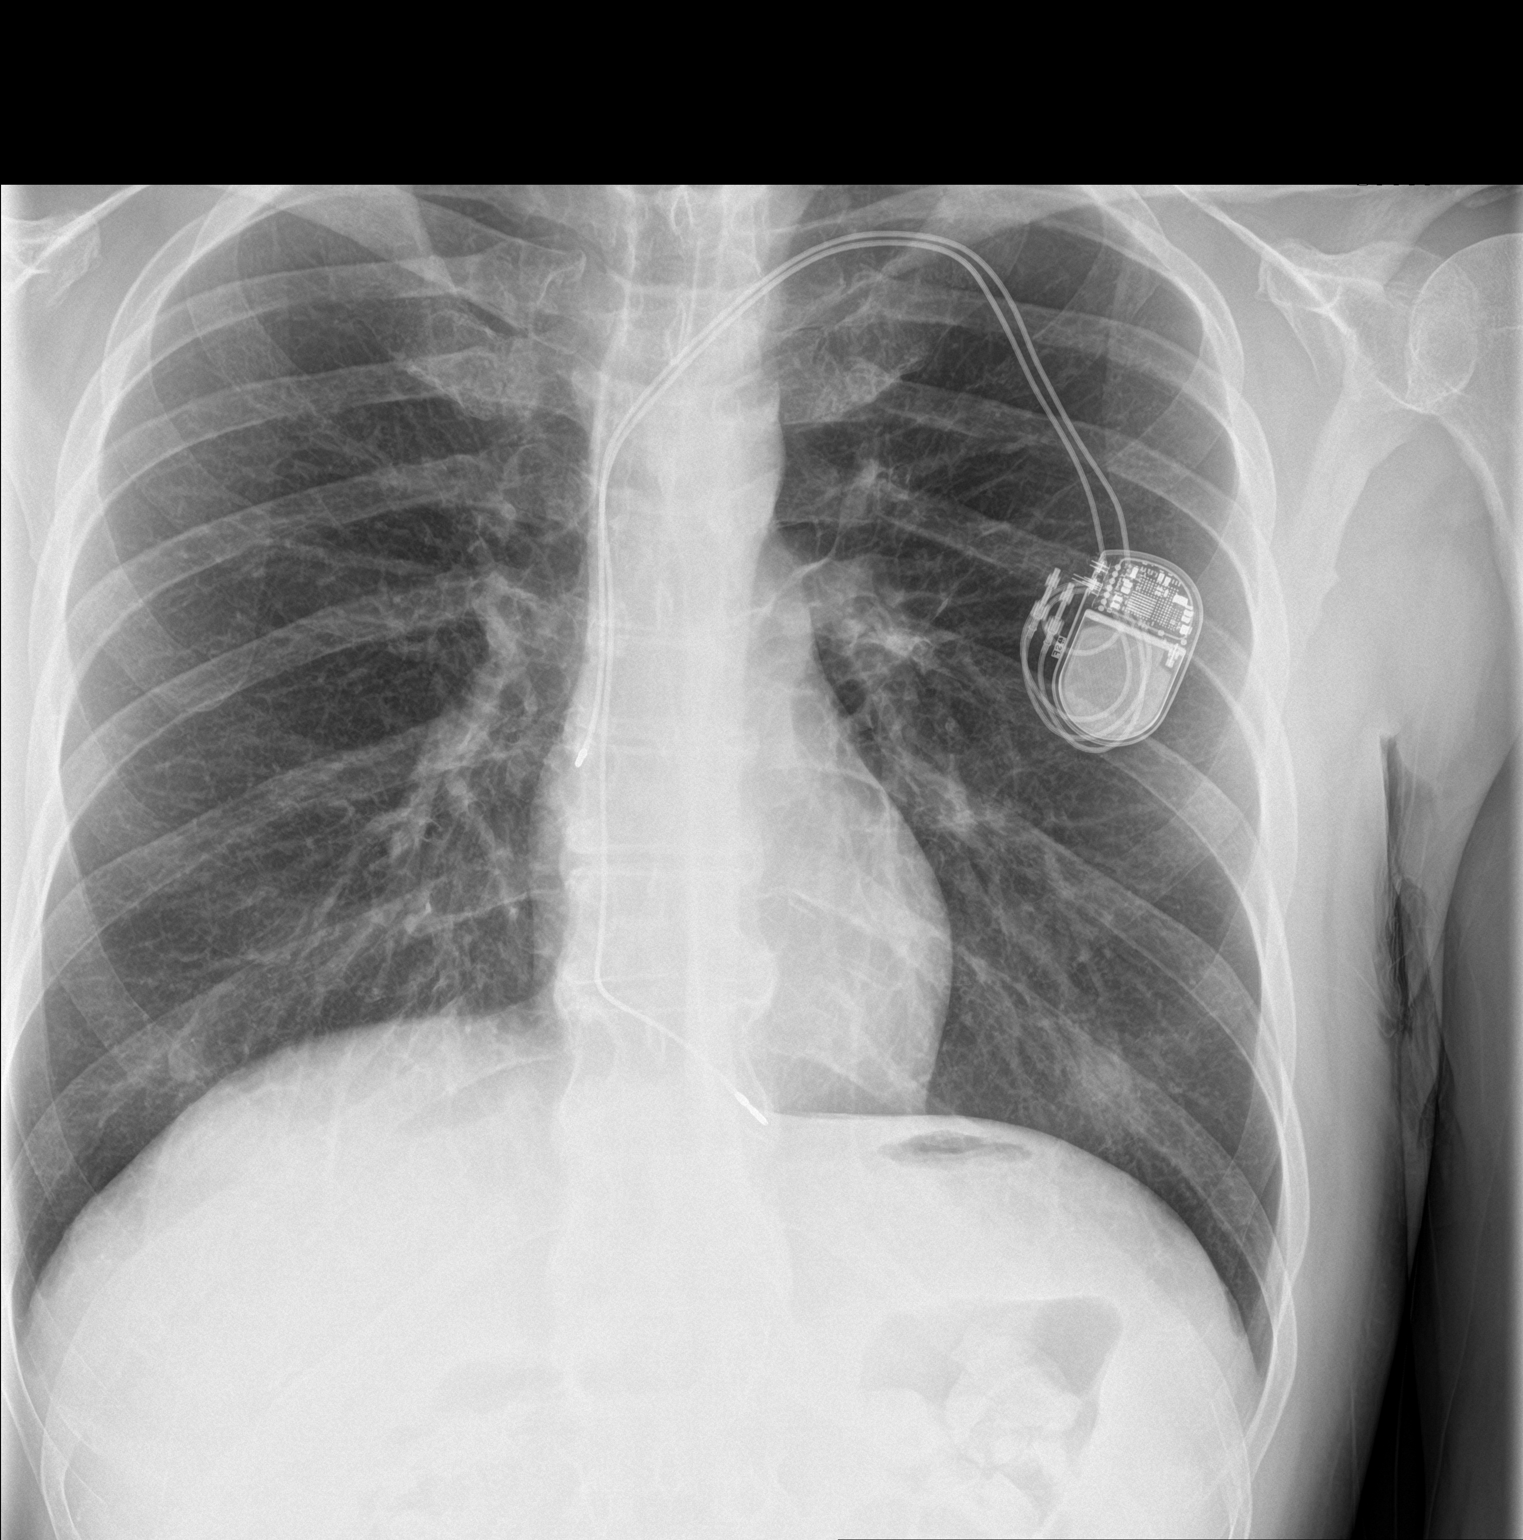

[1 of 1 positions shown; findings below may reference images not displayed]

FINDINGS: Cardiomediastinal silhouette within normal limits in size and
contour.

No pneumothorax. No pleural effusion. No confluent airspace disease.

No interlobular septal thickening.

Nipple shadows project over the lower bilateral chest.

Cardiac pacing device on the left chest wall with 2 leads via
subclavian approach.

No displaced fracture.
IMPRESSION: Negative for acute cardiopulmonary disease.

Left chest wall cardiac pacing device with 2 intact leads via
subclavian approach.

## 2020-06-07 IMAGING — MR MR FEMUR*L* WO/W CM
9 series · 40 of 40 positions shown · IV contrast (gadavist)
Comparison: Left hip x-ray 09/01/2019

CLINICAL DATA: Possible left femoral diaphyseal lesion identified
on radiography. History of bilateral hip pain. No known injury.

EXAM:
MR OF THE LEFT LOWER EXTREMITY WITHOUT AND WITH CONTRAST
TECHNIQUE: Multiplanar, multisequence MR imaging of the left femur was
performed both before and after administration of intravenous
contrast.
CONTRAST:  9mL GADAVIST GADOBUTROL 1 MMOL/ML IV SOLN

[Series 7: composed cor t1_comp_filt · coronal · left · 6.0mm · 1.11mm/px · 3 of 32 slices shown]
[im 1/32]
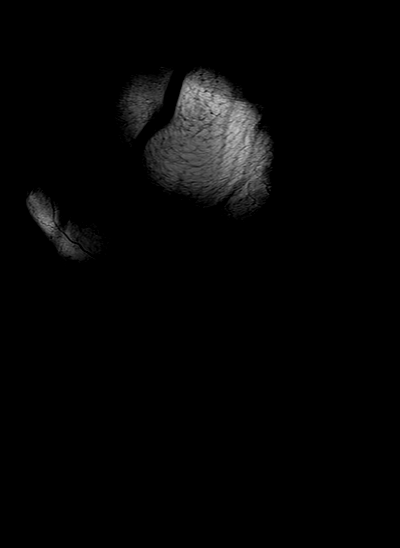
[im 16/32]
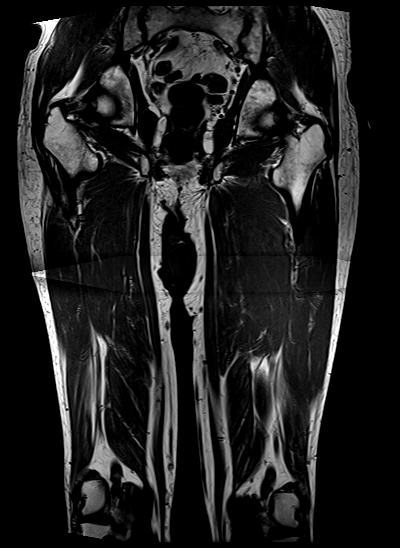
[im 32/32]
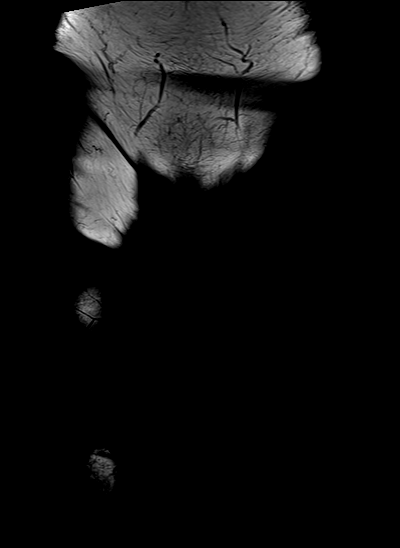

[Series 11: composed cor stir_comp_filt · coronal · left · 6.0mm · 1.25mm/px · 3 of 32 slices shown]
[im 1/32]
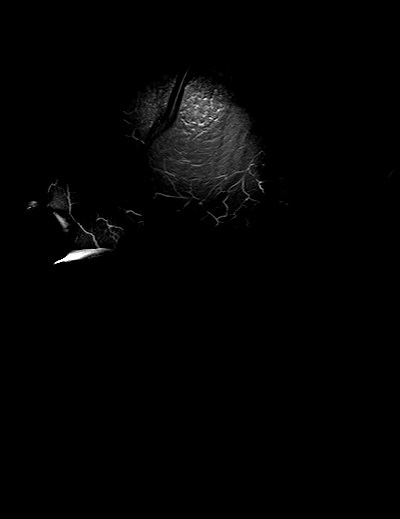
[im 16/32]
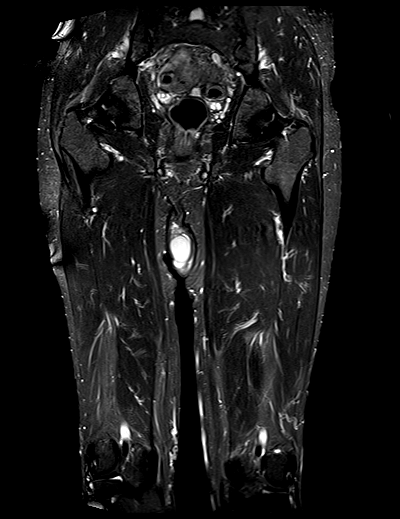
[im 32/32]
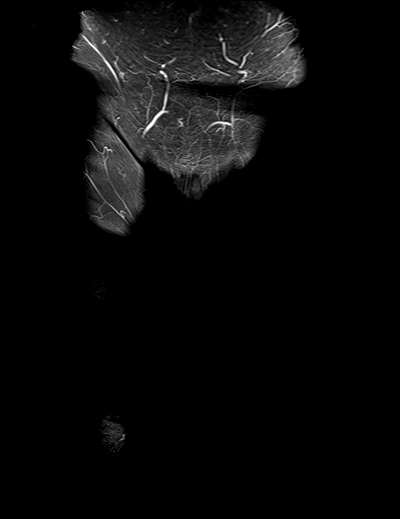

[Series 14: ax t1_comp · axial · left · 6.0mm · 0.78mm/px · z∈[-369,+174]mm · 7 of 76 slices shown]
[im 1/76]
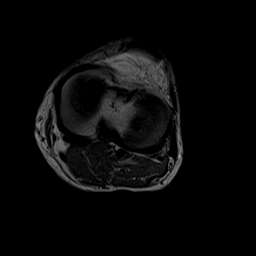
[im 13/76]
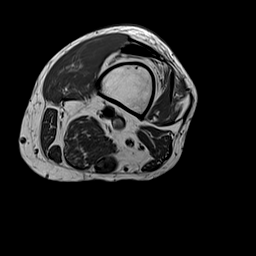
[im 26/76]
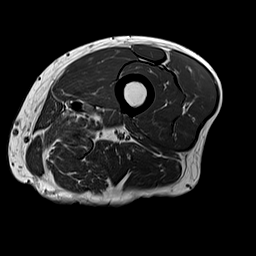
[im 38/76]
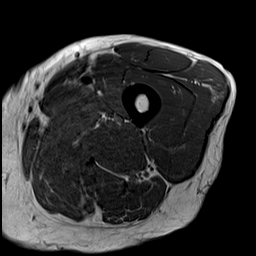
[im 51/76]
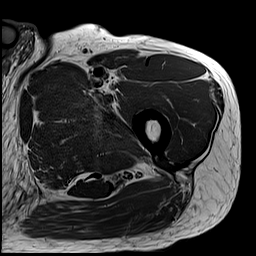
[im 63/76]
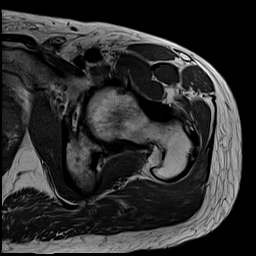
[im 76/76]
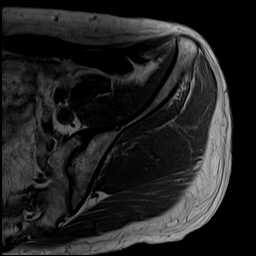

[Series 18: T2 · sagittal · left · 5.0mm · 1.15mm/px · 3 of 35 slices shown (1 of 3)]
[im 1/35]
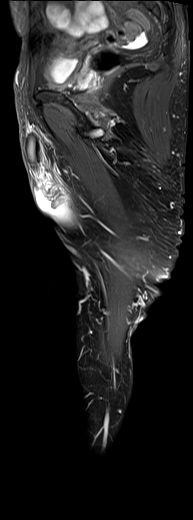
[im 18/35]
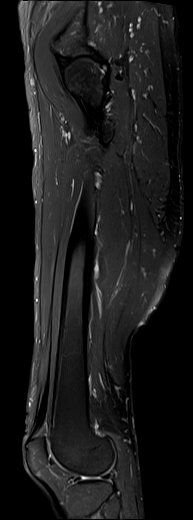
[im 35/35]
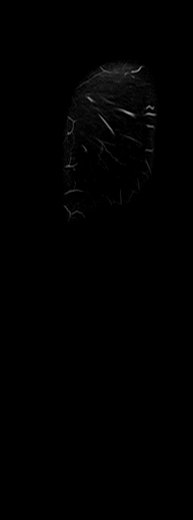

[Series 21: T2 · axial · left · 6.0mm · 1.11mm/px · z∈[-369,-110]mm · 3 of 37 slices shown (2 of 3)]
[im 1/37]
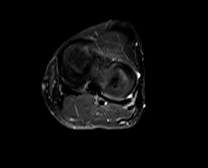
[im 19/37]
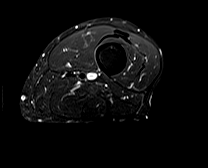
[im 37/37]
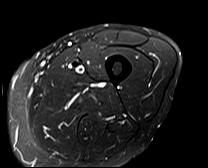

[Series 21: T2 · axial · left · 6.0mm · 1.11mm/px · z∈[-100,+173]mm · 4 of 39 slices shown (3 of 3)]
[im 1/39]
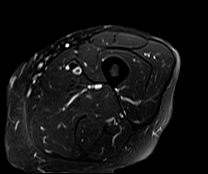
[im 13/39]
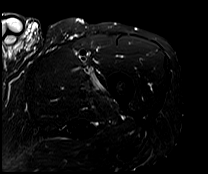
[im 26/39]
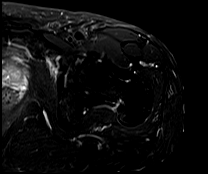
[im 39/39]
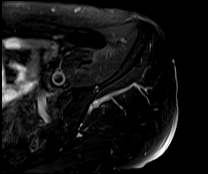

[Series 24: T1 fat-sat · axial · left · 6.0mm · 1.11mm/px · z∈[-369,+173]mm · 7 of 76 slices shown (1 of 3)]
[im 1/76]
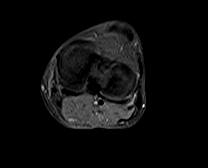
[im 13/76]
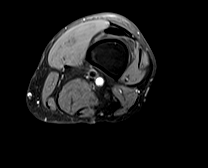
[im 26/76]
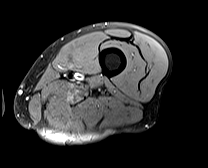
[im 38/76]
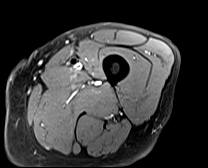
[im 51/76]
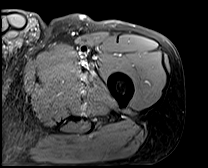
[im 63/76]
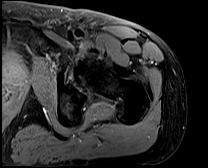
[im 76/76]
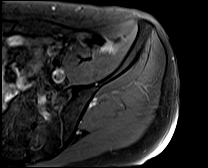

[Series 27: T1 fat-sat · axial · left · 6.0mm · 1.11mm/px · z∈[-368,+174]mm · 7 of 76 slices shown (2 of 3)]
[im 1/76]
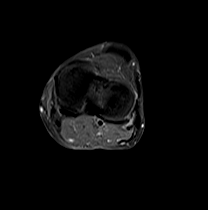
[im 13/76]
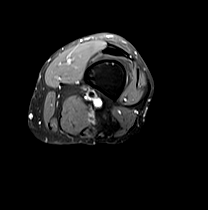
[im 26/76]
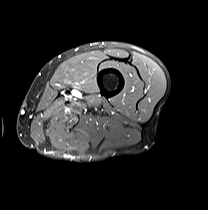
[im 38/76]
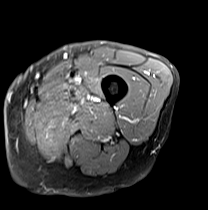
[im 51/76]
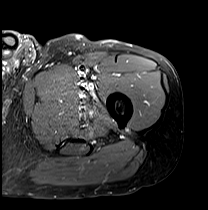
[im 63/76]
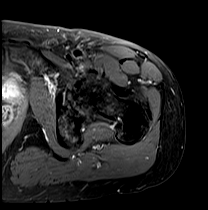
[im 76/76]
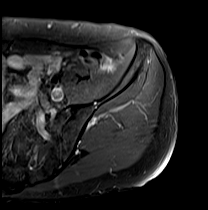

[Series 30: T1 fat-sat · sagittal · left · 5.0mm · 0.86mm/px · 3 of 36 slices shown (3 of 3)]
[im 1/36]
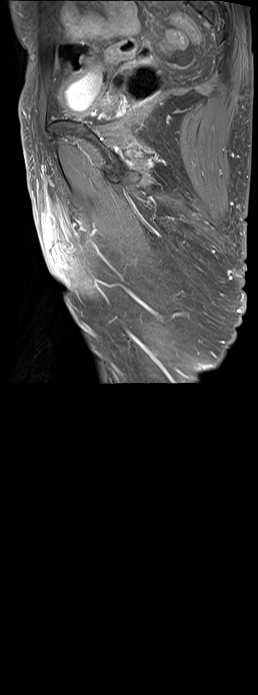
[im 18/36]
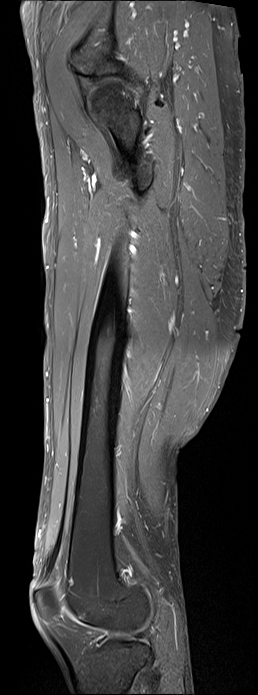
[im 36/36]
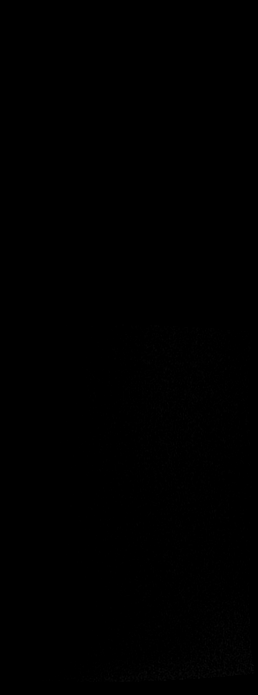

[40 of 40 positions shown; findings below may reference images not displayed]

FINDINGS: Bones/Joint/Cartilage

No acute fracture. No dislocation. No femoral head avascular
necrosis. No bone marrow edema. No suspicious bone lesion.
Specifically, no proximal left femoral diaphyseal lesion. Hip joint
space is relatively well maintained without a focal chondral defect.
Labrum appears grossly intact on large field-of-view images. No
paralabral cyst. No hip joint effusion. No evidence of internal
derangement on limited evaluation of the left knee. No left knee
joint effusion.

Ligaments

Intact.

Muscles and Tendons

Unremarkable muscle bulk and signal intensity without edema,
atrophy, or fatty infiltration. Tendinous structures of the hip and
knee appear intact without tendinosis or tear.

Soft tissues

No soft tissue fluid collection or hematoma. No left inguinal
lymphadenopathy. No abnormal enhancement.
IMPRESSION: Unremarkable pre and post-contrast MRI of the left femur. No
suspicious bone lesion.

## 2020-06-16 ENCOUNTER — Ambulatory Visit: Payer: Medicare Other | Admitting: Psychology

## 2020-06-30 ENCOUNTER — Ambulatory Visit: Payer: Medicare Other | Admitting: Psychology

## 2020-07-14 ENCOUNTER — Ambulatory Visit: Payer: Medicare Other | Admitting: Psychology

## 2020-07-26 ENCOUNTER — Ambulatory Visit: Payer: Medicare Other | Admitting: Neurology

## 2020-07-28 ENCOUNTER — Ambulatory Visit: Payer: Medicare HMO | Admitting: Psychology

## 2020-08-08 ENCOUNTER — Other Ambulatory Visit: Payer: Self-pay | Admitting: Family Medicine

## 2020-08-08 DIAGNOSIS — F411 Generalized anxiety disorder: Secondary | ICD-10-CM

## 2020-08-08 NOTE — Telephone Encounter (Signed)
Requested medication (s) are due for refill today: yes  Requested medication (s) are on the active medication list: yes  Last refill:  05/02/2020  Future visit scheduled: no  Notes to clinic:  overdue for follow up appt   Requested Prescriptions  Pending Prescriptions Disp Refills   DULoxetine (CYMBALTA) 60 MG capsule [Pharmacy Med Name: DULOXETINE DR 60MG  CAPSULES] 90 capsule 1    Sig: TAKE 1 CAPSULE(60 MG) BY MOUTH DAILY      Psychiatry: Antidepressants - SNRI Failed - 08/08/2020  8:20 PM      Failed - Valid encounter within last 6 months    Recent Outpatient Visits           7 months ago Anxiety state   [facility] Care at 08/10/2020, Sunday Shams, MD   11 months ago Hypothyroidism, unspecified type   [facility] at [facility], Sunday Shams, MD   1 year ago Benign prostatic hyperplasia with urinary frequency   [facility] at [facility], Sunday Shams, MD   1 year ago Puncture wound of left hand without foreign body, initial encounter   [facility] at [facility], Sunday Shams, MD   1 year ago Hyperlipidemia, unspecified hyperlipidemia type   [facility] at [facility], Sunday Shams, MD                Passed - Last BP in normal range    BP Readings from Last 1 Encounters:  01/18/20 111/73

## 2020-08-11 ENCOUNTER — Ambulatory Visit (INDEPENDENT_AMBULATORY_CARE_PROVIDER_SITE_OTHER): Payer: Medicare HMO | Admitting: Psychology

## 2020-08-11 DIAGNOSIS — F411 Generalized anxiety disorder: Secondary | ICD-10-CM | POA: Diagnosis not present

## 2020-08-23 ENCOUNTER — Ambulatory Visit (INDEPENDENT_AMBULATORY_CARE_PROVIDER_SITE_OTHER): Payer: Medicare HMO | Admitting: Psychology

## 2020-08-23 DIAGNOSIS — F411 Generalized anxiety disorder: Secondary | ICD-10-CM

## 2020-08-25 ENCOUNTER — Ambulatory Visit: Payer: Medicare Other | Admitting: Psychology

## 2020-08-30 ENCOUNTER — Other Ambulatory Visit: Payer: Self-pay | Admitting: Family Medicine

## 2020-08-30 NOTE — Telephone Encounter (Signed)
Requested medication (s) are due for refill today:   Yes  Requested medication (s) are on the active medication list:   Yes  Future visit scheduled:   No   Has not been seen in over a year.  Last several appts been cancelled    Last ordered: 08/04/2019 #90, 3 refills  Clinic note:  Returned for provider review.   Office visit overdue   Requested Prescriptions  Pending Prescriptions Disp Refills   atorvastatin (LIPITOR) 10 MG tablet [Pharmacy Med Name: ATORVASTATIN 10MG  TABLETS] 90 tablet 3    Sig: TAKE 1 TABLET(10 MG) BY MOUTH DAILY AT 6 PM      Cardiovascular:  Antilipid - Statins Failed - 08/30/2020  1:38 PM      Failed - Total Cholesterol in normal range and within 360 days    Cholesterol, Total  Date Value Ref Range Status  03/02/2019 153 100 - 199 mg/dL Final          Failed - LDL in normal range and within 360 days    LDL Chol Calc (NIH)  Date Value Ref Range Status  03/02/2019 71 0 - 99 mg/dL Final          Failed - HDL in normal range and within 360 days    HDL  Date Value Ref Range Status  03/02/2019 68 >39 mg/dL Final          Failed - Triglycerides in normal range and within 360 days    Triglycerides  Date Value Ref Range Status  03/02/2019 71 0 - 149 mg/dL Final          Passed - Patient is not pregnant      Passed - Valid encounter within last 12 months    Recent Outpatient Visits           7 months ago Anxiety state   Primary Care at 03/04/2019, Sunday Shams, MD   1 year ago Hypothyroidism, unspecified type   [facility] at [facility], Sunday Shams, MD   1 year ago Benign prostatic hyperplasia with urinary frequency   [facility] at [facility], Sunday Shams, MD   1 year ago Puncture wound of left hand without foreign body, initial encounter   [facility] at [facility], Sunday Shams, MD   1 year ago Hyperlipidemia, unspecified hyperlipidemia type   [facility] at [facility], Sunday Shams, MD

## 2020-09-08 ENCOUNTER — Ambulatory Visit (INDEPENDENT_AMBULATORY_CARE_PROVIDER_SITE_OTHER): Payer: Medicare HMO | Admitting: Psychology

## 2020-09-08 DIAGNOSIS — F411 Generalized anxiety disorder: Secondary | ICD-10-CM | POA: Diagnosis not present

## 2020-09-22 ENCOUNTER — Ambulatory Visit: Payer: Medicare Other | Admitting: Psychology

## 2020-10-06 ENCOUNTER — Ambulatory Visit: Payer: Medicare HMO | Admitting: Psychology

## 2020-10-20 ENCOUNTER — Ambulatory Visit (INDEPENDENT_AMBULATORY_CARE_PROVIDER_SITE_OTHER): Payer: Medicare HMO | Admitting: Psychology

## 2020-10-20 DIAGNOSIS — F411 Generalized anxiety disorder: Secondary | ICD-10-CM | POA: Diagnosis not present

## 2020-10-27 ENCOUNTER — Ambulatory Visit: Payer: Medicare Other | Admitting: Endocrinology

## 2020-11-03 ENCOUNTER — Ambulatory Visit: Payer: Medicare HMO | Admitting: Psychology

## 2020-11-17 ENCOUNTER — Ambulatory Visit: Payer: Medicare HMO | Admitting: Psychology

## 2020-12-01 ENCOUNTER — Ambulatory Visit (INDEPENDENT_AMBULATORY_CARE_PROVIDER_SITE_OTHER): Payer: Medicare HMO | Admitting: Psychology

## 2020-12-01 DIAGNOSIS — F331 Major depressive disorder, recurrent, moderate: Secondary | ICD-10-CM

## 2020-12-15 ENCOUNTER — Ambulatory Visit (INDEPENDENT_AMBULATORY_CARE_PROVIDER_SITE_OTHER): Payer: Medicare HMO | Admitting: Psychology

## 2020-12-15 DIAGNOSIS — F411 Generalized anxiety disorder: Secondary | ICD-10-CM | POA: Diagnosis not present

## 2021-01-12 ENCOUNTER — Ambulatory Visit (INDEPENDENT_AMBULATORY_CARE_PROVIDER_SITE_OTHER): Payer: Medicare HMO | Admitting: Psychology

## 2021-01-12 DIAGNOSIS — F411 Generalized anxiety disorder: Secondary | ICD-10-CM | POA: Diagnosis not present

## 2021-01-23 ENCOUNTER — Ambulatory Visit (INDEPENDENT_AMBULATORY_CARE_PROVIDER_SITE_OTHER): Payer: Medicare HMO | Admitting: Psychology

## 2021-01-23 DIAGNOSIS — F411 Generalized anxiety disorder: Secondary | ICD-10-CM | POA: Diagnosis not present

## 2021-02-09 ENCOUNTER — Ambulatory Visit: Payer: Medicare HMO | Admitting: Psychology

## 2021-02-21 ENCOUNTER — Ambulatory Visit (INDEPENDENT_AMBULATORY_CARE_PROVIDER_SITE_OTHER): Payer: Medicare HMO | Admitting: Psychology

## 2021-02-21 DIAGNOSIS — F411 Generalized anxiety disorder: Secondary | ICD-10-CM

## 2021-03-07 ENCOUNTER — Ambulatory Visit (INDEPENDENT_AMBULATORY_CARE_PROVIDER_SITE_OTHER): Payer: Medicare HMO | Admitting: Psychology

## 2021-03-07 DIAGNOSIS — F411 Generalized anxiety disorder: Secondary | ICD-10-CM | POA: Diagnosis not present

## 2021-03-21 ENCOUNTER — Ambulatory Visit (INDEPENDENT_AMBULATORY_CARE_PROVIDER_SITE_OTHER): Payer: Medicare HMO | Admitting: Psychology

## 2021-03-21 DIAGNOSIS — F411 Generalized anxiety disorder: Secondary | ICD-10-CM

## 2021-04-04 ENCOUNTER — Ambulatory Visit: Payer: Medicare HMO | Admitting: Psychology

## 2021-04-18 ENCOUNTER — Ambulatory Visit (INDEPENDENT_AMBULATORY_CARE_PROVIDER_SITE_OTHER): Payer: Medicare HMO | Admitting: Psychology

## 2021-04-18 DIAGNOSIS — F330 Major depressive disorder, recurrent, mild: Secondary | ICD-10-CM | POA: Diagnosis not present

## 2021-05-02 ENCOUNTER — Ambulatory Visit (INDEPENDENT_AMBULATORY_CARE_PROVIDER_SITE_OTHER): Payer: Medicare HMO | Admitting: Psychology

## 2021-05-02 DIAGNOSIS — F330 Major depressive disorder, recurrent, mild: Secondary | ICD-10-CM | POA: Diagnosis not present

## 2021-05-16 ENCOUNTER — Ambulatory Visit: Payer: Medicare HMO | Admitting: Psychology

## 2021-05-30 ENCOUNTER — Ambulatory Visit (INDEPENDENT_AMBULATORY_CARE_PROVIDER_SITE_OTHER): Payer: Medicare HMO | Admitting: Psychology

## 2021-05-30 DIAGNOSIS — F411 Generalized anxiety disorder: Secondary | ICD-10-CM

## 2021-05-30 DIAGNOSIS — F900 Attention-deficit hyperactivity disorder, predominantly inattentive type: Secondary | ICD-10-CM

## 2021-05-30 DIAGNOSIS — F330 Major depressive disorder, recurrent, mild: Secondary | ICD-10-CM | POA: Diagnosis not present

## 2021-05-30 NOTE — Progress Notes (Signed)
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 (Status: maintained) -- No Description Entered  Autonomic hyperactivity (e.g., palpitations, shortness of breath, dry mouth, trouble swallowing, nausea, diarrhea). (Status: maintained) -- No Description Entered  Childhood history of Attention Deficit Disorder (ADD) that was either diagnosed or later concluded due to the symptoms of behavioral problems at school, impulsivity, temper outbursts, and lack of concentration. (Status: maintained) -- No Description Entered  Depressed or irritable mood. (Status: maintained) -- No Description Entered  Diminished interest in or enjoyment of activities. (Status: maintained) -- No Description Entered  Disorganized in most areas of his/her life. (Status: maintained) -- No Description Entered  Easily distracted and drawn from task at hand. (Status: maintained) -- No Description Entered  Excessive and/or unrealistic worry that is difficult to control occurring more days than not for at least 6 months about a number of events or activities. (Status: maintained) -- No Description Entered  Feelings of hopelessness, worthlessness, or inappropriate guilt. (Status: maintained) -- No Description Entered  Lack of energy. (Status: maintained) -- No Description Entered  Low self-esteem. (Status: maintained) -- No Description Entered  Psychological or behavioral factors that influence the course of the medical condition. (Status: maintained) -- No Description Entered  Psychomotor agitation or retardation. (Status: maintained) -- No Description Entered  Sleeplessness or hypersomnia. (Status: maintained) -- No  Description Entered  Social withdrawal. (Status: maintained) -- No Description Entered  Unable to concentrate or pay attention to things of low interest, even when those things are important to his/her life. (Status: maintained) -- No Description Entered  Medication Status compliance  Safety none  If Suicidal or Homicidal State Action Taken: unspecified  Current Risk: low Medications unspecified Objectives Related Problem: Accept the role of psychological or behavioral factors in development of the medical condition and focus on resolution of these factors. Description: Identify feelings associated with the medical condition. Target Date: 2021-10-20 Frequency: Biweekly Modality: individual Progress: 60%  Related Problem: Accept the role of psychological or behavioral factors in development of the medical condition and focus on resolution of these factors. Description: Learn and implement skills for managing stress. Target Date: 2021-10-20 Frequency: Biweekly Modality: individual Progress: 30%  Related Problem: Accept the role of psychological or behavioral factors in development of the medical condition and focus on resolution of these factors. Description: Identify sources of emotional distress that could have a negative impact on physical health. Target Date: 2021-10-20 Frequency: Biweekly Modality: individual Progress: 30%  Related Problem: Accept the role of psychological or behavioral factors in development of the medical condition and focus on resolution of these factors. Description: Engage in faith-based activities as a source of comfort and hope. Target Date: 2021-10-20 Frequency: Biweekly Modality: individual Progress: 60%  Related Problem: Accept the role of psychological or behavioral factors in development of the medical condition and focus on resolution of these factors. Description: Engage in social, productive, and recreational activities that are possible in  spite of medical condition. Target Date: 2021-10-20 Frequency: Biweekly Modality: individual Progress: 20%  Related Problem: Accept the role of psychological or behavioral factors in development of the medical condition and focus on resolution of these factors. Description: Comply with the medication regimen and necessary medical procedures, reporting any side effects or problems to physicians or therapists. Target Date:  2021-10-20 Frequency: Biweekly Modality: individual Progress: 100%  Related Problem: Accept the role of psychological or behavioral factors in development of the medical condition and focus on resolution of these factors. Description: Commit to learning and implementing a proactive approach to managing personal stresses introduced by the medical condition/diagnosis. Target Date: 2021-10-20 Frequency: Biweekly Modality: individual Progress: 30%  Related Problem: Develop healthy interpersonal relationships that lead to the alleviation and help prevent the relapse of depression. Description: Learn and implement relapse prevention skills. Target Date: 2021-10-20 Frequency: Biweekly Modality: individual Progress: 0%  Related Problem: Develop healthy interpersonal relationships that lead to the alleviation and help prevent the relapse of depression. Description: Learn and implement conflict resolution skills to resolve interpersonal problems. Target Date: 2021-10-20 Frequency: Biweekly Modality: individual Progress: 10%  Related Problem: Develop healthy interpersonal relationships that lead to the alleviation and help prevent the relapse of depression. Description: Learn and implement problem-solving and decision-making skills. Target Date: 2021-10-20 Frequency: Biweekly Modality: individual Progress: 10%  Related Problem: Develop healthy interpersonal relationships that lead to the alleviation and help prevent the relapse of depression. Description: Identify and  replace thoughts and beliefs that support depression. Target Date: 2021-10-20 Frequency: Biweekly Modality: individual Progress: 20%  Related Problem: Develop healthy interpersonal relationships that lead to the alleviation and help prevent the relapse of depression. Description: Learn and implement behavioral strategies to overcome depression. Target Date: 2021-10-20 Frequency: Biweekly Modality: individual Progress: 10%  Related Problem: Develop healthy interpersonal relationships that lead to the alleviation and help prevent the relapse of depression. Description: Increasingly verbalize hopeful and positive statements regarding self, others, and the future. Target Date: 2021-10-20 Frequency: Biweekly Modality: individual Progress: 30%  Related Problem: Learn and implement coping skills that result in a reduction of anxiety and worry, and improved daily functioning. Description: Cooperate with a medication evaluation by a physician. Target Date: 2021-10-20 Frequency: Biweekly Modality: individual Progress: 100% Planned Intervention: Monitor the client's psychotropic medication compliance, side effects, and effectiveness; confer regularly with the physician.  Related Problem: Learn and implement coping skills that result in a reduction of anxiety and worry, and improved daily functioning. Description: Identify and engage in pleasant activities on a daily basis. Target Date: 2021-10-20 Frequency: Biweekly Modality: individual Progress: 30%  Related Problem: Learn and implement coping skills that result in a reduction of anxiety and worry, and improved daily functioning. Description: Learn and implement problem-solving strategies for realistically addressing worries. Target Date: 2021-10-20 Frequency: Biweekly Modality: individual Progress: 20%  Related Problem: Learn and implement coping skills that result in a reduction of anxiety and worry, and improved daily  functioning. Description: Identify, challenge, and replace biased, fearful self-talk with positive, realistic, and empowering self-talk. Target Date: 2021-10-20 Frequency: Biweekly Modality: individual Progress: 20%  Related Problem: Learn and implement coping skills that result in a reduction of anxiety and worry, and improved daily functioning. Description: Verbalize an understanding of the role that cognitive biases play in excessive irrational worry and persistent anxiety symptoms. Target Date: 2021-10-20 Frequency: Biweekly Modality: individual Progress: 20%  Related Problem: Learn and implement coping skills that result in a reduction of anxiety and worry, and improved daily functioning. Description: Learn and implement calming skills to reduce overall anxiety and manage anxiety symptoms. Target Date: 2021-10-20 Frequency: Biweekly Modality: individual Progress: 30%  Related Problem: Learn and implement coping skills that result in a reduction of anxiety and worry, and improved daily functioning. Description: Reestablish a consistent sleep-wake cycle. Target Date: 2020-10-20 Frequency: Biweekly Modality: individual Progress: 90%  Related Problem: Learn  and implement coping skills that result in a reduction of anxiety and worry, and improved daily functioning. Description: Learn to accept limitations in life and commit to tolerating, rather than avoiding, unpleasant emotions while accomplishing meaningful goals. Target Date: 2021-10-20 Frequency: Biweekly Modality: individual Progress: 30%  Related Problem: Learn and implement coping skills that result in a reduction of anxiety and worry, and improved daily functioning. Description: Learn and implement relapse prevention strategies for managing possible future anxiety symptoms. Target Date: 2021-10-20 Frequency: Biweekly Modality: individual Progress: 0%  Related Problem: Learn and implement coping skills that result in  a reduction of anxiety and worry, and improved daily functioning. Description: Learn and implement personal and interpersonal skills to reduce anxiety and improve interpersonal relationships. Target Date: 2021-10-20 Frequency: Biweekly Modality: individual Progress: 10%  Related Problem: Sustain attention and concentration for consistently longer periods of time. Description: Combine skills learned in therapy into a new daily approach to managing ADHD. Target Date: 2021-10-20 Frequency: Biweekly Modality: individual Progress: 30%  Related Problem: Sustain attention and concentration for consistently longer periods of time. Description: Learn and implement skills to reduce procrastination. Target Date: 2021-10-20 Frequency: Biweekly Modality: individual Progress: 10%  Related Problem: Sustain attention and concentration for consistently longer periods of time. Description: Learn and implement skills to reduce the disruptive influence of distractibility. Target Date: 2021-10-20 Frequency: Biweekly Modality: individual Progress: 0% Planned Intervention: Teach the client stimulus control techniques that use external structure (e.g., lists, reminders, files, daily rituals) to improve on-task behavior; remove distracting stimuli in the environment; encourage the client to reward himself/herself for successful focus and follow-through.  Related Problem: Sustain attention and concentration for consistently longer periods of time. Description: Increase knowledge of ADHD and its treatment. Target Date: 2021-10-20 Frequency: Biweekly Modality: individual Progress: 20%  Related Problem: Sustain attention and concentration for consistently longer periods of time. Description: List coping skills that will be used to manage ADD symptoms. Target Date: 2021-10-20 Frequency: Biweekly Modality: individual Progress: 20%  Client Response full compliance  Service Location Location, [address] [place], [place] [address]  Service Code cpt [phone]   Interventions: Provide education, information   Identify automatic thoughts  Rationally challenge thoughts or beliefs/cognitive restructuring  Lifestyle change (exercise, nutrition)  Self care activities  Validate/empathize  Identify/label emotions  Comments     Today I met with  [name] in remote video (WebEx) face-to-face individual psychotherapy as an accomodation to the COVID-19 Pandemic.  Distance Site: Client's Home Orginating Site: Dr Jannifer Franklin Remote Office Consent: Obtained verbal consent to transmit  session remotely   [name] reports that his mother seems to be worse.  He suspects that the stress of her living situation is a big contributing factor.  We d/e/p the situation, how it is effecting his mood states and agreed to next steps.  [name] was encouraged to get more regular exercise as he has stopped attending the gym.  I made a connection between his sleep disturbance, anxiety over his mother and decreased exercise.  While he continues to do his PT exercises at home, he is missing the social component of see other people at the gym.  Progress: Patient is maintaining a moderate level of anxiety aeb frequent episodes of over thinking and worry about medical issues. Pt's mother is an ongoing source of worry, increased stress and depression.  We reviewed my vacation schedule and how I can be reached in the case of an emergency.  Royetta Crochet, PhD.

## 2021-06-13 ENCOUNTER — Ambulatory Visit: Payer: Medicare HMO | Admitting: Psychology

## 2021-06-27 ENCOUNTER — Ambulatory Visit (INDEPENDENT_AMBULATORY_CARE_PROVIDER_SITE_OTHER): Payer: Medicare HMO | Admitting: Psychology

## 2021-06-27 DIAGNOSIS — F900 Attention-deficit hyperactivity disorder, predominantly inattentive type: Secondary | ICD-10-CM | POA: Diagnosis not present

## 2021-06-27 NOTE — Progress Notes (Signed)
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 (Status: maintained) -- No Description Entered  Autonomic hyperactivity (e.g., palpitations, shortness of breath, dry mouth, trouble swallowing, nausea, diarrhea). (Status: maintained) -- No Description Entered  Childhood history of Attention Deficit Disorder (ADD) that was either diagnosed or later concluded due to the symptoms of behavioral problems at school, impulsivity, temper outbursts, and lack of concentration. (Status: maintained) -- No Description Entered  Depressed or irritable mood. (Status: maintained) -- No Description Entered  Diminished interest in or enjoyment of activities. (Status: maintained) -- No Description Entered  Disorganized in most areas of his/her life. (Status: maintained) -- No Description Entered  Easily distracted and drawn from task at hand. (Status: maintained) -- No Description Entered  Excessive and/or unrealistic worry that is difficult to control occurring more days than not for at least 6 months about a number of events or activities. (Status: maintained) -- No Description Entered  Feelings of hopelessness, worthlessness, or inappropriate guilt. (Status: maintained) -- No Description Entered  Lack of energy. (Status: maintained) -- No Description Entered  Low self-esteem. (Status: maintained) -- No Description Entered  Psychological or behavioral factors that influence the course of the medical condition. (Status: maintained) -- No Description Entered  Psychomotor agitation or retardation. (Status: maintained) -- No Description Entered  Sleeplessness or hypersomnia. (Status: maintained) -- No  Description Entered  Social withdrawal. (Status: maintained) -- No Description Entered  Unable to concentrate or pay attention to things of low interest, even when those things are important to his/her life. (Status: maintained) -- No Description Entered  Medication Status compliance  Safety none  If Suicidal or Homicidal State Action Taken: unspecified  Current Risk: low Medications unspecified Objectives Related Problem: Accept the role of psychological or behavioral factors in development of the medical condition and focus on resolution of these factors. Description: Identify feelings associated with the medical condition. Target Date: 2021-10-20 Frequency: Biweekly Modality: individual Progress: 60%  Related Problem: Accept the role of psychological or behavioral factors in development of the medical condition and focus on resolution of these factors. Description: Learn and implement skills for managing stress. Target Date: 2021-10-20 Frequency: Biweekly Modality: individual Progress: 30%  Related Problem: Accept the role of psychological or behavioral factors in development of the medical condition and focus on resolution of these factors. Description: Identify sources of emotional distress that could have a negative impact on physical health. Target Date: 2021-10-20 Frequency: Biweekly Modality: individual Progress: 30%  Related Problem: Accept the role of psychological or behavioral factors in development of the medical condition and focus on resolution of these factors. Description: Engage in faith-based activities as a source of comfort and hope. Target Date: 2021-10-20 Frequency: Biweekly Modality: individual Progress: 60%  Related Problem: Accept the role of psychological or behavioral factors in development of the medical condition and focus on resolution of these factors. Description: Engage in social, productive, and recreational activities that are possible in  spite of medical condition. Target Date: 2021-10-20 Frequency: Biweekly Modality: individual Progress: 20%  Related Problem: Accept the role of psychological or behavioral factors in development of the medical condition and focus on resolution of these factors. Description: Comply with the medication regimen and necessary medical procedures, reporting any side effects or problems to physicians or therapists. Target Date:  2021-10-20 Frequency: Biweekly Modality: individual Progress: 100%  Related Problem: Accept the role of psychological or behavioral factors in development of the medical condition and focus on resolution of these factors. Description: Commit to learning and implementing a proactive approach to managing personal stresses introduced by the medical condition/diagnosis. Target Date: 2021-10-20 Frequency: Biweekly Modality: individual Progress: 30%  Related Problem: Develop healthy interpersonal relationships that lead to the alleviation and help prevent the relapse of depression. Description: Learn and implement relapse prevention skills. Target Date: 2021-10-20 Frequency: Biweekly Modality: individual Progress: 0%  Related Problem: Develop healthy interpersonal relationships that lead to the alleviation and help prevent the relapse of depression. Description: Learn and implement conflict resolution skills to resolve interpersonal problems. Target Date: 2021-10-20 Frequency: Biweekly Modality: individual Progress: 10%  Related Problem: Develop healthy interpersonal relationships that lead to the alleviation and help prevent the relapse of depression. Description: Learn and implement problem-solving and decision-making skills. Target Date: 2021-10-20 Frequency: Biweekly Modality: individual Progress: 10%  Related Problem: Develop healthy interpersonal relationships that lead to the alleviation and help prevent the relapse of depression. Description: Identify and  replace thoughts and beliefs that support depression. Target Date: 2021-10-20 Frequency: Biweekly Modality: individual Progress: 20%  Related Problem: Develop healthy interpersonal relationships that lead to the alleviation and help prevent the relapse of depression. Description: Learn and implement behavioral strategies to overcome depression. Target Date: 2021-10-20 Frequency: Biweekly Modality: individual Progress: 10%  Related Problem: Develop healthy interpersonal relationships that lead to the alleviation and help prevent the relapse of depression. Description: Increasingly verbalize hopeful and positive statements regarding self, others, and the future. Target Date: 2021-10-20 Frequency: Biweekly Modality: individual Progress: 30%  Related Problem: Learn and implement coping skills that result in a reduction of anxiety and worry, and improved daily functioning. Description: Cooperate with a medication evaluation by a physician. Target Date: 2021-10-20 Frequency: Biweekly Modality: individual Progress: 100% Planned Intervention: Monitor the client's psychotropic medication compliance, side effects, and effectiveness; confer regularly with the physician.  Related Problem: Learn and implement coping skills that result in a reduction of anxiety and worry, and improved daily functioning. Description: Identify and engage in pleasant activities on a daily basis. Target Date: 2021-10-20 Frequency: Biweekly Modality: individual Progress: 30%  Related Problem: Learn and implement coping skills that result in a reduction of anxiety and worry, and improved daily functioning. Description: Learn and implement problem-solving strategies for realistically addressing worries. Target Date: 2021-10-20 Frequency: Biweekly Modality: individual Progress: 20%  Related Problem: Learn and implement coping skills that result in a reduction of anxiety and worry, and improved daily  functioning. Description: Identify, challenge, and replace biased, fearful self-talk with positive, realistic, and empowering self-talk. Target Date: 2021-10-20 Frequency: Biweekly Modality: individual Progress: 20%  Related Problem: Learn and implement coping skills that result in a reduction of anxiety and worry, and improved daily functioning. Description: Verbalize an understanding of the role that cognitive biases play in excessive irrational worry and persistent anxiety symptoms. Target Date: 2021-10-20 Frequency: Biweekly Modality: individual Progress: 20%  Related Problem: Learn and implement coping skills that result in a reduction of anxiety and worry, and improved daily functioning. Description: Learn and implement calming skills to reduce overall anxiety and manage anxiety symptoms. Target Date: 2021-10-20 Frequency: Biweekly Modality: individual Progress: 30%  Related Problem: Learn and implement coping skills that result in a reduction of anxiety and worry, and improved daily functioning. Description: Reestablish a consistent sleep-wake cycle. Target Date: 2020-10-20 Frequency: Biweekly Modality: individual Progress: 90%  Related Problem: Learn  and implement coping skills that result in a reduction of anxiety and worry, and improved daily functioning. Description: Learn to accept limitations in life and commit to tolerating, rather than avoiding, unpleasant emotions while accomplishing meaningful goals. Target Date: 2021-10-20 Frequency: Biweekly Modality: individual Progress: 30%  Related Problem: Learn and implement coping skills that result in a reduction of anxiety and worry, and improved daily functioning. Description: Learn and implement relapse prevention strategies for managing possible future anxiety symptoms. Target Date: 2021-10-20 Frequency: Biweekly Modality: individual Progress: 0%  Related Problem: Learn and implement coping skills that result in  a reduction of anxiety and worry, and improved daily functioning. Description: Learn and implement personal and interpersonal skills to reduce anxiety and improve interpersonal relationships. Target Date: 2021-10-20 Frequency: Biweekly Modality: individual Progress: 10%  Related Problem: Sustain attention and concentration for consistently longer periods of time. Description: Combine skills learned in therapy into a new daily approach to managing ADHD. Target Date: 2021-10-20 Frequency: Biweekly Modality: individual Progress: 30%  Related Problem: Sustain attention and concentration for consistently longer periods of time. Description: Learn and implement skills to reduce procrastination. Target Date: 2021-10-20 Frequency: Biweekly Modality: individual Progress: 10%  Related Problem: Sustain attention and concentration for consistently longer periods of time. Description: Learn and implement skills to reduce the disruptive influence of distractibility. Target Date: 2021-10-20 Frequency: Biweekly Modality: individual Progress: 0% Planned Intervention: Teach the client stimulus control techniques that use external structure (e.g., lists, reminders, files, daily rituals) to improve on-task behavior; remove distracting stimuli in the environment; encourage the client to reward himself/herself for successful focus and follow-through.  Related Problem: Sustain attention and concentration for consistently longer periods of time. Description: Increase knowledge of ADHD and its treatment. Target Date: 2021-10-20 Frequency: Biweekly Modality: individual Progress: 20%  Related Problem: Sustain attention and concentration for consistently longer periods of time. Description: List coping skills that will be used to manage ADD symptoms. Target Date: 2021-10-20 Frequency: Biweekly Modality: individual Progress: 20%  Client Response full compliance  Service Location Location, [address] [place], [place] [address]  Service Code cpt [id number]   Interventions: Provide education, information   Identify automatic thoughts  Rationally challenge thoughts or beliefs/cognitive restructuring  Lifestyle change (exercise, nutrition)  Self care activities  Validate/empathize  Identify/label emotions  Comments   Today I met with  Mark Strong in remote video (WebEx) face-to-face individual psychotherapy as an accomodation to the COVID-19 Pandemic.  Distance Site: Client's Home Orginating Site: Dr Jannifer Franklin Remote Office Consent: Obtained verbal consent to transmit  session remotely   Mark Strong reports that he continued to struggle with sleep and his PCP prescribed a sleep aid.  However, he is still having some challenges.  He admitted that he was at times not going to sleep until 2 am as he lost track of time. I reviewed psycho education about the need for regulating habits and routines to regulate mood.   We approached the problem from the perspective of his ADHD.  We d/ and p/s and Mark Strong agreed to use various technologies to assist him to regulate his sleep-wake cycles.  Mark Strong states that he has an initial intake (Screening?) appointment to determine if the clinic will do a full ADHD evaluation.  We reviewed his history and sxs to share with the evaluator and what to expect.  Progress: Patient is maintaining a moderate level of anxiety aeb frequent episodes of over thinking and worry about medical issues. Pt's mother is an ongoing source of worry, increased  stress and depression.   Royetta Crochet, PhD.

## 2021-07-11 ENCOUNTER — Ambulatory Visit (INDEPENDENT_AMBULATORY_CARE_PROVIDER_SITE_OTHER): Payer: Medicare HMO | Admitting: Psychology

## 2021-07-11 DIAGNOSIS — F411 Generalized anxiety disorder: Secondary | ICD-10-CM

## 2021-07-11 NOTE — Progress Notes (Signed)
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 (Status: maintained) -- No Description Entered  Autonomic hyperactivity (e.g., palpitations, shortness of breath, dry mouth, trouble swallowing, nausea, diarrhea). (Status: maintained) -- No Description Entered  Childhood history of Attention Deficit Disorder (ADD) that was either diagnosed or later concluded due to the symptoms of behavioral problems at school, impulsivity, temper outbursts, and lack of concentration. (Status: maintained) -- No Description Entered  Depressed or irritable mood. (Status: maintained) -- No Description Entered  Diminished interest in or enjoyment of activities. (Status: maintained) -- No Description Entered  Disorganized in most areas of his/her life. (Status: maintained) -- No Description Entered  Easily distracted and drawn from task at hand. (Status: maintained) -- No Description Entered  Excessive and/or unrealistic worry that is difficult to control occurring more days than not for at least 6 months about a number of events or activities. (Status: maintained) -- No Description Entered  Feelings of hopelessness, worthlessness, or inappropriate guilt. (Status: maintained) -- No Description Entered  Lack of energy. (Status: maintained) -- No Description Entered  Low self-esteem. (Status: maintained) -- No Description Entered  Psychological or behavioral factors that influence the course of the medical condition. (Status: maintained) -- No Description Entered  Psychomotor agitation or retardation. (Status: maintained) -- No Description Entered  Sleeplessness or hypersomnia. (Status: maintained) -- No  Description Entered  Social withdrawal. (Status: maintained) -- No Description Entered  Unable to concentrate or pay attention to things of low interest, even when those things are important to his/her life. (Status: maintained) -- No Description Entered  Medication Status compliance  Safety none  If Suicidal or Homicidal State Action Taken: unspecified  Current Risk: low Medications unspecified Objectives Related Problem: Accept the role of psychological or behavioral factors in development of the medical condition and focus on resolution of these factors. Description: Identify feelings associated with the medical condition. Target Date: 2021-10-20 Frequency: Biweekly Modality: individual Progress: 60%  Related Problem: Accept the role of psychological or behavioral factors in development of the medical condition and focus on resolution of these factors. Description: Learn and implement skills for managing stress. Target Date: 2021-10-20 Frequency: Biweekly Modality: individual Progress: 30%  Related Problem: Accept the role of psychological or behavioral factors in development of the medical condition and focus on resolution of these factors. Description: Identify sources of emotional distress that could have a negative impact on physical health. Target Date: 2021-10-20 Frequency: Biweekly Modality: individual Progress: 30%  Related Problem: Accept the role of psychological or behavioral factors in development of the medical condition and focus on resolution of these factors. Description: Engage in faith-based activities as a source of comfort and hope. Target Date: 2021-10-20 Frequency: Biweekly Modality: individual Progress: 60%  Related Problem: Accept the role of psychological or behavioral factors in development of the medical condition and focus on resolution of these factors. Description: Engage in social, productive, and recreational activities that are possible in  spite of medical condition. Target Date: 2021-10-20 Frequency: Biweekly Modality: individual Progress: 20%  Related Problem: Accept the role of psychological or behavioral factors in development of the medical condition and focus on resolution of these factors. Description: Comply with the medication regimen and necessary medical procedures, reporting any side effects or problems to physicians or therapists. Target Date:  2021-10-20 Frequency: Biweekly Modality: individual Progress: 100%  Related Problem: Accept the role of psychological or behavioral factors in development of the medical condition and focus on resolution of these factors. Description: Commit to learning and implementing a proactive approach to managing personal stresses introduced by the medical condition/diagnosis. Target Date: 2021-10-20 Frequency: Biweekly Modality: individual Progress: 30%  Related Problem: Develop healthy interpersonal relationships that lead to the alleviation and help prevent the relapse of depression. Description: Learn and implement relapse prevention skills. Target Date: 2021-10-20 Frequency: Biweekly Modality: individual Progress: 0%  Related Problem: Develop healthy interpersonal relationships that lead to the alleviation and help prevent the relapse of depression. Description: Learn and implement conflict resolution skills to resolve interpersonal problems. Target Date: 2021-10-20 Frequency: Biweekly Modality: individual Progress: 10%  Related Problem: Develop healthy interpersonal relationships that lead to the alleviation and help prevent the relapse of depression. Description: Learn and implement problem-solving and decision-making skills. Target Date: 2021-10-20 Frequency: Biweekly Modality: individual Progress: 10%  Related Problem: Develop healthy interpersonal relationships that lead to the alleviation and help prevent the relapse of depression. Description: Identify and  replace thoughts and beliefs that support depression. Target Date: 2021-10-20 Frequency: Biweekly Modality: individual Progress: 20%  Related Problem: Develop healthy interpersonal relationships that lead to the alleviation and help prevent the relapse of depression. Description: Learn and implement behavioral strategies to overcome depression. Target Date: 2021-10-20 Frequency: Biweekly Modality: individual Progress: 10%  Related Problem: Develop healthy interpersonal relationships that lead to the alleviation and help prevent the relapse of depression. Description: Increasingly verbalize hopeful and positive statements regarding self, others, and the future. Target Date: 2021-10-20 Frequency: Biweekly Modality: individual Progress: 30%  Related Problem: Learn and implement coping skills that result in a reduction of anxiety and worry, and improved daily functioning. Description: Cooperate with a medication evaluation by a physician. Target Date: 2021-10-20 Frequency: Biweekly Modality: individual Progress: 100% Planned Intervention: Monitor the client's psychotropic medication compliance, side effects, and effectiveness; confer regularly with the physician.  Related Problem: Learn and implement coping skills that result in a reduction of anxiety and worry, and improved daily functioning. Description: Identify and engage in pleasant activities on a daily basis. Target Date: 2021-10-20 Frequency: Biweekly Modality: individual Progress: 30%  Related Problem: Learn and implement coping skills that result in a reduction of anxiety and worry, and improved daily functioning. Description: Learn and implement problem-solving strategies for realistically addressing worries. Target Date: 2021-10-20 Frequency: Biweekly Modality: individual Progress: 20%  Related Problem: Learn and implement coping skills that result in a reduction of anxiety and worry, and improved daily  functioning. Description: Identify, challenge, and replace biased, fearful self-talk with positive, realistic, and empowering self-talk. Target Date: 2021-10-20 Frequency: Biweekly Modality: individual Progress: 20%  Related Problem: Learn and implement coping skills that result in a reduction of anxiety and worry, and improved daily functioning. Description: Verbalize an understanding of the role that cognitive biases play in excessive irrational worry and persistent anxiety symptoms. Target Date: 2021-10-20 Frequency: Biweekly Modality: individual Progress: 20%  Related Problem: Learn and implement coping skills that result in a reduction of anxiety and worry, and improved daily functioning. Description: Learn and implement calming skills to reduce overall anxiety and manage anxiety symptoms. Target Date: 2021-10-20 Frequency: Biweekly Modality: individual Progress: 30%  Related Problem: Learn and implement coping skills that result in a reduction of anxiety and worry, and improved daily functioning. Description: Reestablish a consistent sleep-wake cycle. Target Date: 2020-10-20 Frequency: Biweekly Modality: individual Progress: 90%  Related Problem: Learn  and implement coping skills that result in a reduction of anxiety and worry, and improved daily functioning. Description: Learn to accept limitations in life and commit to tolerating, rather than avoiding, unpleasant emotions while accomplishing meaningful goals. Target Date: 2021-10-20 Frequency: Biweekly Modality: individual Progress: 30%  Related Problem: Learn and implement coping skills that result in a reduction of anxiety and worry, and improved daily functioning. Description: Learn and implement relapse prevention strategies for managing possible future anxiety symptoms. Target Date: 2021-10-20 Frequency: Biweekly Modality: individual Progress: 0%  Related Problem: Learn and implement coping skills that result in  a reduction of anxiety and worry, and improved daily functioning. Description: Learn and implement personal and interpersonal skills to reduce anxiety and improve interpersonal relationships. Target Date: 2021-10-20 Frequency: Biweekly Modality: individual Progress: 10%  Related Problem: Sustain attention and concentration for consistently longer periods of time. Description: Combine skills learned in therapy into a new daily approach to managing ADHD. Target Date: 2021-10-20 Frequency: Biweekly Modality: individual Progress: 30%  Related Problem: Sustain attention and concentration for consistently longer periods of time. Description: Learn and implement skills to reduce procrastination. Target Date: 2021-10-20 Frequency: Biweekly Modality: individual Progress: 10%  Related Problem: Sustain attention and concentration for consistently longer periods of time. Description: Learn and implement skills to reduce the disruptive influence of distractibility. Target Date: 2021-10-20 Frequency: Biweekly Modality: individual Progress: 0% Planned Intervention: Teach the client stimulus control techniques that use external structure (e.g., lists, reminders, files, daily rituals) to improve on-task behavior; remove distracting stimuli in the environment; encourage the client to reward himself/herself for successful focus and follow-through.  Related Problem: Sustain attention and concentration for consistently longer periods of time. Description: Increase knowledge of ADHD and its treatment. Target Date: 2021-10-20 Frequency: Biweekly Modality: individual Progress: 20%  Related Problem: Sustain attention and concentration for consistently longer periods of time. Description: List coping skills that will be used to manage ADD symptoms. Target Date: 2021-10-20 Frequency: Biweekly Modality: individual Progress: 20%  Client Response full compliance  Service Location Location, [address] [place], [place] [address]  Service Code cpt [phone]   Interventions: Provide education, information   Identify automatic thoughts  Rationally challenge thoughts or beliefs/cognitive restructuring  Lifestyle change (exercise, nutrition)  Self care activities  Validate/empathize  Identify/label emotions  Comments   Today I met with  Mark Strong in remote video (WebEx) face-to-face individual psychotherapy as an accomodation to the COVID-19 Pandemic.  Distance Site: Client's Home Orginating Site: Dr Jannifer Franklin Remote Office Consent: Obtained verbal consent to transmit  session remotely   Mark Strong reports that he has an appointment with his endocrinologist to f/u on his thyroid health.  His PCP states that his "levels" were off and he lower the dose of his medication.  Mark Strong felt uncomfortable and thought he needed to establish a relationship with a provider in the area for f/u.    Mark Strong states that he has continued to attend church and his bible class on Sunday's.  He talked about how it has been positive  to have some connection with community.  Mark Strong has also been feeling more engaged with projects around the house and has been attempting simple projects.  In general, he is feeling better about himself and is less anxious.   Progress: Patient is maintaining a moderate level of anxiety aeb frequent episodes of over thinking and worry about medical issues. Pt's mother is an ongoing source of worry, increased stress and depression.   Royetta Crochet, PhD.  Royetta Crochet, PhD

## 2021-07-25 ENCOUNTER — Ambulatory Visit (INDEPENDENT_AMBULATORY_CARE_PROVIDER_SITE_OTHER): Payer: Medicare HMO | Admitting: Psychology

## 2021-07-25 DIAGNOSIS — F411 Generalized anxiety disorder: Secondary | ICD-10-CM | POA: Diagnosis not present

## 2021-07-25 NOTE — Progress Notes (Signed)
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 (Status: maintained) -- No Description Entered  Autonomic hyperactivity (e.g., palpitations, shortness of breath, dry mouth, trouble swallowing, nausea, diarrhea). (Status: maintained) -- No Description Entered  Childhood history of Attention Deficit Disorder (ADD) that was either diagnosed or later concluded due to the symptoms of behavioral problems at school, impulsivity, temper outbursts, and lack of concentration. (Status: maintained) -- No Description Entered  Depressed or irritable mood. (Status: maintained) -- No Description Entered  Diminished interest in or enjoyment of activities. (Status: maintained) -- No Description Entered  Disorganized in most areas of his/her life. (Status: maintained) -- No Description Entered  Easily distracted and drawn from task at hand. (Status: maintained) -- No Description Entered  Excessive and/or unrealistic worry that is difficult to control occurring more days than not for at least 6 months about a number of events or activities. (Status: maintained) -- No Description Entered  Feelings of hopelessness, worthlessness, or inappropriate guilt. (Status: maintained) -- No Description Entered  Lack of energy. (Status: maintained) -- No Description Entered  Low self-esteem. (Status: maintained) -- No Description Entered  Psychological or behavioral factors that influence the course of the medical condition. (Status: maintained) -- No Description Entered  Psychomotor agitation or retardation. (Status: maintained) -- No Description Entered  Sleeplessness or hypersomnia. (Status: maintained) -- No  Description Entered  Social withdrawal. (Status: maintained) -- No Description Entered  Unable to concentrate or pay attention to things of low interest, even when those things are important to his/her life. (Status: maintained) -- No Description Entered  Medication Status compliance  Safety none  If Suicidal or Homicidal State Action Taken: unspecified  Current Risk: low Medications unspecified Objectives Related Problem: Accept the role of psychological or behavioral factors in development of the medical condition and focus on resolution of these factors. Description: Identify feelings associated with the medical condition. Target Date: 2021-10-20 Frequency: Biweekly Modality: individual Progress: 60%  Related Problem: Accept the role of psychological or behavioral factors in development of the medical condition and focus on resolution of these factors. Description: Learn and implement skills for managing stress. Target Date: 2021-10-20 Frequency: Biweekly Modality: individual Progress: 30%  Related Problem: Accept the role of psychological or behavioral factors in development of the medical condition and focus on resolution of these factors. Description: Identify sources of emotional distress that could have a negative impact on physical health. Target Date: 2021-10-20 Frequency: Biweekly Modality: individual Progress: 30%  Related Problem: Accept the role of psychological or behavioral factors in development of the medical condition and focus on resolution of these factors. Description: Engage in faith-based activities as a source of comfort and hope. Target Date: 2021-10-20 Frequency: Biweekly Modality: individual Progress: 60%  Related Problem: Accept the role of psychological or behavioral factors in development of the medical condition and focus on resolution of these factors. Description: Engage in social, productive, and recreational activities that are possible in  spite of medical condition. Target Date: 2021-10-20 Frequency: Biweekly Modality: individual Progress: 20%  Related Problem: Accept the role of psychological or behavioral factors in development of the medical condition and focus on resolution of these factors. Description: Comply with the medication regimen and necessary medical procedures, reporting any side effects or problems to physicians or therapists. Target Date:  2021-10-20 Frequency: Biweekly Modality: individual Progress: 100%  Related Problem: Accept the role of psychological or behavioral factors in development of the medical condition and focus on resolution of these factors. Description: Commit to learning and implementing a proactive approach to managing personal stresses introduced by the medical condition/diagnosis. Target Date: 2021-10-20 Frequency: Biweekly Modality: individual Progress: 30%  Related Problem: Develop healthy interpersonal relationships that lead to the alleviation and help prevent the relapse of depression. Description: Learn and implement relapse prevention skills. Target Date: 2021-10-20 Frequency: Biweekly Modality: individual Progress: 0%  Related Problem: Develop healthy interpersonal relationships that lead to the alleviation and help prevent the relapse of depression. Description: Learn and implement conflict resolution skills to resolve interpersonal problems. Target Date: 2021-10-20 Frequency: Biweekly Modality: individual Progress: 10%  Related Problem: Develop healthy interpersonal relationships that lead to the alleviation and help prevent the relapse of depression. Description: Learn and implement problem-solving and decision-making skills. Target Date: 2021-10-20 Frequency: Biweekly Modality: individual Progress: 10%  Related Problem: Develop healthy interpersonal relationships that lead to the alleviation and help prevent the relapse of depression. Description: Identify and  replace thoughts and beliefs that support depression. Target Date: 2021-10-20 Frequency: Biweekly Modality: individual Progress: 20%  Related Problem: Develop healthy interpersonal relationships that lead to the alleviation and help prevent the relapse of depression. Description: Learn and implement behavioral strategies to overcome depression. Target Date: 2021-10-20 Frequency: Biweekly Modality: individual Progress: 10%  Related Problem: Develop healthy interpersonal relationships that lead to the alleviation and help prevent the relapse of depression. Description: Increasingly verbalize hopeful and positive statements regarding self, others, and the future. Target Date: 2021-10-20 Frequency: Biweekly Modality: individual Progress: 30%  Related Problem: Learn and implement coping skills that result in a reduction of anxiety and worry, and improved daily functioning. Description: Cooperate with a medication evaluation by a physician. Target Date: 2021-10-20 Frequency: Biweekly Modality: individual Progress: 100% Planned Intervention: Monitor the client's psychotropic medication compliance, side effects, and effectiveness; confer regularly with the physician.  Related Problem: Learn and implement coping skills that result in a reduction of anxiety and worry, and improved daily functioning. Description: Identify and engage in pleasant activities on a daily basis. Target Date: 2021-10-20 Frequency: Biweekly Modality: individual Progress: 30%  Related Problem: Learn and implement coping skills that result in a reduction of anxiety and worry, and improved daily functioning. Description: Learn and implement problem-solving strategies for realistically addressing worries. Target Date: 2021-10-20 Frequency: Biweekly Modality: individual Progress: 20%  Related Problem: Learn and implement coping skills that result in a reduction of anxiety and worry, and improved daily  functioning. Description: Identify, challenge, and replace biased, fearful self-talk with positive, realistic, and empowering self-talk. Target Date: 2021-10-20 Frequency: Biweekly Modality: individual Progress: 20%  Related Problem: Learn and implement coping skills that result in a reduction of anxiety and worry, and improved daily functioning. Description: Verbalize an understanding of the role that cognitive biases play in excessive irrational worry and persistent anxiety symptoms. Target Date: 2021-10-20 Frequency: Biweekly Modality: individual Progress: 20%  Related Problem: Learn and implement coping skills that result in a reduction of anxiety and worry, and improved daily functioning. Description: Learn and implement calming skills to reduce overall anxiety and manage anxiety symptoms. Target Date: 2021-10-20 Frequency: Biweekly Modality: individual Progress: 30%  Related Problem: Learn and implement coping skills that result in a reduction of anxiety and worry, and improved daily functioning. Description: Reestablish a consistent sleep-wake cycle. Target Date: 2020-10-20 Frequency: Biweekly Modality: individual Progress: 90%  Related Problem: Learn  and implement coping skills that result in a reduction of anxiety and worry, and improved daily functioning. Description: Learn to accept limitations in life and commit to tolerating, rather than avoiding, unpleasant emotions while accomplishing meaningful goals. Target Date: 2021-10-20 Frequency: Biweekly Modality: individual Progress: 30%  Related Problem: Learn and implement coping skills that result in a reduction of anxiety and worry, and improved daily functioning. Description: Learn and implement relapse prevention strategies for managing possible future anxiety symptoms. Target Date: 2021-10-20 Frequency: Biweekly Modality: individual Progress: 0%  Related Problem: Learn and implement coping skills that result in  a reduction of anxiety and worry, and improved daily functioning. Description: Learn and implement personal and interpersonal skills to reduce anxiety and improve interpersonal relationships. Target Date: 2021-10-20 Frequency: Biweekly Modality: individual Progress: 10%  Related Problem: Sustain attention and concentration for consistently longer periods of time. Description: Combine skills learned in therapy into a new daily approach to managing ADHD. Target Date: 2021-10-20 Frequency: Biweekly Modality: individual Progress: 30%  Related Problem: Sustain attention and concentration for consistently longer periods of time. Description: Learn and implement skills to reduce procrastination. Target Date: 2021-10-20 Frequency: Biweekly Modality: individual Progress: 10%  Related Problem: Sustain attention and concentration for consistently longer periods of time. Description: Learn and implement skills to reduce the disruptive influence of distractibility. Target Date: 2021-10-20 Frequency: Biweekly Modality: individual Progress: 0% Planned Intervention: Teach the client stimulus control techniques that use external structure (e.g., lists, reminders, files, daily rituals) to improve on-task behavior; remove distracting stimuli in the environment; encourage the client to reward himself/herself for successful focus and follow-through.  Related Problem: Sustain attention and concentration for consistently longer periods of time. Description: Increase knowledge of ADHD and its treatment. Target Date: 2021-10-20 Frequency: Biweekly Modality: individual Progress: 20%  Related Problem: Sustain attention and concentration for consistently longer periods of time. Description: List coping skills that will be used to manage ADD symptoms. Target Date: 2021-10-20 Frequency: Biweekly Modality: individual Progress: 20%  Client Response full compliance  Service Location Location, [address] [place], [place] [address]  Service Code cpt [phone])   Interventions: Provide education, information   Identify automatic thoughts  Rationally challenge thoughts or beliefs/cognitive restructuring  Lifestyle change (exercise, nutrition)  Self care activities  Validate/empathize  Identify/label emotions  Grief and loss work Comments   Today I met with  [name] in remote video (WebEx) face-to-face individual psychotherapy as an accomodation to the COVID-19 Pandemic.  Distance Site: Client's Home Orginating Site: Dr Jannifer Franklin Remote Office Consent: Obtained verbal consent to transmit  session remotely    [name] reports that he has been sleeping better.  He realized that he has been drinking a lot of sweet tea.  He stopped drink tea and he sleep improved.  He asked a number of questions abut caffeine and its effects which I answered.  [name] has also continued to limit the hours he is watching tv and has more time to do other things.  [name] states that he is feeling more depressed at night and asked if this was "normal."  I made some inquires and we identified that he is especially missing his late night talks with his father.  After we d/ his feelings of grief, we d/e ways to fill this need to connect with others and fill "the void."     Progress: Patient is maintaining a low to moderate level of anxiety aeb less frequent episodes of over thinking and worry about medical issues. Pt's  mother is an ongoing source of worry, increased stress and depression.   Royetta Crochet, PhD

## 2021-08-08 ENCOUNTER — Ambulatory Visit: Payer: Medicare HMO | Admitting: Psychology

## 2021-08-22 ENCOUNTER — Ambulatory Visit (INDEPENDENT_AMBULATORY_CARE_PROVIDER_SITE_OTHER): Payer: Medicare HMO | Admitting: Psychology

## 2021-08-22 DIAGNOSIS — F411 Generalized anxiety disorder: Secondary | ICD-10-CM | POA: Diagnosis not present

## 2021-08-22 NOTE — Progress Notes (Signed)
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 (Status: maintained) -- No Description Entered  Autonomic hyperactivity (e.g., palpitations, shortness of breath, dry mouth, trouble swallowing, nausea, diarrhea). (Status: maintained) -- No Description Entered  Childhood history of Attention Deficit Disorder (ADD) that was either diagnosed or later concluded due to the symptoms of behavioral problems at school, impulsivity, temper outbursts, and lack of concentration. (Status: maintained) -- No Description Entered  Depressed or irritable mood. (Status: maintained) -- No Description Entered  Diminished interest in or enjoyment of activities. (Status: maintained) -- No Description Entered  Disorganized in most areas of his/her life. (Status: maintained) -- No Description Entered  Easily distracted and drawn from task at hand. (Status: maintained) -- No Description Entered  Excessive and/or unrealistic worry that is difficult to control occurring more days than not for at least 6 months about a number of events or activities. (Status: maintained) -- No Description Entered  Feelings of hopelessness, worthlessness, or inappropriate guilt. (Status: maintained) -- No Description Entered  Lack of energy. (Status: maintained) -- No Description Entered  Low self-esteem. (Status: maintained) -- No Description Entered  Psychological or behavioral factors that influence the course of the medical condition. (Status: maintained) -- No Description Entered  Psychomotor agitation or retardation. (Status: maintained) -- No Description Entered  Sleeplessness or hypersomnia. (Status: maintained) -- No  Description Entered  Social withdrawal. (Status: maintained) -- No Description Entered  Unable to concentrate or pay attention to things of low interest, even when those things are important to his/her life. (Status: maintained) -- No Description Entered  Medication Status compliance  Safety none  If Suicidal or Homicidal State Action Taken: unspecified  Current Risk: low Medications unspecified Objectives Related Problem: Accept the role of psychological or behavioral factors in development of the medical condition and focus on resolution of these factors. Description: Identify feelings associated with the medical condition. Target Date: 2021-10-20 Frequency: Biweekly Modality: individual Progress: 60%  Related Problem: Accept the role of psychological or behavioral factors in development of the medical condition and focus on resolution of these factors. Description: Learn and implement skills for managing stress. Target Date: 2021-10-20 Frequency: Biweekly Modality: individual Progress: 30%  Related Problem: Accept the role of psychological or behavioral factors in development of the medical condition and focus on resolution of these factors. Description: Identify sources of emotional distress that could have a negative impact on physical health. Target Date: 2021-10-20 Frequency: Biweekly Modality: individual Progress: 30%  Related Problem: Accept the role of psychological or behavioral factors in development of the medical condition and focus on resolution of these factors. Description: Engage in faith-based activities as a source of comfort and hope. Target Date: 2021-10-20 Frequency: Biweekly Modality: individual Progress: 60%  Related Problem: Accept the role of psychological or behavioral factors in development of the medical condition and focus on resolution of these factors. Description: Engage in social, productive, and recreational activities that are possible in  spite of medical condition. Target Date: 2021-10-20 Frequency: Biweekly Modality: individual Progress: 20%  Related Problem: Accept the role of psychological or behavioral factors in development of the medical condition and focus on resolution of these factors. Description: Comply with the medication regimen and necessary medical procedures, reporting any side effects or problems to physicians or therapists. Target Date:  2021-10-20 Frequency: Biweekly Modality: individual Progress: 100%  Related Problem: Accept the role of psychological or behavioral factors in development of the medical condition and focus on resolution of these factors. Description: Commit to learning and implementing a proactive approach to managing personal stresses introduced by the medical condition/diagnosis. Target Date: 2021-10-20 Frequency: Biweekly Modality: individual Progress: 30%  Related Problem: Develop healthy interpersonal relationships that lead to the alleviation and help prevent the relapse of depression. Description: Learn and implement relapse prevention skills. Target Date: 2021-10-20 Frequency: Biweekly Modality: individual Progress: 0%  Related Problem: Develop healthy interpersonal relationships that lead to the alleviation and help prevent the relapse of depression. Description: Learn and implement conflict resolution skills to resolve interpersonal problems. Target Date: 2021-10-20 Frequency: Biweekly Modality: individual Progress: 10%  Related Problem: Develop healthy interpersonal relationships that lead to the alleviation and help prevent the relapse of depression. Description: Learn and implement problem-solving and decision-making skills. Target Date: 2021-10-20 Frequency: Biweekly Modality: individual Progress: 10%  Related Problem: Develop healthy interpersonal relationships that lead to the alleviation and help prevent the relapse of depression. Description: Identify and  replace thoughts and beliefs that support depression. Target Date: 2021-10-20 Frequency: Biweekly Modality: individual Progress: 20%  Related Problem: Develop healthy interpersonal relationships that lead to the alleviation and help prevent the relapse of depression. Description: Learn and implement behavioral strategies to overcome depression. Target Date: 2021-10-20 Frequency: Biweekly Modality: individual Progress: 10%  Related Problem: Develop healthy interpersonal relationships that lead to the alleviation and help prevent the relapse of depression. Description: Increasingly verbalize hopeful and positive statements regarding self, others, and the future. Target Date: 2021-10-20 Frequency: Biweekly Modality: individual Progress: 30%  Related Problem: Learn and implement coping skills that result in a reduction of anxiety and worry, and improved daily functioning. Description: Cooperate with a medication evaluation by a physician. Target Date: 2021-10-20 Frequency: Biweekly Modality: individual Progress: 100% Planned Intervention: Monitor the client's psychotropic medication compliance, side effects, and effectiveness; confer regularly with the physician.  Related Problem: Learn and implement coping skills that result in a reduction of anxiety and worry, and improved daily functioning. Description: Identify and engage in pleasant activities on a daily basis. Target Date: 2021-10-20 Frequency: Biweekly Modality: individual Progress: 30%  Related Problem: Learn and implement coping skills that result in a reduction of anxiety and worry, and improved daily functioning. Description: Learn and implement problem-solving strategies for realistically addressing worries. Target Date: 2021-10-20 Frequency: Biweekly Modality: individual Progress: 20%  Related Problem: Learn and implement coping skills that result in a reduction of anxiety and worry, and improved daily  functioning. Description: Identify, challenge, and replace biased, fearful self-talk with positive, realistic, and empowering self-talk. Target Date: 2021-10-20 Frequency: Biweekly Modality: individual Progress: 20%  Related Problem: Learn and implement coping skills that result in a reduction of anxiety and worry, and improved daily functioning. Description: Verbalize an understanding of the role that cognitive biases play in excessive irrational worry and persistent anxiety symptoms. Target Date: 2021-10-20 Frequency: Biweekly Modality: individual Progress: 20%  Related Problem: Learn and implement coping skills that result in a reduction of anxiety and worry, and improved daily functioning. Description: Learn and implement calming skills to reduce overall anxiety and manage anxiety symptoms. Target Date: 2021-10-20 Frequency: Biweekly Modality: individual Progress: 30%  Related Problem: Learn and implement coping skills that result in a reduction of anxiety and worry, and improved daily functioning. Description: Reestablish a consistent sleep-wake cycle. Target Date: 2020-10-20 Frequency: Biweekly Modality: individual Progress: 90%  Related Problem: Learn  and implement coping skills that result in a reduction of anxiety and worry, and improved daily functioning. Description: Learn to accept limitations in life and commit to tolerating, rather than avoiding, unpleasant emotions while accomplishing meaningful goals. Target Date: 2021-10-20 Frequency: Biweekly Modality: individual Progress: 30%  Related Problem: Learn and implement coping skills that result in a reduction of anxiety and worry, and improved daily functioning. Description: Learn and implement relapse prevention strategies for managing possible future anxiety symptoms. Target Date: 2021-10-20 Frequency: Biweekly Modality: individual Progress: 0%  Related Problem: Learn and implement coping skills that result in  a reduction of anxiety and worry, and improved daily functioning. Description: Learn and implement personal and interpersonal skills to reduce anxiety and improve interpersonal relationships. Target Date: 2021-10-20 Frequency: Biweekly Modality: individual Progress: 10%  Related Problem: Sustain attention and concentration for consistently longer periods of time. Description: Combine skills learned in therapy into a new daily approach to managing ADHD. Target Date: 2021-10-20 Frequency: Biweekly Modality: individual Progress: 30%  Related Problem: Sustain attention and concentration for consistently longer periods of time. Description: Learn and implement skills to reduce procrastination. Target Date: 2021-10-20 Frequency: Biweekly Modality: individual Progress: 10%  Related Problem: Sustain attention and concentration for consistently longer periods of time. Description: Learn and implement skills to reduce the disruptive influence of distractibility. Target Date: 2021-10-20 Frequency: Biweekly Modality: individual Progress: 0% Planned Intervention: Teach the client stimulus control techniques that use external structure (e.g., lists, reminders, files, daily rituals) to improve on-task behavior; remove distracting stimuli in the environment; encourage the client to reward himself/herself for successful focus and follow-through.  Related Problem: Sustain attention and concentration for consistently longer periods of time. Description: Increase knowledge of ADHD and its treatment. Target Date: 2021-10-20 Frequency: Biweekly Modality: individual Progress: 20%  Related Problem: Sustain attention and concentration for consistently longer periods of time. Description: List coping skills that will be used to manage ADD symptoms. Target Date: 2021-10-20 Frequency: Biweekly Modality: individual Progress: 20%  Client Response full compliance  Service Location Location, [address] [place], [place] [address]  Service Code cpt [phone] [id number])   Interventions: Provide education, information   Identify automatic thoughts  Rationally challenge thoughts or beliefs/cognitive restructuring  Lifestyle change (exercise, nutrition)  Self care activities  Validate/empathize  Identify/label emotions  Grief and loss work Comments   Today I met with  Mark Strong in remote video (WebEx) face-to-face individual psychotherapy as an accomodation to the COVID-19 Pandemic.  Distance Site: Client's Home Orginating Site: Dr Jannifer Franklin Remote Office Consent: Obtained verbal consent to transmit  session remotely    Mark Strong reports that he has been sleeping better.  He has implemented a number of suggestions I made to him to improve his sleep hygiene (ie, limit screen time, limit light sources, wind down routine, etc).  He states it has made a significant difference.  He states that he has been able to wean off his sleep aide and is very excited about the progress he's made.  Mark Strong has more energy to be active and work on [employer] home improvement projects around his house.  He is also making more life style changes that are improving both his physical and mental health.      Progress: Patient is maintaining a low to moderate level of anxiety aeb less frequent episodes of over thinking and worry about medical issues. Pt's mother is an ongoing source of worry, increased stress and depression.   Royetta Crochet, PhD

## 2021-09-05 ENCOUNTER — Ambulatory Visit (INDEPENDENT_AMBULATORY_CARE_PROVIDER_SITE_OTHER): Payer: Medicare HMO | Admitting: Psychology

## 2021-09-05 DIAGNOSIS — F411 Generalized anxiety disorder: Secondary | ICD-10-CM

## 2021-09-05 NOTE — Progress Notes (Signed)
PROGRESS NOTE ? ? ?Patient Name: Mark Strong  ?Date of Birth: 1971-05-22  ?MRN: [id number]  ?PCP: Wendie Agreste, MD  ? ?Date of Service: 09/05/2021 ?Start: 11:00 AM ?End:  11:55 AM ? ? 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 (Status: maintained) -- No Description Entered  ?Autonomic hyperactivity (e.g., palpitations, shortness of breath, dry mouth, trouble swallowing, nausea, diarrhea). (Status: maintained) -- No Description Entered  ?Childhood history of Attention Deficit Disorder (ADD) that was either diagnosed or later concluded due to the symptoms of behavioral problems at school, impulsivity, temper outbursts, and lack of concentration. (Status: maintained) -- No Description Entered  ?Depressed or irritable mood. (Status: maintained) -- No Description Entered  ?Diminished interest in or enjoyment of activities. (Status: maintained) -- No Description Entered  ?Disorganized in most areas of his/her life. (Status: maintained) -- No Description Entered  ?Easily distracted and drawn from task at hand. (Status: maintained) -- No Description Entered  ?Excessive and/or unrealistic worry that is difficult to control occurring more days than not for at least 6 months about a number of events or activities. (Status: maintained) -- No Description Entered  ?Feelings of hopelessness, worthlessness, or inappropriate guilt. (Status: maintained) -- No Description Entered  ?Lack of energy. (Status: maintained) -- No Description Entered  ?Low self-esteem. (Status: maintained) -- No Description Entered  ?Psychological or behavioral factors that influence the course of the medical condition. (Status: maintained) -- No Description Entered  ?Psychomotor  agitation or retardation. (Status: maintained) -- No Description Entered  ?Sleeplessness or hypersomnia. (Status: maintained) -- No Description Entered  ?Social withdrawal. (Status: maintained) -- No Description Entered  ?Unable to concentrate or pay attention to things of low interest, even when those things are important to his/her life. (Status: maintained) -- No Description Entered  ?Medication Status ?compliance  ?Safety ?none  ?If Suicidal or Homicidal State Action Taken: unspecified  ?Current Risk: low ?Medications ?unspecified ?Objectives ?Related Problem: Accept the role of psychological or behavioral factors in development of the medical condition and focus on resolution of these factors. ?Description: Identify feelings associated with the medical condition. ?Target Date: 2021-10-20 ?Frequency: Biweekly ?Modality: individual ?Progress: 60% ? ?Related Problem: Accept the role of psychological or behavioral factors in development of the medical condition and focus on resolution of these factors. ?Description: Learn and implement skills for managing stress. ?Target Date: 2021-10-20 ?Frequency: Biweekly ?Modality: individual ?Progress: 30% ? ?Related Problem: Accept the role of psychological or behavioral factors in development of the medical condition and focus on resolution of these factors. ?Description: Identify sources of emotional distress that could have a negative impact on physical health. ?Target Date: 2021-10-20 ?Frequency: Biweekly ?Modality: individual ?Progress: 30% ? ?Related Problem: Accept the role of psychological or behavioral factors in development of the medical condition and focus on resolution of these factors. ?Description: Engage in faith-based activities as a source of comfort and hope. ?Target Date: 2021-10-20 ?Frequency: Biweekly ?Modality: individual ?Progress: 60% ? ?Related Problem: Accept the role of psychological or behavioral factors in development of the medical condition and  focus on resolution of these factors. ?Description: Engage in social, productive, and recreational activities that are possible in spite of medical condition. ?Target Date: 2021-10-20 ?Frequency: Biweekly ?Modality: individual ?Progress: 20% ? ?Related Problem: Accept the role of psychological or behavioral factors in development of the medical condition and focus  on resolution of these factors. ?Description: Comply with the medication regimen and necessary medical procedures, reporting any side effects or problems to physicians or therapists. ?Target Date: 2021-10-20 ?Frequency: Biweekly ?Modality: individual ?Progress: 100% ? ?Related Problem: Accept the role of psychological or behavioral factors in development of the medical condition and focus on resolution of these factors. ?Description: Commit to learning and implementing a proactive approach to managing personal stresses introduced by the medical condition/diagnosis. ?Target Date: 2021-10-20 ?Frequency: Biweekly ?Modality: individual ?Progress: 30% ? ?Related Problem: Develop healthy interpersonal relationships that lead to the alleviation and help prevent the relapse of depression. ?Description: Learn and implement relapse prevention skills. ?Target Date: 2021-10-20 ?Frequency: Biweekly ?Modality: individual ?Progress: 0% ? ?Related Problem: Develop healthy interpersonal relationships that lead to the alleviation and help prevent the relapse of depression. ?Description: Learn and implement conflict resolution skills to resolve interpersonal problems. ?Target Date: 2021-10-20 ?Frequency: Biweekly ?Modality: individual ?Progress: 10% ? ?Related Problem: Develop healthy interpersonal relationships that lead to the alleviation and help prevent the relapse of depression. ?Description: Learn and implement problem-solving and decision-making skills. ?Target Date: 2021-10-20 ?Frequency: Biweekly ?Modality: individual ?Progress: 10% ? ?Related Problem: Develop  healthy interpersonal relationships that lead to the alleviation and help prevent the relapse of depression. ?Description: Identify and replace thoughts and beliefs that support depression. ?Target Date: 2021-10-20 ?Frequency: Biweekly ?Modality: individual ?Progress: 20% ? ?Related Problem: Develop healthy interpersonal relationships that lead to the alleviation and help prevent the relapse of depression. ?Description: Learn and implement behavioral strategies to overcome depression. ?Target Date: 2021-10-20 ?Frequency: Biweekly ?Modality: individual ?Progress: 10% ? ?Related Problem: Develop healthy interpersonal relationships that lead to the alleviation and help prevent the relapse of depression. ?Description: Increasingly verbalize hopeful and positive statements regarding self, others, and the future. ?Target Date: 2021-10-20 ?Frequency: Biweekly ?Modality: individual ?Progress: 30% ? ?Related Problem: Learn and implement coping skills that result in a reduction of anxiety and worry, and improved daily functioning. ?Description: Cooperate with a medication evaluation by a physician. ?Target Date: 2021-10-20 ?Frequency: Biweekly ?Modality: individual ?Progress: 100% ?Planned Intervention: Monitor the client's psychotropic medication compliance, side effects, and effectiveness; confer regularly with the physician.  ?Related Problem: Learn and implement coping skills that result in a reduction of anxiety and worry, and improved daily functioning. ?Description: Identify and engage in pleasant activities on a daily basis. ?Target Date: 2021-10-20 ?Frequency: Biweekly ?Modality: individual ?Progress: 30% ? ?Related Problem: Learn and implement coping skills that result in a reduction of anxiety and worry, and improved daily functioning. ?Description: Learn and implement problem-solving strategies for realistically addressing worries. ?Target Date: 2021-10-20 ?Frequency: Biweekly ?Modality: individual ?Progress:  20% ? ?Related Problem: Learn and implement coping skills that result in a reduction of anxiety and worry, and improved daily functioning. ?Description: Identify, challenge, and replace biased, fearful self-talk w

## 2021-09-19 ENCOUNTER — Ambulatory Visit: Payer: Medicare HMO | Admitting: Psychology

## 2021-10-10 ENCOUNTER — Ambulatory Visit (INDEPENDENT_AMBULATORY_CARE_PROVIDER_SITE_OTHER): Payer: Medicare HMO | Admitting: Psychology

## 2021-10-10 DIAGNOSIS — F3341 Major depressive disorder, recurrent, in partial remission: Secondary | ICD-10-CM | POA: Diagnosis not present

## 2021-10-10 DIAGNOSIS — F411 Generalized anxiety disorder: Secondary | ICD-10-CM | POA: Diagnosis not present

## 2021-10-10 NOTE — Progress Notes (Signed)
PROGRESS NOTE ? ? ?Patient Name: Mark Strong  ?Date of Birth: Dec 02, 1970  ?MRN: [id number]  ?PCP: [name], MD  ? ?Date of Service: 10/10/2021 ?Start: 11:00 AM ?End:  11:55 AM ? ? 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 (Status: maintained) -- No Description Entered  ?Autonomic hyperactivity (e.g., palpitations, shortness of breath, dry mouth, trouble swallowing, nausea, diarrhea). (Status: maintained) -- No Description Entered  ?Childhood history of Attention Deficit Disorder (ADD) that was either diagnosed or later concluded due to the symptoms of behavioral problems at school, impulsivity, temper outbursts, and lack of concentration. (Status: maintained) -- No Description Entered  ?Depressed or irritable mood. (Status: maintained) -- No Description Entered  ?Diminished interest in or enjoyment of activities. (Status: maintained) -- No Description Entered  ?Disorganized in most areas of his/her life. (Status: maintained) -- No Description Entered  ?Easily distracted and drawn from task at hand. (Status: maintained) -- No Description Entered  ?Excessive and/or unrealistic worry that is difficult to control occurring more days than not for at least 6 months about a number of events or activities. (Status: maintained) -- No Description Entered  ?Feelings of hopelessness, worthlessness, or inappropriate guilt. (Status: maintained) -- No Description Entered  ?Lack of energy. (Status: maintained) -- No Description Entered  ?Low self-esteem. (Status: maintained) -- No Description Entered  ?Psychological or behavioral factors that influence the course of the medical condition. (Status: maintained) -- No Description Entered  ?Psychomotor  agitation or retardation. (Status: maintained) -- No Description Entered  ?Sleeplessness or hypersomnia. (Status: maintained) -- No Description Entered  ?Social withdrawal. (Status: maintained) -- No Description Entered  ?Unable to concentrate or pay attention to things of low interest, even when those things are important to his/her life. (Status: maintained) -- No Description Entered  ?Medication Status ?compliance  ?Safety ?none  ?If Suicidal or Homicidal State Action Taken: unspecified  ?Current Risk: low ?Medications ?unspecified ?Objectives ?Related Problem: Accept the role of psychological or behavioral factors in development of the medical condition and focus on resolution of these factors. ?Description: Identify feelings associated with the medical condition. ?Target Date: 2021-10-20 ?Frequency: Biweekly ?Modality: individual ?Progress: 60% ? ?Related Problem: Accept the role of psychological or behavioral factors in development of the medical condition and focus on resolution of these factors. ?Description: Learn and implement skills for managing stress. ?Target Date: 2021-10-20 ?Frequency: Biweekly ?Modality: individual ?Progress: 30% ? ?Related Problem: Accept the role of psychological or behavioral factors in development of the medical condition and focus on resolution of these factors. ?Description: Identify sources of emotional distress that could have a negative impact on physical health. ?Target Date: 2021-10-20 ?Frequency: Biweekly ?Modality: individual ?Progress: 30% ? ?Related Problem: Accept the role of psychological or behavioral factors in development of the medical condition and focus on resolution of these factors. ?Description: Engage in faith-based activities as a source of comfort and hope. ?Target Date: 2021-10-20 ?Frequency: Biweekly ?Modality: individual ?Progress: 60% ? ?Related Problem: Accept the role of psychological or behavioral factors in development of the medical condition and  focus on resolution of these factors. ?Description: Engage in social, productive, and recreational activities that are possible in spite of medical condition. ?Target Date: 2021-10-20 ?Frequency: Biweekly ?Modality: individual ?Progress: 20% ? ?Related Problem: Accept the role of psychological or behavioral factors in development of the medical condition and focus  on resolution of these factors. ?Description: Comply with the medication regimen and necessary medical procedures, reporting any side effects or problems to physicians or therapists. ?Target Date: 2021-10-20 ?Frequency: Biweekly ?Modality: individual ?Progress: 100% ? ?Related Problem: Accept the role of psychological or behavioral factors in development of the medical condition and focus on resolution of these factors. ?Description: Commit to learning and implementing a proactive approach to managing personal stresses introduced by the medical condition/diagnosis. ?Target Date: 2021-10-20 ?Frequency: Biweekly ?Modality: individual ?Progress: 30% ? ?Related Problem: Develop healthy interpersonal relationships that lead to the alleviation and help prevent the relapse of depression. ?Description: Learn and implement relapse prevention skills. ?Target Date: 2021-10-20 ?Frequency: Biweekly ?Modality: individual ?Progress: 0% ? ?Related Problem: Develop healthy interpersonal relationships that lead to the alleviation and help prevent the relapse of depression. ?Description: Learn and implement conflict resolution skills to resolve interpersonal problems. ?Target Date: 2021-10-20 ?Frequency: Biweekly ?Modality: individual ?Progress: 10% ? ?Related Problem: Develop healthy interpersonal relationships that lead to the alleviation and help prevent the relapse of depression. ?Description: Learn and implement problem-solving and decision-making skills. ?Target Date: 2021-10-20 ?Frequency: Biweekly ?Modality: individual ?Progress: 10% ? ?Related Problem: Develop  healthy interpersonal relationships that lead to the alleviation and help prevent the relapse of depression. ?Description: Identify and replace thoughts and beliefs that support depression. ?Target Date: 2021-10-20 ?Frequency: Biweekly ?Modality: individual ?Progress: 20% ? ?Related Problem: Develop healthy interpersonal relationships that lead to the alleviation and help prevent the relapse of depression. ?Description: Learn and implement behavioral strategies to overcome depression. ?Target Date: 2021-10-20 ?Frequency: Biweekly ?Modality: individual ?Progress: 10% ? ?Related Problem: Develop healthy interpersonal relationships that lead to the alleviation and help prevent the relapse of depression. ?Description: Increasingly verbalize hopeful and positive statements regarding self, others, and the future. ?Target Date: 2021-10-20 ?Frequency: Biweekly ?Modality: individual ?Progress: 30% ? ?Related Problem: Learn and implement coping skills that result in a reduction of anxiety and worry, and improved daily functioning. ?Description: Cooperate with a medication evaluation by a physician. ?Target Date: 2021-10-20 ?Frequency: Biweekly ?Modality: individual ?Progress: 100% ?Planned Intervention: Monitor the client's psychotropic medication compliance, side effects, and effectiveness; confer regularly with the physician.  ?Related Problem: Learn and implement coping skills that result in a reduction of anxiety and worry, and improved daily functioning. ?Description: Identify and engage in pleasant activities on a daily basis. ?Target Date: 2021-10-20 ?Frequency: Biweekly ?Modality: individual ?Progress: 30% ? ?Related Problem: Learn and implement coping skills that result in a reduction of anxiety and worry, and improved daily functioning. ?Description: Learn and implement problem-solving strategies for realistically addressing worries. ?Target Date: 2021-10-20 ?Frequency: Biweekly ?Modality: individual ?Progress:  20% ? ?Related Problem: Learn and implement coping skills that result in a reduction of anxiety and worry, and improved daily functioning. ?Description: Identify, challenge, and replace biased, fearful self-talk w

## 2021-10-17 ENCOUNTER — Ambulatory Visit: Payer: Medicare HMO | Admitting: Psychology

## 2021-11-07 ENCOUNTER — Ambulatory Visit (INDEPENDENT_AMBULATORY_CARE_PROVIDER_SITE_OTHER): Payer: Medicare HMO | Admitting: Psychology

## 2021-11-07 DIAGNOSIS — F900 Attention-deficit hyperactivity disorder, predominantly inattentive type: Secondary | ICD-10-CM

## 2021-11-07 NOTE — Progress Notes (Signed)
Patient Name: Mark Strong  Date of Birth: Sep 07, 1970  MRN: [id number]  PCP: Wendie Agreste, MD   Date of Service: 11/07/2021 Start: 11:00 AM End:  11:55 AM  Today I met with  Mark Strong in remote video (WebEx) face-to-face individual psychotherapy   Distance Site: Client's Home Orginating Site: Dr Jannifer Franklin Remote Office Consent: Obtained verbal consent to transmit  session remotely   In session today, we d/ treatment progress, reviewed goals and updated pt's treatment plan.  Mark Strong participated in the creation of his treatment plan and gave his consent.   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 2) Learn and implement coping skills that result in a reduction of anxiety and worry, and improved daily functioning. 3)  Engage in social, productive, and recreational activities that are possible in spite of medical condition. 4) Comply with the medication regimen and necessary medical procedures, reporting any side effects or problems to physicians or therapists.  5) Learn and implement coping skills to reduce the disruptive influence of distractibility  and forgetfulness to improved daily functioning.        Client Statement of Needs: states he needs assistance with his depression, anxiety and ADHD.     Treatment Level: Bi-weekly Individual Outpatient Psychotherapy  Symptoms: A diagnosis of a chronic illness that is not life-threatening, but necessitates changes in living.  Autonomic hyperactivity  (e.g., palpitations, shortness of breath, dry mouth, trouble swallowing, nausea, diarrhea).   Childhood history of Attention Deficit Disorder (ADD) that was either diagnosed or later concluded due to the symptoms of behavioral problems at school, impulsivity, temper outbursts, and lack of concentration.  Depressed or irritable mood.   Diminished interest in or enjoyment of activities.  Disorganized in most areas of his/her life.  Easily distracted and drawn from task at hand.  Excessive and/or unrealistic worry that is difficult to control occurring more days than not for at least 6 months about a number of events or activities. Feelings of hopelessness, worthlessness, or inappropriate guilt. Lack of energy.  Low self-esteem.  Psychological or behavioral factors that influence the course of the medical condition.  Psychomotor agitation or retardation.   Sleeplessness or hypersomnia.   Social withdrawal.   Unable to concentrate or pay attention to things of low interest, even when those things are important to his/her life.   Client Treatment Preferences: Continue with present therapist   Healthcare consumer's goal for treatment:  Psychologist, Royetta Crochet, PhD will support the patient's ability to achieve the goals identified. Cognitive Behavioral Therapy, Dialectical Behavioral Therapy, Motivational Interviewing, organizational and time management skills, and other evidenced-based practices will be used to promote progress towards healthy functioning.   Healthcare consumer Mark Strong will: Actively participate in therapy, working towards healthy functioning.    *Justification for Continuation/Discontinuation of Goal: R=Revised, O=Ongoing, A=Achieved, D=Discontinued  Goal 1) Learn and implement relapse prevention skills that lead to the alleviation and help prevent the relapse of depression. . 5 Point Likert rating baseline date: 10/20/2021  Target Date Goal Was reviewed  Status Code Progress towards goal/Likert rating  11/08/2022 11/07/2021  O 3/5 - pt has introduced new lifestyle known to reduce depression             Goal 2) Learn and implement coping skills that result in a reduction of anxiety and worry, and improved daily functioning.     5 Point Likert rating baseline date: 10/20/2021  Target Date Goal Was reviewed Status Code Progress towards goal/Likert rating  11/08/2022 11/07/2021          O 2/5 - pt implements coping skills inconsistently             Goal 3) Engage in social, productive, and recreational activities that are possible in spite of medical condition. 5 Point Likert rating baseline date: 10/20/2021  Target Date Goal Was reviewed Status Code Progress towards goal/Likert rating  11/08/2022 11/07/2021          O 2/5 - pt is making connections with neighbors and periodically attend church/church events             Goal 4) Comply with the medication regimen and necessary medical procedures, reporting any side effects or problems to physicians or therapists.  5 Point Likert rating baseline date: 10/20/2021   Target Date Goal Was reviewed Status Code Progress towards goal/Likert rating  11/08/2022 11/07/2021           O 5/5 - pt is compliant in taking medications as prescribed              Goal  5 Point Likert rating baseline date: 10/20/2021  Target Date Goal Was reviewed Status Code Progress towards goal/Likert rating  11/08/2022 11/07/2021          O 2/5 - pt inconsistently implements skills known to reduce distractibility and forgetfulness               This plan has been reviewed and created by the following participants:  This plan will be reviewed at least every 12 months. Date Behavioral Health Clinician Date Guardian/Patient   11/07/2021 Royetta Crochet, PhD  11/07/2021 Caralyn Guile, PhD

## 2021-11-14 ENCOUNTER — Ambulatory Visit: Payer: Medicare HMO | Admitting: Psychology

## 2021-12-12 ENCOUNTER — Ambulatory Visit (INDEPENDENT_AMBULATORY_CARE_PROVIDER_SITE_OTHER): Payer: Medicare HMO | Admitting: Psychology

## 2021-12-12 DIAGNOSIS — F900 Attention-deficit hyperactivity disorder, predominantly inattentive type: Secondary | ICD-10-CM

## 2021-12-12 NOTE — Progress Notes (Signed)
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 2) Learn and implement coping skills that result in a reduction of anxiety and worry, and improved daily functioning. 3)  Engage in social, productive, and recreational activities that are possible in spite of medical condition. 4) Comply with the medication regimen and necessary medical procedures, reporting any side effects or problems to physicians or therapists.  5) Learn and implement coping skills to reduce the disruptive influence of distractibility  and forgetfulness to improved daily functioning.        Client Statement of Needs: states he needs assistance with his depression, anxiety and ADHD.     Treatment Level: Bi-weekly Individual Outpatient Psychotherapy  Symptoms: A diagnosis of a chronic illness that is not life-threatening, but necessitates changes in living.  Autonomic hyperactivity (e.g., palpitations, shortness of breath, dry mouth, trouble swallowing, nausea, diarrhea).   Childhood history of Attention Deficit Disorder (ADD) that was either diagnosed  or later concluded due to the symptoms of behavioral problems at school, impulsivity, temper outbursts, and lack of concentration.  Depressed or irritable mood.   Diminished interest in or enjoyment of activities.  Disorganized in most areas of his/her life.  Easily distracted and drawn from task at hand.  Excessive and/or unrealistic worry that is difficult to control occurring more days than not for at least 6 months about a number of events or activities. Feelings of hopelessness, worthlessness, or inappropriate guilt. Lack of energy.  Low self-esteem.  Psychological or behavioral factors that influence the course of the medical condition.  Psychomotor agitation or retardation.   Sleeplessness or hypersomnia.   Social withdrawal.   Unable to concentrate or pay attention to things of low interest, even when those things are important to his/her life.   Client Treatment Preferences: Continue with present therapist   Healthcare consumer's goal for treatment:  Psychologist, Hilma Favors, PhD will support the patient's ability to achieve the goals identified. Cognitive Behavioral Therapy, Dialectical Behavioral Therapy, Motivational Interviewing, organizational and time management skills, and other evidenced-based practices will be used to promote progress towards healthy functioning.   Healthcare consumer Jahree Bleeker will: Actively participate in therapy, working towards healthy functioning.    *Justification for Continuation/Discontinuation of Goal: R=Revised, O=Ongoing, A=Achieved, D=Discontinued  Goal 1) Learn and implement relapse prevention skills that lead to the alleviation and help prevent the relapse of depression. . 5 Point Likert rating baseline date: 10/20/2021  Target Date Goal Was reviewed Status Code Progress towards goal/Likert rating  11/08/2022 11/07/2021          O 3/5 - pt has introduced new lifestyle known to reduce depression  Goal 2) Learn and  implement coping skills that result in a reduction of anxiety and worry, and improved daily functioning.     5 Point Likert rating baseline date: 10/20/2021  Target Date Goal Was reviewed Status Code Progress towards goal/Likert rating  11/08/2022 11/07/2021          O 2/5 - pt implements coping skills inconsistently             Goal 3) Engage in social, productive, and recreational activities that are possible in spite of medical condition. 5 Point Likert rating baseline date: 10/20/2021  Target Date Goal Was reviewed Status Code Progress towards goal/Likert rating  11/08/2022 11/07/2021          O 2/5 - pt is making connections with neighbors and periodically attend church/church events             Goal 4) Comply with the medication regimen and necessary medical procedures, reporting any side effects or problems to physicians or therapists.  5 Point Likert rating baseline date: 10/20/2021   Target Date Goal Was reviewed Status Code Progress towards goal/Likert rating  11/08/2022 11/07/2021           O 5/5 - pt is compliant in taking medications as prescribed              Goal  5 Point Likert rating baseline date: 10/20/2021  Target Date Goal Was reviewed Status Code Progress towards goal/Likert rating  11/08/2022 11/07/2021          O 2/5 - pt inconsistently implements skills known to reduce distractibility and forgetfulness               This plan has been reviewed and created by the following participants:  This plan will be reviewed at least every 12 months. Date Behavioral Health Clinician Date Guardian/Patient   11/07/2021 Hilma Favors, PhD  11/07/2021 Roselind Rily reports that he met with his PCP and his blood work numbers all look good.  We d/ that all the lifestyle changes he's made have made a big difference on his physical health and mental health.  After completing ADHD testing, he f/u with his provider who prescribed Vyvanse.  Due to the supply  shortage he hasn't been able to start until this week.  [name] shared that he has decided to sell his house and move back to [place].  We d/e/p his decision and the timing of this move.    Hilma Favors, PhD

## 2022-01-09 ENCOUNTER — Ambulatory Visit (INDEPENDENT_AMBULATORY_CARE_PROVIDER_SITE_OTHER): Payer: Medicare HMO | Admitting: Psychology

## 2022-01-09 DIAGNOSIS — F411 Generalized anxiety disorder: Secondary | ICD-10-CM

## 2022-01-09 NOTE — Progress Notes (Signed)
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 2) Learn and implement coping skills that result in a reduction of anxiety and worry, and improved daily functioning. 3)  Engage in social, productive, and recreational activities that are possible in spite of medical condition. 4) Comply with the medication regimen and necessary medical procedures, reporting any side effects or problems to physicians or therapists.  5) Learn and implement coping skills to reduce the disruptive influence of distractibility  and forgetfulness to improved daily functioning.        Client Statement of Needs: states he needs assistance with his depression, anxiety and ADHD.     Treatment Level: Bi-weekly Individual Outpatient Psychotherapy  Symptoms: A diagnosis of a chronic illness that is not life-threatening, but necessitates changes in living.  Autonomic hyperactivity (e.g., palpitations, shortness of breath, dry mouth, trouble swallowing, nausea, diarrhea).   Childhood history of Attention Deficit Disorder (ADD) that  was either diagnosed or later concluded due to the symptoms of behavioral problems at school, impulsivity, temper outbursts, and lack of concentration.  Depressed or irritable mood.   Diminished interest in or enjoyment of activities.  Disorganized in most areas of his/her life.  Easily distracted and drawn from task at hand.  Excessive and/or unrealistic worry that is difficult to control occurring more days than not for at least 6 months about a number of events or activities. Feelings of hopelessness, worthlessness, or inappropriate guilt. Lack of energy.  Low self-esteem.  Psychological or behavioral factors that influence the course of the medical condition.  Psychomotor agitation or retardation.   Sleeplessness or hypersomnia.   Social withdrawal.   Unable to concentrate or pay attention to things of low interest, even when those things are important to his/her life.   Client Treatment Preferences: Continue with present therapist   Healthcare consumer's goal for treatment:  Psychologist, Royetta Crochet, PhD will support the patient's ability to achieve the goals identified. Cognitive Behavioral Therapy, Dialectical Behavioral Therapy, Motivational Interviewing, organizational and time management skills, and other evidenced-based practices will be used to promote progress towards healthy functioning.   Healthcare consumer Sadik Piascik will: Actively participate in therapy, working towards healthy functioning.    *Justification for Continuation/Discontinuation of Goal: R=Revised, O=Ongoing, A=Achieved, D=Discontinued  Goal 1) Learn and implement relapse prevention skills that lead to the alleviation and help prevent the relapse of depression. . 5 Point Likert rating baseline date: 10/20/2021  Target Date Goal Was reviewed Status Code Progress towards goal/Likert rating  11/08/2022 11/07/2021          O 3/5 - pt has introduced new lifestyle known to reduce depression  Goal 2) Learn and implement coping skills that result in a reduction of anxiety and worry, and improved daily functioning.     5 Point Likert rating baseline date: 10/20/2021  Target Date Goal Was reviewed Status Code Progress towards goal/Likert rating  11/08/2022 11/07/2021           O 2/5 - pt implements coping skills inconsistently             Goal 3) Engage in social, productive, and recreational activities that are possible in spite of medical condition. 5 Point Likert rating baseline date: 10/20/2021  Target Date Goal Was reviewed Status Code Progress towards goal/Likert rating  11/08/2022 11/07/2021          O 2/5 - pt is making connections with neighbors and periodically attend church/church events             Goal 4) Comply with the medication regimen and necessary medical procedures, reporting any side effects or problems to physicians or therapists.  5 Point Likert rating baseline date: 10/20/2021   Target Date Goal Was reviewed Status Code Progress towards goal/Likert rating  11/08/2022 11/07/2021           O 5/5 - pt is compliant in taking medications as prescribed              Goal  5 Point Likert rating baseline date: 10/20/2021  Target Date Goal Was reviewed Status Code Progress towards goal/Likert rating  11/08/2022 11/07/2021           O 2/5 - pt inconsistently implements skills known to reduce distractibility and forgetfulness               This plan has been reviewed and created by the following participants:  This plan will be reviewed at least every 12 months. Date Behavioral Health Clinician Date Guardian/Patient   11/07/2021 Royetta Crochet, PhD  11/07/2021 Arvilla Market reports that he will have his house on the market on Sunday.  [name] states that he has been working on [employer] around the house to prepare it [employer].  He has taught himself how to do some of small household repairs and was feeling very good about himself.  He  started on Ritalin 5 mg and feels like he might need a higher dose.  [name] is excited to be moving back to [place] and we d/ what that transition might look like.     Royetta Crochet, PhD

## 2022-01-25 ENCOUNTER — Ambulatory Visit (INDEPENDENT_AMBULATORY_CARE_PROVIDER_SITE_OTHER): Payer: Medicare HMO | Admitting: Psychology

## 2022-01-25 DIAGNOSIS — F411 Generalized anxiety disorder: Secondary | ICD-10-CM | POA: Diagnosis not present

## 2022-01-25 NOTE — Progress Notes (Signed)
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 2) Learn and implement coping skills that result in a reduction of anxiety and worry, and improved daily functioning. 3)  Engage in social, productive, and recreational activities that are possible in spite of medical condition. 4) Comply with the medication regimen and necessary medical procedures, reporting any side effects or problems to physicians or therapists.  5) Learn and implement coping skills to reduce the disruptive influence of distractibility  and forgetfulness to improved daily functioning.        Client Statement of Needs: states he needs assistance with his depression, anxiety and ADHD.     Treatment Level: Bi-weekly Individual Outpatient Psychotherapy  Symptoms: A diagnosis of a chronic illness that is not life-threatening, but necessitates changes in living.  Autonomic hyperactivity (e.g., palpitations, shortness of breath, dry mouth, trouble swallowing, nausea, diarrhea).   Childhood history of Attention Deficit Disorder (ADD)  that was either diagnosed or later concluded due to the symptoms of behavioral problems at school, impulsivity, temper outbursts, and lack of concentration.  Depressed or irritable mood.   Diminished interest in or enjoyment of activities.  Disorganized in most areas of his/her life.  Easily distracted and drawn from task at hand.  Excessive and/or unrealistic worry that is difficult to control occurring more days than not for at least 6 months about a number of events or activities. Feelings of hopelessness, worthlessness, or inappropriate guilt. Lack of energy.  Low self-esteem.  Psychological or behavioral factors that influence the course of the medical condition.  Psychomotor agitation or retardation.   Sleeplessness or hypersomnia.   Social withdrawal.   Unable to concentrate or pay attention to things of low interest, even when those things are important to his/her life.   Client Treatment Preferences: Continue with present therapist   Healthcare consumer's goal for treatment:  Psychologist, Royetta Crochet, PhD will support the patient's ability to achieve the goals identified. Cognitive Behavioral Therapy, Dialectical Behavioral Therapy, Motivational Interviewing, organizational and time management skills, and other evidenced-based practices will be used to promote progress towards healthy functioning.   Healthcare consumer Nahom Carfagno will: Actively participate in therapy, working towards healthy functioning.    *Justification for Continuation/Discontinuation of Goal: R=Revised, O=Ongoing, A=Achieved, D=Discontinued  Goal 1) Learn and implement relapse prevention skills that lead to the alleviation and help prevent the relapse of depression. . 5 Point Likert rating baseline date: 10/20/2021  Target Date Goal Was reviewed Status Code Progress towards goal/Likert rating  11/08/2022 11/07/2021          O 3/5 - pt has introduced new lifestyle known to reduce depression  Goal 2) Learn and implement coping skills that result in a reduction of anxiety and worry, and improved daily functioning.     5 Point Likert rating baseline date: 10/20/2021  Target Date Goal Was reviewed Status Code Progress towards goal/Likert rating  11/08/2022 11/07/2021           O 2/5 - pt implements coping skills inconsistently             Goal 3) Engage in social, productive, and recreational activities that are possible in spite of medical condition. 5 Point Likert rating baseline date: 10/20/2021  Target Date Goal Was reviewed Status Code Progress towards goal/Likert rating  11/08/2022 11/07/2021          O 2/5 - pt is making connections with neighbors and periodically attend church/church events             Goal 4) Comply with the medication regimen and necessary medical procedures, reporting any side effects or problems to physicians or therapists.  5 Point Likert rating baseline date: 10/20/2021   Target Date Goal Was reviewed Status Code Progress towards goal/Likert rating  11/08/2022 11/07/2021           O 5/5 - pt is compliant in taking medications as prescribed              Goal  5 Point Likert rating baseline date: 10/20/2021  Target Date Goal Was reviewed Status Code Progress towards goal/Likert rating  11/08/2022 11/07/2021           O 2/5 - pt inconsistently implements skills known to reduce distractibility and forgetfulness               This plan has been reviewed and created by the following participants:  This plan will be reviewed at least every 12 months. Date Behavioral Health Clinician Date Guardian/Patient   11/07/2021 Royetta Crochet, PhD  11/07/2021 [name]                   Depression Rating: 0-1  [name] reports that he officially has his house on the market.  He is trying to be patient with the process.  We d/ managing expectations and he seems to have a well grounded approach.    [name] reached out for an extra appointment this month  as has been rather anxious/concerned about the multiple changes in medication in a short period of time.  The Ritalin shortages have made so he has need to come up with other options.  However, he has been left rather confused about the medication and the reasons why he was prescribed particular alternative.  I addressed his confusion, answered all his questions and provided psycho education about non-stimulate and stimulant ADHD medications.  By the end of his session, he felt more at ease and confident in the changes that were made.   Royetta Crochet, PhD

## 2022-02-06 ENCOUNTER — Ambulatory Visit (INDEPENDENT_AMBULATORY_CARE_PROVIDER_SITE_OTHER): Payer: Medicare HMO | Admitting: Psychology

## 2022-02-06 DIAGNOSIS — F330 Major depressive disorder, recurrent, mild: Secondary | ICD-10-CM | POA: Diagnosis not present

## 2022-02-06 DIAGNOSIS — F411 Generalized anxiety disorder: Secondary | ICD-10-CM | POA: Diagnosis not present

## 2022-02-06 NOTE — Progress Notes (Signed)
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 2) Learn and implement coping skills that result in a reduction of anxiety and worry, and improved daily functioning. 3)  Engage in social, productive, and recreational activities that are possible in spite of medical condition. 4) Comply with the medication regimen and necessary medical procedures, reporting any side effects or problems to physicians or therapists.  5) Learn and implement coping skills to reduce the disruptive influence of distractibility  and forgetfulness to improved daily functioning.        Client Statement of Needs: states he needs assistance with his depression, anxiety and ADHD.     Treatment Level: Bi-weekly Individual Outpatient Psychotherapy  Symptoms: A diagnosis of a chronic illness that is not life-threatening, but necessitates changes in living.  Autonomic hyperactivity (e.g., palpitations, shortness of breath, dry mouth, trouble swallowing, nausea, diarrhea).   Childhood history of Attention Deficit Disorder (ADD) that  was either diagnosed or later concluded due to the symptoms of behavioral problems at school, impulsivity, temper outbursts, and lack of concentration.  Depressed or irritable mood.   Diminished interest in or enjoyment of activities.  Disorganized in most areas of his/her life.  Easily distracted and drawn from task at hand.  Excessive and/or unrealistic worry that is difficult to control occurring more days than not for at least 6 months about a number of events or activities. Feelings of hopelessness, worthlessness, or inappropriate guilt. Lack of energy.  Low self-esteem.  Psychological or behavioral factors that influence the course of the medical condition.  Psychomotor agitation or retardation.   Sleeplessness or hypersomnia.   Social withdrawal.   Unable to concentrate or pay attention to things of low interest, even when those things are important to his/her life.   Client Treatment Preferences: Continue with present therapist   Healthcare consumer's goal for treatment:  Psychologist, Royetta Crochet, PhD will support the patient's ability to achieve the goals identified. Cognitive Behavioral Therapy, Dialectical Behavioral Therapy, Motivational Interviewing, organizational and time management skills, and other evidenced-based practices will be used to promote progress towards healthy functioning.   Healthcare consumer Keefer Soulliere will: Actively participate in therapy, working towards healthy functioning.    *Justification for Continuation/Discontinuation of Goal: R=Revised, O=Ongoing, A=Achieved, D=Discontinued  Goal 1) Learn and implement relapse prevention skills that lead to the alleviation and help prevent the relapse of depression. . 5 Point Likert rating baseline date: 10/20/2021  Target Date Goal Was reviewed Status Code Progress towards goal/Likert rating  11/08/2022 11/07/2021          O 3/5 - pt has introduced new lifestyle known to reduce depression  Goal 2) Learn and implement coping skills that result in a reduction of anxiety and worry, and improved daily functioning.     5 Point Likert rating baseline date: 10/20/2021  Target Date Goal Was reviewed Status Code Progress towards goal/Likert rating  11/08/2022 11/07/2021           O 2/5 - pt implements coping skills inconsistently             Goal 3) Engage in social, productive, and recreational activities that are possible in spite of medical condition. 5 Point Likert rating baseline date: 10/20/2021  Target Date Goal Was reviewed Status Code Progress towards goal/Likert rating  11/08/2022 11/07/2021          O 2/5 - pt is making connections with neighbors and periodically attend church/church events             Goal 4) Comply with the medication regimen and necessary medical procedures, reporting any side effects or problems to physicians or therapists.  5 Point Likert rating baseline date: 10/20/2021   Target Date Goal Was reviewed Status Code Progress towards goal/Likert rating  11/08/2022 11/07/2021           O 5/5 - pt is compliant in taking medications as prescribed              Goal  5 Point Likert rating baseline date: 10/20/2021  Target Date Goal Was reviewed Status Code Progress towards goal/Likert rating  11/08/2022 11/07/2021           O 2/5 - pt inconsistently implements skills known to reduce distractibility and forgetfulness               This plan has been reviewed and created by the following participants:  This plan will be reviewed at least every 12 months. Date Behavioral Health Clinician Date Guardian/Patient   11/07/2021 Royetta Crochet, PhD  11/07/2021 [name]                   Depression Rating: 0-1  [name] reports that he has been feeling pretty down.  He was wondering if he would be a good candidate for [facility].  I provided psycho education about [place] and indicated that he wouldn't qualify nor was it necessary.  I reminded him that he was doing fine  until his new psychiatrist took him off Wellbutrin.  He spoke with his provider and was told to go back on the Wellbutrin if he continued to feel depressed.  I agreed with his decision to re-start the Wellbutrin.  [name] agreed to continue to monitor his mood.       Royetta Crochet, PhD

## 2022-03-06 ENCOUNTER — Ambulatory Visit (INDEPENDENT_AMBULATORY_CARE_PROVIDER_SITE_OTHER): Payer: Medicare HMO | Admitting: Psychology

## 2022-03-06 DIAGNOSIS — F411 Generalized anxiety disorder: Secondary | ICD-10-CM

## 2022-03-06 NOTE — Progress Notes (Signed)
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 2) Learn and implement coping skills that result in a reduction of anxiety and worry, and improved daily functioning. 3)  Engage in social, productive, and recreational activities that are possible in spite of medical condition. 4) Comply with the medication regimen and necessary medical procedures, reporting any side effects or problems to physicians or therapists.  5) Learn and implement coping skills to reduce the disruptive influence of distractibility  and forgetfulness to improved daily functioning.        Client Statement of Needs: states he needs assistance with his depression, anxiety and ADHD.     Treatment Level: Bi-weekly Individual Outpatient Psychotherapy  Symptoms: A diagnosis of a chronic illness that is not life-threatening, but necessitates changes in living.  Autonomic hyperactivity (e.g., palpitations, shortness of breath, dry mouth, trouble swallowing, nausea, diarrhea).   Childhood history of Attention Deficit Disorder (ADD) that was either diagnosed or  later concluded due to the symptoms of behavioral problems at school, impulsivity, temper outbursts, and lack of concentration.  Depressed or irritable mood.   Diminished interest in or enjoyment of activities.  Disorganized in most areas of his/her life.  Easily distracted and drawn from task at hand.  Excessive and/or unrealistic worry that is difficult to control occurring more days than not for at least 6 months about a number of events or activities. Feelings of hopelessness, worthlessness, or inappropriate guilt. Lack of energy.  Low self-esteem.  Psychological or behavioral factors that influence the course of the medical condition.  Psychomotor agitation or retardation.   Sleeplessness or hypersomnia.   Social withdrawal.   Unable to concentrate or pay attention to things of low interest, even when those things are important to his/her life.   Client Treatment Preferences: Continue with present therapist   Healthcare consumer's goal for treatment:  Psychologist, Royetta Crochet, PhD will support the patient's ability to achieve the goals identified. Cognitive Behavioral Therapy, Dialectical Behavioral Therapy, Motivational Interviewing, organizational and time management skills, and other evidenced-based practices will be used to promote progress towards healthy functioning.   Healthcare consumer Mark Strong will: Actively participate in therapy, working towards healthy functioning.    *Justification for Continuation/Discontinuation of Goal: R=Revised, O=Ongoing, A=Achieved, D=Discontinued  Goal 1) Learn and implement relapse prevention skills that lead to the alleviation and help prevent the relapse of depression. . 5 Point Likert rating baseline date: 10/20/2021  Target Date Goal Was reviewed Status Code Progress towards goal/Likert rating  11/08/2022 11/07/2021          O 3/5 - pt has introduced new lifestyle known to reduce depression             Goal 2) Learn  and  implement coping skills that result in a reduction of anxiety and worry, and improved daily functioning.     5 Point Likert rating baseline date: 10/20/2021  Target Date Goal Was reviewed Status Code Progress towards goal/Likert rating  11/08/2022 11/07/2021           O 2/5 - pt implements coping skills inconsistently             Goal 3) Engage in social, productive, and recreational activities that are possible in spite of medical condition. 5 Point Likert rating baseline date: 10/20/2021  Target Date Goal Was reviewed Status Code Progress towards goal/Likert rating  11/08/2022 11/07/2021          O 2/5 - pt is making connections with neighbors and periodically attend church/church events             Goal 4) Comply with the medication regimen and necessary medical procedures, reporting any side effects or problems to physicians or therapists.  5 Point Likert rating baseline date: 10/20/2021   Target Date Goal Was reviewed Status Code Progress towards goal/Likert rating  11/08/2022 11/07/2021           O 5/5 - pt is compliant in taking medications as prescribed              Goal  5 Point Likert rating baseline date: 10/20/2021  Target Date Goal Was reviewed Status Code Progress towards goal/Likert rating  11/08/2022 11/07/2021           O 2/5 - pt inconsistently implements skills known to reduce distractibility and forgetfulness               This plan has been reviewed and created by the following participants:  This plan will be reviewed at least every 12 months. Date Behavioral Health Clinician Date Guardian/Patient   11/07/2021 Royetta Crochet, PhD  11/07/2021 Mark Strong                   Depression Rating: 0-1 Anxiety Rating: 1-2  Mark Strong reports that he got a couple different offers.  He d/e the process he's been through, keeping perspective and next steps.    Mark Strong shared that his mother's health is declining and they have had to consider assisted living.  Mark Strong d/p how he is  feeling about his mother's decline, his disappointment in his siblings and possible options for his mother.     Royetta Crochet, PhD

## 2022-03-13 ENCOUNTER — Ambulatory Visit: Payer: Medicare HMO | Admitting: Psychology

## 2022-04-03 ENCOUNTER — Ambulatory Visit (INDEPENDENT_AMBULATORY_CARE_PROVIDER_SITE_OTHER): Payer: Medicare HMO | Admitting: Psychology

## 2022-04-03 DIAGNOSIS — F411 Generalized anxiety disorder: Secondary | ICD-10-CM | POA: Diagnosis not present

## 2022-04-03 NOTE — Progress Notes (Signed)
PROGRESS NOTES:  Name: Kyrese Gartman Date: 04/03/2022 MRN: [id number] DOB: 04/19/1971 PCP: Wendie Agreste, MD  Time Spent: Start: 12:00 End: 12:55   Today I met with [name] in remote video (WebEx) face-to-face individual psychotherapy.   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 2) Learn and implement coping skills that result in a reduction of anxiety and worry, and improved daily functioning. 3)  Engage in social, productive, and recreational activities that are possible in spite of medical condition. 4) Comply with the medication regimen and necessary medical procedures, reporting any side effects or problems to physicians or therapists.  5) Learn and implement coping skills to reduce the disruptive influence of distractibility  and forgetfulness to improved daily functioning.        Client Statement of Needs: states he needs assistance with his depression, anxiety and ADHD.     Treatment Level: Bi-weekly Individual Outpatient Psychotherapy  Symptoms: A diagnosis of a chronic illness that is not life-threatening, but necessitates changes in living.  Autonomic hyperactivity (e.g., palpitations, shortness of breath, dry mouth, trouble swallowing, nausea, diarrhea).   Childhood history of Attention Deficit Disorder (ADD) that was either diagnosed or  later concluded due to the symptoms of behavioral problems at school, impulsivity, temper outbursts, and lack of concentration.  Depressed or irritable mood.   Diminished interest in or enjoyment of activities.  Disorganized in most areas of his/her life.  Easily distracted and drawn from task at hand.  Excessive and/or unrealistic worry that is difficult to control occurring more days than not for at least 6 months about a number of events or activities. Feelings of hopelessness, worthlessness, or inappropriate guilt. Lack of energy.  Low self-esteem.  Psychological or behavioral factors that influence the course of the medical condition.  Psychomotor agitation or retardation.   Sleeplessness or hypersomnia.   Social withdrawal.   Unable to concentrate or pay attention to things of low interest, even when those things are important to his/her life.   Client Treatment Preferences: Continue with present therapist   Healthcare consumer's goal for treatment:  Psychologist, Royetta Crochet, PhD will support the patient's ability to achieve the goals identified. Cognitive Behavioral Therapy, Dialectical Behavioral Therapy, Motivational Interviewing, organizational and time management skills, and other evidenced-based practices will be used to promote progress towards healthy functioning.   Healthcare consumer Jaskirat Schwieger will: Actively participate in therapy, working towards healthy functioning.    *Justification for Continuation/Discontinuation of Goal: R=Revised, O=Ongoing, A=Achieved, D=Discontinued  Goal 1) Learn and implement relapse prevention skills that lead to the alleviation and help prevent the relapse of depression. . 5 Point Likert rating baseline date: 10/20/2021  Target Date Goal Was reviewed Status Code Progress towards goal/Likert rating  11/08/2022 11/07/2021          O 3/5 - pt has introduced new lifestyle known to reduce depression             Goal 2) Learn  and  implement coping skills that result in a reduction of anxiety and worry, and improved daily functioning.     5 Point Likert rating baseline date: 10/20/2021  Target Date Goal Was reviewed Status Code Progress towards goal/Likert rating  11/08/2022 11/07/2021           O 2/5 - pt implements coping skills inconsistently             Goal 3) Engage in social, productive, and recreational activities that are possible in spite of medical condition. 5 Point Likert rating baseline date: 10/20/2021  Target Date Goal Was reviewed Status Code Progress towards goal/Likert rating  11/08/2022 11/07/2021          O 2/5 - pt is making connections with neighbors and periodically attend church/church events             Goal 4) Comply with the medication regimen and necessary medical procedures, reporting any side effects or problems to  physicians or therapists.  5 Point Likert rating baseline date: 10/20/2021   Target Date Goal Was reviewed Status Code Progress towards goal/Likert rating  11/08/2022 11/07/2021           O 5/5 - pt is compliant in taking medications as prescribed              Goal  5 Point Likert rating baseline date: 10/20/2021  Target Date Goal Was reviewed Status Code Progress towards goal/Likert rating  11/08/2022 11/07/2021           O 2/5 - pt inconsistently implements skills known to reduce distractibility and forgetfulness               This plan has been reviewed and created by the following participants:  This plan will be reviewed at least every 12 months. Date Behavioral Health Clinician Date Guardian/Patient   11/07/2021 Royetta Crochet, PhD  11/07/2021 [name]                   Depression Rating: 0-1 Anxiety Rating: 1-2  [name] reports that he will be closing on his new home in [place] in November.  He shared his experience navigating the sale and purchase of his new home.  There were also some family issue that arose around his decision to return to [place]  that we d/p in session today.  [name] states that his psychiatrist has put him on 25 mg of Stratera.  He states that he had a question about whether he was on too much depression medication.  We d/ his concerns, I provided some psycho education about the different medications he is taking and encouraged him to talk to his psychiatrist.    Royetta Crochet, PhD

## 2022-04-10 ENCOUNTER — Ambulatory Visit: Payer: Medicare HMO | Admitting: Psychology

## 2022-05-01 ENCOUNTER — Ambulatory Visit (INDEPENDENT_AMBULATORY_CARE_PROVIDER_SITE_OTHER): Payer: Medicare HMO | Admitting: Psychology

## 2022-05-01 DIAGNOSIS — F411 Generalized anxiety disorder: Secondary | ICD-10-CM

## 2022-05-01 NOTE — Progress Notes (Signed)
PROGRESS NOTES:  Name: Mark Strong Date: 05/01/2022 MRN: [id number] DOB: 07-07-1970 PCP: Wendie Agreste, MD  Time Spent: Start: 12:00 End: 12:55   Today I met with Mark Strong in remote video (WebEx) face-to-face individual psychotherapy.   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 2) Learn and implement coping skills that result in a reduction of anxiety and worry, and improved daily functioning. 3)  Engage in social, productive, and recreational activities that are possible in spite of medical condition. 4) Comply with the medication regimen and necessary medical procedures, reporting any side effects or problems to physicians or therapists.  5) Learn and implement coping skills to reduce the disruptive influence of distractibility  and forgetfulness to improved daily functioning.        Client Statement of Needs: states he needs assistance with his depression, anxiety and ADHD.     Treatment Level: Bi-weekly Individual Outpatient Psychotherapy  Symptoms: A diagnosis of a chronic illness that is not life-threatening, but necessitates changes in living.  Autonomic hyperactivity (e.g., palpitations, shortness of breath, dry mouth, trouble swallowing, nausea, diarrhea).   Childhood history of Attention Deficit Disorder (ADD) that was either diagnosed or  later concluded due to the symptoms of behavioral problems at school, impulsivity, temper outbursts, and lack of concentration.  Depressed or irritable mood.   Diminished interest in or enjoyment of activities.  Disorganized in most areas of his/her life.  Easily distracted and drawn from task at hand.  Excessive and/or unrealistic worry that is difficult to control occurring more days than not for at least 6 months about a number of events or activities. Feelings of hopelessness, worthlessness, or inappropriate guilt. Lack of energy.  Low self-esteem.  Psychological or behavioral factors that influence the course of the medical condition.  Psychomotor agitation or retardation.   Sleeplessness or hypersomnia.   Social withdrawal.   Unable to concentrate or pay attention to things of low interest, even when those things are important to his/her life.   Client Treatment Preferences: Continue with present therapist   Healthcare consumer's goal for treatment:  Psychologist, Royetta Crochet, PhD will support the patient's ability to achieve the goals identified. Cognitive Behavioral Therapy, Dialectical Behavioral Therapy, Motivational Interviewing, organizational and time management skills, and other evidenced-based practices will be used to promote progress towards healthy functioning.   Healthcare consumer Mark Strong will: Actively participate in therapy, working towards healthy functioning.    *Justification for Continuation/Discontinuation of Goal: R=Revised, O=Ongoing, A=Achieved, D=Discontinued  Goal 1) Learn and implement relapse prevention skills that lead to the alleviation and help prevent the relapse of depression. . 5 Point Likert rating baseline date: 10/20/2021  Target Date Goal Was reviewed Status Code Progress towards goal/Likert rating  11/08/2022 11/07/2021          O 3/5 - pt has introduced new lifestyle known to reduce depression             Goal 2) Learn  and  implement coping skills that result in a reduction of anxiety and worry, and improved daily functioning.     5 Point Likert rating baseline date: 10/20/2021  Target Date Goal Was reviewed Status Code Progress towards goal/Likert rating  11/08/2022 11/07/2021           O 2/5 - pt implements coping skills inconsistently             Goal 3) Engage in social, productive, and recreational activities that are possible in spite of medical condition. 5 Point Likert rating baseline date: 10/20/2021  Target Date Goal Was reviewed Status Code Progress towards goal/Likert rating  11/08/2022 11/07/2021          O 2/5 - pt is making connections with neighbors and periodically attend church/church events             Goal 4) Comply with the medication regimen and necessary medical procedures, reporting any side effects or problems to  physicians or therapists.  5 Point Likert rating baseline date: 10/20/2021   Target Date Goal Was reviewed Status Code Progress towards goal/Likert rating  11/08/2022 11/07/2021           O 5/5 - pt is compliant in taking medications as prescribed              Goal  5 Point Likert rating baseline date: 10/20/2021  Target Date Goal Was reviewed Status Code Progress towards goal/Likert rating  11/08/2022 11/07/2021           O 2/5 - pt inconsistently implements skills known to reduce distractibility and forgetfulness               This plan has been reviewed and created by the following participants:  This plan will be reviewed at least every 12 months. Date Behavioral Health Clinician Date Guardian/Patient   11/07/2021 Royetta Crochet, PhD  11/07/2021 Mark Strong                   Depression Rating: 0-1 Anxiety Rating: 1-2  Mark Strong reports that he is still making minor repairs on his home as  a result of the walk through.  He shared a number of issues with the home he is moving into and the updates he is planning to make.  Mark Strong states that he really happy with the  change and move back to [place].     Royetta Crochet, PhD

## 2022-05-15 ENCOUNTER — Ambulatory Visit: Payer: Medicare HMO | Admitting: Psychology

## 2022-05-24 ENCOUNTER — Ambulatory Visit: Payer: Medicare HMO | Admitting: Psychology

## 2022-05-24 DIAGNOSIS — F330 Major depressive disorder, recurrent, mild: Secondary | ICD-10-CM | POA: Diagnosis not present

## 2022-05-24 NOTE — Progress Notes (Signed)
PROGRESS NOTES:  Name: Mark Strong Date: 05/24/2022 MRN: [id number] DOB: 30-Oct-1970 PCP: Wendie Agreste, MD  Time Spent: Start: 8:00 am End:  8:55 am   Today I met with Mark Strong in office for in person face-to-face individual psychotherapy.     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 2) Learn and implement coping skills that result in a reduction of anxiety and worry, and improved daily functioning. 3)  Engage in social, productive, and recreational activities that are possible in spite of medical condition. 4) Comply with the medication regimen and necessary medical procedures, reporting any side effects or problems to physicians or therapists.  5) Learn and implement coping skills to reduce the disruptive influence of distractibility  and forgetfulness to improved daily functioning.        Client Statement of Needs: states he needs assistance with his depression, anxiety and ADHD.     Treatment Level: Bi-weekly Individual Outpatient Psychotherapy  Symptoms: A diagnosis of a chronic illness that is not life-threatening, but necessitates changes in living.  Autonomic hyperactivity (e.g., palpitations, shortness of breath, dry mouth, trouble swallowing, nausea, diarrhea).   Childhood history of Attention Deficit Disorder (ADD) that was either diagnosed or later concluded due to the symptoms of behavioral problems at school, impulsivity, temper outbursts, and lack of concentration.   Depressed or irritable mood.   Diminished interest in or enjoyment of activities.  Disorganized in most areas of his/her life.  Easily distracted and drawn from task at hand.  Excessive and/or unrealistic worry that is difficult to control occurring more days than not for at least 6 months about a number of events or activities. Feelings of hopelessness, worthlessness, or inappropriate guilt. Lack of energy.  Low self-esteem.  Psychological or behavioral factors that influence the course of the medical condition.  Psychomotor agitation or retardation.   Sleeplessness or hypersomnia.   Social withdrawal.   Unable to concentrate or pay attention to things of low interest, even when those things are important to his/her life.   Client Treatment Preferences: Continue with present therapist   Healthcare consumer's goal for treatment:  Psychologist, Mark Crochet, PhD will support the patient's ability to achieve the goals identified. Cognitive Behavioral Therapy, Dialectical Behavioral Therapy, Motivational Interviewing, organizational and time management skills, and other evidenced-based practices will be used to promote progress towards healthy functioning.   Healthcare consumer Mark Strong will: Actively participate in therapy, working towards healthy functioning.    *Justification for Continuation/Discontinuation of Goal: R=Revised, O=Ongoing, A=Achieved, D=Discontinued  Goal 1) Learn and implement relapse prevention skills that lead to the alleviation and help prevent the relapse of depression. . 5 Point Likert rating baseline date: 10/20/2021  Target Date Goal Was reviewed Status Code Progress towards goal/Likert rating  11/08/2022 11/07/2021          O 3/5 - pt has introduced new lifestyle known to reduce depression             Goal 2) Learn and implement coping skills that result in a reduction of anxiety and worry, and improved  daily functioning.     5 Point Likert  rating baseline date: 10/20/2021  Target Date Goal Was reviewed Status Code Progress towards goal/Likert rating  11/08/2022 11/07/2021           O 2/5 - pt implements coping skills inconsistently             Goal 3) Engage in social, productive, and recreational activities that are possible in spite of medical condition. 5 Point Likert rating baseline date: 10/20/2021  Target Date Goal Was reviewed Status Code Progress towards goal/Likert rating  11/08/2022 11/07/2021          O 2/5 - pt is making connections with neighbors and periodically attend church/church events             Goal 4) Comply with the medication regimen and necessary medical procedures, reporting any side effects or problems to  physicians or therapists.  5 Point Likert rating baseline date: 10/20/2021   Target Date Goal Was reviewed Status Code Progress towards goal/Likert rating  11/08/2022 11/07/2021           O 5/5 - pt is compliant in taking medications as prescribed              Goal  5 Point Likert rating baseline date: 10/20/2021  Target Date Goal Was reviewed Status Code Progress towards goal/Likert rating  11/08/2022 11/07/2021           O 2/5 - pt inconsistently implements skills known to reduce distractibility and forgetfulness               This plan has been reviewed and created by the following participants:  This plan will be reviewed at least every 12 months. Date Behavioral Health Clinician Date Guardian/Patient   11/07/2021 Mark Crochet, PhD  11/07/2021 Mark Strong                   Depression Rating: 0-1 Anxiety Rating: 1-2  Mark Strong reports that he is still settling in but is happy to be back in [place].  We talked about how much comfortable he feels in his new home.  He's spoken to his mother who's is likely to come to live in [place].  They have researched some possible places for her to live.  We d/e/p some issues he is having with his siblings as a result of his mother's  decision.  I  provided the support and guidance Mark Strong needed to reinforce that he was acting well in his mother's best interest.   Mark Crochet, PhD

## 2022-05-26 DIAGNOSIS — M545 Low back pain, unspecified: Secondary | ICD-10-CM | POA: Diagnosis not present

## 2022-05-26 DIAGNOSIS — M546 Pain in thoracic spine: Secondary | ICD-10-CM | POA: Diagnosis not present

## 2022-05-29 ENCOUNTER — Ambulatory Visit: Payer: Medicare HMO | Admitting: Psychology

## 2022-05-29 DIAGNOSIS — M47816 Spondylosis without myelopathy or radiculopathy, lumbar region: Secondary | ICD-10-CM | POA: Diagnosis not present

## 2022-05-29 DIAGNOSIS — M47814 Spondylosis without myelopathy or radiculopathy, thoracic region: Secondary | ICD-10-CM | POA: Diagnosis not present

## 2022-06-06 DIAGNOSIS — I2510 Atherosclerotic heart disease of native coronary artery without angina pectoris: Secondary | ICD-10-CM | POA: Diagnosis not present

## 2022-06-06 DIAGNOSIS — E039 Hypothyroidism, unspecified: Secondary | ICD-10-CM | POA: Diagnosis not present

## 2022-06-06 DIAGNOSIS — F339 Major depressive disorder, recurrent, unspecified: Secondary | ICD-10-CM | POA: Diagnosis not present

## 2022-06-26 ENCOUNTER — Ambulatory Visit: Payer: Medicare HMO | Admitting: Psychology

## 2022-06-27 DIAGNOSIS — I495 Sick sinus syndrome: Secondary | ICD-10-CM | POA: Diagnosis not present

## 2022-06-28 ENCOUNTER — Encounter: Payer: Self-pay | Admitting: Cardiovascular Disease

## 2022-06-28 ENCOUNTER — Telehealth: Payer: Self-pay

## 2022-06-28 ENCOUNTER — Ambulatory Visit: Payer: Medicare HMO | Attending: Cardiovascular Disease | Admitting: Cardiovascular Disease

## 2022-06-28 VITALS — BP 124/88 | HR 62 | Ht 74.0 in | Wt 251.6 lb

## 2022-06-28 DIAGNOSIS — Z950 Presence of cardiac pacemaker: Secondary | ICD-10-CM

## 2022-06-28 DIAGNOSIS — G4733 Obstructive sleep apnea (adult) (pediatric): Secondary | ICD-10-CM | POA: Diagnosis not present

## 2022-06-28 DIAGNOSIS — E7800 Pure hypercholesterolemia, unspecified: Secondary | ICD-10-CM

## 2022-06-28 DIAGNOSIS — Z79899 Other long term (current) drug therapy: Secondary | ICD-10-CM

## 2022-06-28 DIAGNOSIS — I480 Paroxysmal atrial fibrillation: Secondary | ICD-10-CM

## 2022-06-28 DIAGNOSIS — I495 Sick sinus syndrome: Secondary | ICD-10-CM

## 2022-06-28 DIAGNOSIS — R6889 Other general symptoms and signs: Secondary | ICD-10-CM | POA: Diagnosis not present

## 2022-06-28 LAB — CBC

## 2022-06-28 NOTE — Patient Instructions (Signed)
Medication Instructions:  Your physician recommends that you continue on your current medications as directed. Please refer to the Current Medication list given to you today.  *If you need a refill on your cardiac medications before your next appointment, please call your pharmacy*   Lab Work: CMET, CBC, Lipid, TSH, FT4 today  If you have labs (blood work) drawn today and your tests are completely normal, you will receive your results only by: [place] (if you have MyChart) OR A paper copy in the mail If you have any lab test that is abnormal or we need to change your treatment, we will call you to review the results.   Testing/Procedures: Your physician has requested that you have an echocardiogram. Echocardiography is a painless test that uses sound waves to create images of your heart. It provides your doctor with information about the size and shape of your heart and how well your heart's chambers and valves are working. This procedure takes approximately one hour. There are no restrictions for this procedure. Please do NOT wear cologne, perfume, aftershave, or lotions (deodorant is allowed). Please arrive 15 minutes prior to your appointment time.  Follow-Up: At [facility], you and your health needs are our priority.  As part of our continuing mission to provide you with exceptional heart care, we have created designated Provider Care Teams.  These Care Teams include your primary Cardiologist (physician) and Advanced Practice Providers (APPs -  Physician Assistants and Nurse Practitioners) who all work together to provide you with the care you need, when you need it.  We recommend signing up for the patient portal called "MyChart".  Sign up information is provided on this After Visit Summary.  MyChart is used to connect with patients for Virtual Visits (Telemedicine).  Patients are able to view lab/test results, encounter notes, upcoming appointments, etc.  Non-urgent  messages can be sent to your provider as well.   To learn more about what you can do with MyChart, go to [email or web address].    Your next appointment:   6 month(s)  Provider:   Dr. Sallyanne Kuster

## 2022-06-28 NOTE — Progress Notes (Signed)
Cardiology Office Note:    Date:  06/28/2022   ID:  Mark Strong, DOB May 15, 1971, MRN [id number]  PCP:  Wendie Agreste, MD  Cardiologist:  None  Electrophysiologist:  None   Referring MD: Wendie Agreste, MD   Chief Complaint  Patient presents with   Pacemaker Check    History of Present Illness:    Mark Strong is a 52 y.o. 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  He received a dual-chamber permanent pacemaker in 2017 in [place].    He moved to [facility] for couple of years to take care of his ailing father, who unfortunately has passed away.  He is now back in [place] and is here to reestablish follow-up.  This is his first visit with [place] since August 2021.  While he was in [place] he had an episode of chest pain and was referred for coronary CT angiography.  This showed widespread atherosclerotic plaque with 25% stenoses in all the major coronary territories and a 25-49% stenosis in the mid LAD artery.  The formal calcium score was not calculated.  Mark Strong does not remember complaining of chest pain, but did have some shortness of breath.  He did not have an echocardiogram performed at that time.  He is trying to eat a healthy diet, but unfortunately the last couple of years has gained weight, almost 50 pounds since July 2021.  He is now mildly obese with a BMI of 32.  He is upset and believes that his weight gain is due to an insufficient dose of levothyroxine, which was decreased even though his TSH was in normal range.  He has not been exercising as much.  He he has some exertional dyspnea, but denies palpitations, syncope, chest pain at rest or with activity, leg edema or intermittent claudication.  He uses CPAP, conscientiously.  He does not have daytime hypersomnolence.  He wonders whether he could receive a Inspire device instead.  He has erectile dysfunction that responds to PDE 5  inhibitors.  His dual-chamber Biotronik E. Luna 8 pacemaker was implanted in 2017.  As before, he has 92% atrial pacing and only 1% ventricular pacing.  Device longevity is estimated at 6.5 years and all lead parameters are excellent.  He has had 4 brief episodes of paroxysmal atrial tachycardia with 1: 1 AV conduction with the longest being only 15 beats in duration.  He had a single episode of paroxysmal atrial fibrillation lasting for 36 seconds in March 2023, without recurrence.  Previously his device had reported a 5-minute episode of atrial fibrillation in May 2021.  The heart rate histogram distribution appears appropriate.  On atorvastatin.  Uses Cialis for ED.  Compliant with CPAP for sleep apnea.  Has chronic pain and receives chronic buprenorphine.  Takes appropriate hormone supplements for hypothyroidism and allopurinol for hyperuricemia.  Prior to pacemaker implantation he had normal coronary angiography in 2016 and EF 55%.  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  He has a history of endovenous laser ablation of the right greater saphenous vein.    Past Medical History:  Diagnosis Date   Memory loss    Pacemaker    PVNS (pigmented villonodular synovitis)    Sciatica    Sleep apnea    Thyroid disease    Varicose veins of right leg with edema     Past  Surgical History:  Procedure Laterality Date   CARDIAC CATHETERIZATION     left and right    INSERT / REPLACE / REMOVE PACEMAKER     Biotronik, dual-chamber, 05/18/2016 for SSS   Right greater saphenous vein ablation Right     Current Medications: Current Meds  Medication Sig   aspirin EC 81 MG tablet Take 81 mg by mouth once.   atomoxetine (STRATTERA) 25 MG capsule Take 25 mg by mouth every morning.   atorvastatin (LIPITOR) 10 MG tablet TAKE 1 TABLET(10 MG) BY MOUTH DAILY AT 6 PM   Blood Pressure Monitoring (BLOOD PRESSURE  CUFF) MISC 1 Device by Does not apply route once for 1 dose.   buPROPion (WELLBUTRIN XL) 150 MG 24 hr tablet Take 150 mg by mouth daily.   Docusate Sodium (COLACE PO) Take by mouth.   DULoxetine (CYMBALTA) 60 MG capsule Take 60 mg by mouth 2 (two) times daily.   finasteride (PROSCAR) 5 MG tablet Take 1 tablet by mouth daily.   ibuprofen (ADVIL) 800 MG tablet Take 800 mg by mouth 3 (three) times daily.   levothyroxine (SYNTHROID) 50 MCG tablet Take 1 tablet by mouth every morning.   Multiple Vitamin (MULTIVITAMIN) capsule Take 1 capsule by mouth daily.   NASACORT ALLERGY 24HR 55 MCG/ACT AERO nasal inhaler USE 2-3 SPRAYS IN EACH NOSTRIL QD UTD   sildenafil (VIAGRA) 25 MG tablet Take 25 mg by mouth as needed.   tamsulosin (FLOMAX) 0.4 MG CAPS capsule Take 1 capsule (0.4 mg total) by mouth daily.   traZODone (DESYREL) 50 MG tablet Take 50 mg by mouth at bedtime as needed.   Vitamin D, Ergocalciferol, (DRISDOL) 1.25 MG (50000 UNIT) CAPS capsule Take 50,000 Units by mouth once a week.     Allergies:   Betadine [povidone iodine]   Social History   Socioeconomic History   Marital status: Single    Spouse name: Not on file   Number of children: 0   Years of education: 12   Highest education level: 12th grade  Occupational History   Not on file  Tobacco Use   Smoking status: Never   Smokeless tobacco: Never  Vaping Use   Vaping Use: Never used  Substance and Sexual Activity   Alcohol use: Not Currently   Drug use: Not Currently   Sexual activity: Yes  Other Topics Concern   Not on file  Social History Narrative   Right handed   Social Determinants of Health   Financial Resource Strain: Not on file  Food Insecurity: Not on file  Transportation Needs: Not on file  Physical Activity: Not on file  Stress: Not on file  Social Connections: Not on file     Family History: The patient's family history includes Arthritis in his father and mother; COPD in his father; Cancer in his  brother; Depression in his mother; Diabetes in his father; Heart disease in his mother; Hyperlipidemia in his father; Hypertension in his father; Kidney disease in his father; Thyroid disease in his father.  ROS:   Please see the history of present illness.     All other systems reviewed and are negative.  EKGs/Labs/Other Studies Reviewed:    The following studies were reviewed today: Comprehensive pacemaker check.  EKG:  EKG is ordered today.  Patient is atrial paced, ventricular sensed rhythm with left axis deviation, otherwise normal tracing.  QTc 434 ms.  Recent Labs: No results found for requested labs within last 365 days.  Labs from care  everywhere  09/28/2021 TSH 1.799, free T40.62, negative thyroid peroxidase antibodies Vitamin D 25-hydroxy 23 (low) 12/11/2021 Creatinine 1.02, potassium 4.7  Recent Lipid Panel    Component Value Date/Time   CHOL 153 03/02/2019 1126   TRIG 71 03/02/2019 1126   HDL 68 03/02/2019 1126   CHOLHDL 2.3 03/02/2019 1126   LDLCALC 71 03/02/2019 1126  09/28/2021 Cholesterol 153, triglycerides 90, HDL 56, LDL 79  Physical Exam:    VS:  BP 124/88 (BP Location: Left Arm, Patient Position: Sitting, Cuff Size: Large)   Pulse 62   Ht 6\' 2"  (1.88 m)   Wt 251 lb 9.6 oz (114.1 kg)   SpO2 98%   BMI 32.30 kg/m     Wt Readings from Last 3 Encounters:  06/28/22 251 lb 9.6 oz (114.1 kg)  01/18/20 (!) 210 lb 12.8 oz (95.6 kg)  01/08/20 (!) 203 lb (92.1 kg)     General: Alert, oriented x3, no distress, healthy left subclavian pacemaker site Head: no evidence of trauma, PERRL, EOMI, no exophtalmos or lid lag, no myxedema, no xanthelasma; normal ears, nose and oropharynx Neck: normal jugular venous pulsations and no hepatojugular reflux; brisk carotid pulses without delay and no carotid bruits Chest: clear to auscultation, no signs of consolidation by percussion or palpation, normal fremitus, symmetrical and full respiratory  excursions Cardiovascular: normal position and quality of the apical impulse, regular rhythm, normal first and second heart sounds, no murmurs, rubs or gallops Abdomen: no tenderness or distention, no masses by palpation, no abnormal pulsatility or arterial bruits, normal bowel sounds, no hepatosplenomegaly Extremities: no clubbing, cyanosis or edema; 2+ radial, ulnar and brachial pulses bilaterally; 2+ right femoral, posterior tibial and dorsalis pedis pulses; 2+ left femoral, posterior tibial and dorsalis pedis pulses; no subclavian or femoral bruits Neurological: grossly nonfocal Psych: Normal mood and affect    ASSESSMENT:    1. PAF (paroxysmal atrial fibrillation) (HCC)   2. SSS (sick sinus syndrome) (HCC)   3. Pacemaker   4. OSA (obstructive sleep apnea)   5. Hypercholesterolemia     PLAN:    In order of problems listed above:  AFib: Extremely low burden, less than 1 minute in the last 12 months.  Also has occasional brief episodes of nonsustained atrial tachycardia with 1: 1 AV conduction.  Even if we consider the findings on his coronary CT angiogram to represent coronary disease, his CHA2DS2-VASc score is at most 1.  I think it is appropriate for him to take aspirin because of the findings on the coronary angiograms, but I do not think he needs anticoagulation at this point. The arrhythmia may be related to his underlying conduction system disease or to a genetic predisposition.  The echo in 2021 did show a dilated left atrium.  Will reassess, since he had complaints of shortness of breath. SSS: Virtually 100% atrial paced with good heart rate histogram distribution. Pacemaker: Will reestablish follow-up with our pacemaker clinic. OSA: Reports 100% compliance with CPAP and good benefit from treatment.  He denies any daytime hypersomnolence. Curious about an Inspire device.  Will refer to one of our sleep clinic colleagues. HLP: On statin.  Target LDL less than 70 based on coronary  CT findings.   Medication Adjustments/Labs and Tests Ordered: Current medicines are reviewed at length with the patient today.  Concerns regarding medicines are outlined above.  Orders Placed This Encounter  Procedures   Lipid panel   TSH   T4, free   Comprehensive metabolic panel   CBC  EKG 12-Lead   ECHOCARDIOGRAM COMPLETE   No orders of the defined types were placed in this encounter.   Patient Instructions  Medication Instructions:  Your physician recommends that you continue on your current medications as directed. Please refer to the Current Medication list given to you today.  *If you need a refill on your cardiac medications before your next appointment, please call your pharmacy*   Lab Work: CMET, CBC, Lipid, TSH, FT4 today  If you have labs (blood work) drawn today and your tests are completely normal, you will receive your results only by: MyChart Message (if you have MyChart) OR A paper copy in the mail If you have any lab test that is abnormal or we need to change your treatment, we will call you to review the results.   Testing/Procedures: Your physician has requested that you have an echocardiogram. Echocardiography is a painless test that uses sound waves to create images of your heart. It provides your doctor with information about the size and shape of your heart and how well your heart's chambers and valves are working. This procedure takes approximately one hour. There are no restrictions for this procedure. Please do NOT wear cologne, perfume, aftershave, or lotions (deodorant is allowed). Please arrive 15 minutes prior to your appointment time.  Follow-Up: At [facility], you and your health needs are our priority.  As part of our continuing mission to provide you with exceptional heart care, we have created designated Provider Care Teams.  These Care Teams include your primary Cardiologist (physician) and Advanced Practice Providers (APPs -   Physician Assistants and Nurse Practitioners) who all work together to provide you with the care you need, when you need it.  We recommend signing up for the patient portal called "MyChart".  Sign up information is provided on this After Visit Summary.  MyChart is used to connect with patients for Virtual Visits (Telemedicine).  Patients are able to view lab/test results, encounter notes, upcoming appointments, etc.  Non-urgent messages can be sent to your provider as well.   To learn more about what you can do with MyChart, go to [email or web address].    Your next appointment:   6 month(s)  Provider:   Dr. Royann Shivers     Signed, Thurmon Fair, MD  06/28/2022 2:58 PM    [facility]

## 2022-06-28 NOTE — Telephone Encounter (Signed)
I called the [facility] to have the pt release in Biotronik. I will try to put the patient in 06/29/2022.  His PID# [id number]

## 2022-06-28 NOTE — Telephone Encounter (Signed)
-----   Message from Damian Leavell, RN sent at 06/28/2022  9:43 AM EST ----- Regarding: FW: Reestablish device  ----- Message ----- From: Silverio Lay, RN Sent: 06/28/2022   9:42 AM EST To: [facility] Subject: Reestablish device                             Hello!  Dr. Sallyanne Kuster would like this patient to get reestablished for transmissions.   Previously followed, moved, but now back.     Thanks!

## 2022-06-29 ENCOUNTER — Other Ambulatory Visit: Payer: Self-pay | Admitting: Cardiovascular Disease

## 2022-06-29 DIAGNOSIS — I495 Sick sinus syndrome: Secondary | ICD-10-CM | POA: Diagnosis not present

## 2022-06-29 LAB — COMPREHENSIVE METABOLIC PANEL
ALT: 17 IU/L (ref 0–44)
AST: 20 IU/L (ref 0–40)
Albumin/Globulin Ratio: 2 (ref 1.2–2.2)
Albumin: 4.7 g/dL (ref 3.8–4.9)
Alkaline Phosphatase: 64 IU/L (ref 44–121)
BUN/Creatinine Ratio: 10 (ref 9–20)
BUN: 12 mg/dL (ref 6–24)
Bilirubin Total: 0.5 mg/dL (ref 0.0–1.2)
CO2: 26 mmol/L (ref 20–29)
Calcium: 9.4 mg/dL (ref 8.7–10.2)
Chloride: 102 mmol/L (ref 96–106)
Creatinine, Ser: 1.21 mg/dL (ref 0.76–1.27)
Globulin, Total: 2.4 g/dL (ref 1.5–4.5)
Glucose: 83 mg/dL (ref 70–99)
Potassium: 4.3 mmol/L (ref 3.5–5.2)
Sodium: 141 mmol/L (ref 134–144)
Total Protein: 7.1 g/dL (ref 6.0–8.5)
eGFR: 72 mL/min/{1.73_m2} (ref >59)

## 2022-06-29 LAB — LIPID PANEL
Chol/HDL Ratio: 4 ratio (ref 0.0–5.0)
Cholesterol, Total: 211 mg/dL — ABNORMAL HIGH (ref 100–199)
HDL: 53 mg/dL (ref >39)
LDL Chol Calc (NIH): 133 mg/dL — ABNORMAL HIGH (ref 0–99)
Triglycerides: 139 mg/dL (ref 0–149)
VLDL Cholesterol Cal: 25 mg/dL (ref 5–40)

## 2022-06-29 LAB — TSH: TSH: 2.98 u[IU]/mL (ref 0.450–4.500)

## 2022-06-29 LAB — CBC
Hematocrit: 46.2 % (ref 37.5–51.0)
Hemoglobin: 15.9 g/dL (ref 13.0–17.7)
MCH: 28.9 pg (ref 26.6–33.0)
MCHC: 34.4 g/dL (ref 31.5–35.7)
MCV: 84 fL (ref 79–97)
Platelets: 254 10*3/uL (ref 150–450)
RBC: 5.51 x10E6/uL (ref 4.14–5.80)
RDW: 13.5 % (ref 11.6–15.4)
WBC: 6.1 10*3/uL (ref 3.4–10.8)

## 2022-06-29 LAB — T4, FREE: Free T4: 1.07 ng/dL (ref 0.82–1.77)

## 2022-06-29 MED ORDER — ATORVASTATIN CALCIUM 40 MG PO TABS: 40.0000 mg | ORAL_TABLET | Freq: Every day | ORAL | 0 refills | Status: DC

## 2022-06-29 MED ORDER — ATORVASTATIN CALCIUM 40 MG PO TABS: 40.0000 mg | ORAL_TABLET | Freq: Every day | ORAL | 3 refills | Status: DC

## 2022-06-29 NOTE — Telephone Encounter (Signed)
Patient states he is returning a call, but no one left him a message. He assumes it may have been regarding his medication. I informed him that it has been sent to [facility]. Please call patient back to confirm that this is what missed call was regarding.

## 2022-06-29 NOTE — Telephone Encounter (Signed)
Patient stated he cannot get mail order atorvastatin until February. He has 10mg  tablets the he will take four of; then I have snet in 30day suppoly to local pharmacy. Explained to pt he will have fasting blood work in 3 months. Verified address. Mailed out lab slip.

## 2022-06-29 NOTE — Addendum Note (Signed)
Addended by: Betha Loa F on: 06/29/2022 11:38 AM   Modules accepted: Orders

## 2022-07-02 ENCOUNTER — Other Ambulatory Visit: Payer: Self-pay

## 2022-07-02 DIAGNOSIS — E7800 Pure hypercholesterolemia, unspecified: Secondary | ICD-10-CM

## 2022-07-03 DIAGNOSIS — F902 Attention-deficit hyperactivity disorder, combined type: Secondary | ICD-10-CM | POA: Diagnosis not present

## 2022-07-03 DIAGNOSIS — M5442 Lumbago with sciatica, left side: Secondary | ICD-10-CM | POA: Diagnosis not present

## 2022-07-03 DIAGNOSIS — F32A Depression, unspecified: Secondary | ICD-10-CM | POA: Diagnosis not present

## 2022-07-03 DIAGNOSIS — I25118 Atherosclerotic heart disease of native coronary artery with other forms of angina pectoris: Secondary | ICD-10-CM | POA: Diagnosis not present

## 2022-07-03 DIAGNOSIS — F419 Anxiety disorder, unspecified: Secondary | ICD-10-CM | POA: Diagnosis not present

## 2022-07-03 DIAGNOSIS — E039 Hypothyroidism, unspecified: Secondary | ICD-10-CM | POA: Diagnosis not present

## 2022-07-03 DIAGNOSIS — R350 Frequency of micturition: Secondary | ICD-10-CM | POA: Diagnosis not present

## 2022-07-03 DIAGNOSIS — R6889 Other general symptoms and signs: Secondary | ICD-10-CM | POA: Diagnosis not present

## 2022-07-03 DIAGNOSIS — N401 Enlarged prostate with lower urinary tract symptoms: Secondary | ICD-10-CM | POA: Diagnosis not present

## 2022-07-03 DIAGNOSIS — G8929 Other chronic pain: Secondary | ICD-10-CM | POA: Diagnosis not present

## 2022-07-05 ENCOUNTER — Ambulatory Visit: Payer: Medicare HMO | Admitting: Psychology

## 2022-07-05 DIAGNOSIS — F411 Generalized anxiety disorder: Secondary | ICD-10-CM | POA: Diagnosis not present

## 2022-07-05 DIAGNOSIS — R6889 Other general symptoms and signs: Secondary | ICD-10-CM | POA: Diagnosis not present

## 2022-07-05 NOTE — Progress Notes (Signed)
PROGRESS NOTES:  Name: Mark Strong Date: 07/05/2022 MRN: [id number] DOB: [birth year] PCP: Wendie Agreste, MD  Time Spent: Start: 8:00 am End:  8:55 am   Today I met with Mark Strong in office for in person face-to-face individual psychotherapy.     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 2) Learn and implement coping skills that result in a reduction of anxiety and worry, and improved daily functioning. 3)  Engage in social, productive, and recreational activities that are possible in spite of medical condition. 4) Comply with the medication regimen and necessary medical procedures, reporting any side effects or problems to physicians or therapists.  5) Learn and implement coping skills to reduce the disruptive influence of distractibility  and forgetfulness to improved daily functioning.        Client Statement of Needs: states he needs assistance with his depression, anxiety and ADHD.     Treatment Level: Bi-weekly Individual Outpatient Psychotherapy  Symptoms: A diagnosis of a chronic illness that is not life-threatening, but necessitates changes in living.  Autonomic hyperactivity (e.g., palpitations, shortness of breath, dry mouth, trouble swallowing, nausea, diarrhea).   Childhood history of Attention Deficit Disorder (ADD) that was either diagnosed or later concluded due to the symptoms of behavioral problems at school, impulsivity, temper outbursts, and lack of concentration.   Depressed or irritable mood.   Diminished interest in or enjoyment of activities.  Disorganized in most areas of his/her life.  Easily distracted and drawn from task at hand.  Excessive and/or unrealistic worry that is difficult to control occurring more days than not for at least 6 months about a number of events or activities. Feelings of hopelessness, worthlessness, or inappropriate guilt. Lack of energy.  Low self-esteem.  Psychological or behavioral factors that influence the course of the medical condition.  Psychomotor agitation or retardation.   Sleeplessness or hypersomnia.   Social withdrawal.   Unable to concentrate or pay attention to things of low interest, even when those things are important to his/her life.   Client Treatment Preferences: Continue with present therapist   Healthcare consumer's goal for treatment:  Psychologist, Royetta Crochet, PhD will support the patient's ability to achieve the goals identified. Cognitive Behavioral Therapy, Dialectical Behavioral Therapy, Motivational Interviewing, organizational and time management skills, and other evidenced-based practices will be used to promote progress towards healthy functioning.   Healthcare consumer Mark Strong will: Actively participate in therapy, working towards healthy functioning.    *Justification for Continuation/Discontinuation of Goal: R=Revised, O=Ongoing, A=Achieved, D=Discontinued  Goal 1) Learn and implement relapse prevention skills that lead to the alleviation and help prevent the relapse of depression. . 5 Point Likert rating baseline date: 10/20/2021  Target Date Goal Was reviewed Status Code Progress towards goal/Likert rating  11/08/2022 11/07/2021          O 3/5 - pt has introduced new lifestyle known to reduce depression             Goal 2) Learn and implement coping skills that result in a reduction of anxiety and worry, and  improved daily functioning.     5 Point Likert  rating baseline date: 10/20/2021  Target Date Goal Was reviewed Status Code Progress towards goal/Likert rating  11/08/2022 11/07/2021           O 2/5 - pt implements coping skills inconsistently             Goal 3) Engage in social, productive, and recreational activities that are possible in spite of medical condition. 5 Point Likert rating baseline date: 10/20/2021  Target Date Goal Was reviewed Status Code Progress towards goal/Likert rating  11/08/2022 11/07/2021          O 2/5 - pt is making connections with neighbors and periodically attend church/church events             Goal 4) Comply with the medication regimen and necessary medical procedures, reporting any side effects or problems to  physicians or therapists.  5 Point Likert rating baseline date: 10/20/2021   Target Date Goal Was reviewed Status Code Progress towards goal/Likert rating  11/08/2022 11/07/2021           O 5/5 - pt is compliant in taking medications as prescribed              Goal  5 Point Likert rating baseline date: 10/20/2021  Target Date Goal Was reviewed Status Code Progress towards goal/Likert rating  11/08/2022 11/07/2021           O 2/5 - pt inconsistently implements skills known to reduce distractibility and forgetfulness               This plan has been reviewed and created by the following participants:  This plan will be reviewed at least every 12 months. Date Behavioral Health Clinician Date Guardian/Patient   11/07/2021 Royetta Crochet, PhD  11/07/2021 Mark Strong                   Depression Rating: 0-2 Anxiety Rating: 1-4  Mark Strong reports that he met with the cardiologist he used to see when he lived in [place] before.  He discovered that his cardiovascular health was worse than he was led to believe by his cardiologist in [place].  We d/e/p his frustration and the lifestyle changes he is trying to implement.  Lastly, we d/ that his ADHD medication is not working.  I gave Mark Strong a couple  of names for psychiatry practices in town for him to f/u with.   Royetta Crochet, PhD

## 2022-07-10 NOTE — Telephone Encounter (Signed)
The [facility] called stating they deactivated the patient on 06/28/2022.  I called [employer]ronik and the patient is now back in our system. I put him on a remote schedule.

## 2022-07-10 NOTE — Telephone Encounter (Signed)
I called the [facility] for the second time to ask them to release the patient in Biotronik. The receptionist states the nurse will give me a call back.

## 2022-07-11 DIAGNOSIS — M47816 Spondylosis without myelopathy or radiculopathy, lumbar region: Secondary | ICD-10-CM | POA: Diagnosis not present

## 2022-07-18 DIAGNOSIS — R6889 Other general symptoms and signs: Secondary | ICD-10-CM | POA: Diagnosis not present

## 2022-07-18 DIAGNOSIS — F330 Major depressive disorder, recurrent, mild: Secondary | ICD-10-CM | POA: Diagnosis not present

## 2022-07-18 DIAGNOSIS — F411 Generalized anxiety disorder: Secondary | ICD-10-CM | POA: Diagnosis not present

## 2022-07-18 DIAGNOSIS — F900 Attention-deficit hyperactivity disorder, predominantly inattentive type: Secondary | ICD-10-CM | POA: Diagnosis not present

## 2022-07-24 ENCOUNTER — Ambulatory Visit (HOSPITAL_COMMUNITY): Payer: Medicare HMO | Attending: Cardiovascular Disease

## 2022-07-24 ENCOUNTER — Ambulatory Visit: Payer: Medicare HMO | Admitting: Psychology

## 2022-07-24 DIAGNOSIS — Z950 Presence of cardiac pacemaker: Secondary | ICD-10-CM

## 2022-07-24 DIAGNOSIS — R6889 Other general symptoms and signs: Secondary | ICD-10-CM | POA: Diagnosis not present

## 2022-07-24 DIAGNOSIS — I495 Sick sinus syndrome: Secondary | ICD-10-CM | POA: Insufficient documentation

## 2022-07-24 DIAGNOSIS — I480 Paroxysmal atrial fibrillation: Secondary | ICD-10-CM

## 2022-07-24 LAB — ECHOCARDIOGRAM COMPLETE
Area-P 1/2: 2.87 cm2
S' Lateral: 2.8 cm

## 2022-08-01 DIAGNOSIS — F411 Generalized anxiety disorder: Secondary | ICD-10-CM | POA: Diagnosis not present

## 2022-08-01 DIAGNOSIS — F900 Attention-deficit hyperactivity disorder, predominantly inattentive type: Secondary | ICD-10-CM | POA: Diagnosis not present

## 2022-08-01 DIAGNOSIS — F330 Major depressive disorder, recurrent, mild: Secondary | ICD-10-CM | POA: Diagnosis not present

## 2022-08-06 DIAGNOSIS — M47816 Spondylosis without myelopathy or radiculopathy, lumbar region: Secondary | ICD-10-CM | POA: Diagnosis not present

## 2022-08-09 ENCOUNTER — Ambulatory Visit (INDEPENDENT_AMBULATORY_CARE_PROVIDER_SITE_OTHER): Payer: Medicare HMO | Admitting: Psychology

## 2022-08-09 DIAGNOSIS — F900 Attention-deficit hyperactivity disorder, predominantly inattentive type: Secondary | ICD-10-CM

## 2022-08-09 DIAGNOSIS — F331 Major depressive disorder, recurrent, moderate: Secondary | ICD-10-CM

## 2022-08-09 NOTE — Progress Notes (Signed)
PROGRESS NOTES:  Name: Mark Strong Date: 08/09/2022 MRN: [id number] DOB: [birth year] PCP: Wendie Agreste, MD  Time Spent: Start: 8:00 am End:  8:55 am   Today I met with [name] in office for in person face-to-face individual psychotherapy.     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 2) Learn and implement coping skills that result in a reduction of anxiety and worry, and improved daily functioning. 3)  Engage in social, productive, and recreational activities that are possible in spite of medical condition. 4) Comply with the medication regimen and necessary medical procedures, reporting any side effects or problems to physicians or therapists.  5) Learn and implement coping skills to reduce the disruptive influence of distractibility  and forgetfulness to improved daily functioning.        Client Statement of Needs: states he needs assistance with his depression, anxiety and ADHD.     Treatment Level: Bi-weekly Individual Outpatient Psychotherapy  Symptoms: A diagnosis of a chronic illness that is not life-threatening, but necessitates changes in living.  Autonomic hyperactivity (e.g., palpitations, shortness of breath, dry mouth, trouble swallowing, nausea, diarrhea).   Childhood history of Attention Deficit Disorder (ADD) that was either diagnosed or later concluded due to the symptoms of behavioral problems at school, impulsivity, temper outbursts, and lack of concentration.   Depressed or irritable mood.   Diminished interest in or enjoyment of activities.  Disorganized in most areas of his/her life.  Easily distracted and drawn from task at hand.  Excessive and/or unrealistic worry that is difficult to control occurring more days than not for at least 6 months about a number of events or activities. Feelings of hopelessness, worthlessness, or inappropriate guilt. Lack of energy.  Low self-esteem.  Psychological or behavioral factors that influence the course of the medical condition.  Psychomotor agitation or retardation.   Sleeplessness or hypersomnia.   Social withdrawal.   Unable to concentrate or pay attention to things of low interest, even when those things are important to his/her life.   Client Treatment Preferences: Continue with present therapist   Healthcare consumer's goal for treatment:  Psychologist, Royetta Crochet, PhD will support the patient's ability to achieve the goals identified. Cognitive Behavioral Therapy, Dialectical Behavioral Therapy, Motivational Interviewing, organizational and time management skills, and other evidenced-based practices will be used to promote progress towards healthy functioning.   Healthcare consumer [name] will: Actively participate in therapy, working towards healthy functioning.    *Justification for Continuation/Discontinuation of Goal: R=Revised, O=Ongoing, A=Achieved, D=Discontinued  Goal 1) Learn and implement relapse prevention skills that lead to the alleviation and help prevent the relapse of depression. . 5 Point Likert rating baseline date: 10/20/2021  Target Date Goal Was reviewed Status Code Progress towards goal/Likert rating  11/08/2022 11/07/2021          O 3/5 - pt has introduced new lifestyle known to reduce depression             Goal 2) Learn and implement coping skills that result in a reduction of anxiety and worry, and  improved daily functioning.     5 Point Likert  rating baseline date: 10/20/2021  Target Date Goal Was reviewed Status Code Progress towards goal/Likert rating  11/08/2022 11/07/2021           O 2/5 - pt implements coping skills inconsistently             Goal 3) Engage in social, productive, and recreational activities that are possible in spite of medical condition. 5 Point Likert rating baseline date: 10/20/2021  Target Date Goal Was reviewed Status Code Progress towards goal/Likert rating  11/08/2022 11/07/2021          O 2/5 - pt is making connections with neighbors and periodically attend church/church events             Goal 4) Comply with the medication regimen and necessary medical procedures, reporting any side effects or problems to  physicians or therapists.  5 Point Likert rating baseline date: 10/20/2021   Target Date Goal Was reviewed Status Code Progress towards goal/Likert rating  11/08/2022 11/07/2021           O 5/5 - pt is compliant in taking medications as prescribed              Goal  5 Point Likert rating baseline date: 10/20/2021  Target Date Goal Was reviewed Status Code Progress towards goal/Likert rating  11/08/2022 11/07/2021           O 2/5 - pt inconsistently implements skills known to reduce distractibility and forgetfulness               This plan has been reviewed and created by the following participants:  This plan will be reviewed at least every 12 months. Date Behavioral Health Clinician Date Guardian/Patient   11/07/2021 Royetta Crochet, PhD  11/07/2021 [name]                   Depression Rating: 2-4 Anxiety Rating: 1-4  [name] reports that he had a f/u with his [facility] provider - Teah Raliegh Ip.  He appears to be more settled and emotionally regulated.  We d/p how his mother is doing, plans to move her to [place] and feeling more positive about the future.  Lastly, he vented about his struggles and disappointment with his siblings.  I provided the support and guidance he  needed.   Royetta Crochet, PhD

## 2022-08-15 ENCOUNTER — Encounter: Payer: Self-pay | Admitting: Cardiovascular Disease

## 2022-08-15 DIAGNOSIS — M47816 Spondylosis without myelopathy or radiculopathy, lumbar region: Secondary | ICD-10-CM | POA: Diagnosis not present

## 2022-08-15 DIAGNOSIS — R0602 Shortness of breath: Secondary | ICD-10-CM

## 2022-08-15 DIAGNOSIS — F900 Attention-deficit hyperactivity disorder, predominantly inattentive type: Secondary | ICD-10-CM | POA: Diagnosis not present

## 2022-08-15 DIAGNOSIS — F411 Generalized anxiety disorder: Secondary | ICD-10-CM | POA: Diagnosis not present

## 2022-08-15 DIAGNOSIS — F330 Major depressive disorder, recurrent, mild: Secondary | ICD-10-CM | POA: Diagnosis not present

## 2022-08-15 NOTE — Telephone Encounter (Signed)
Please make the referral to a pulmonary specialist.

## 2022-08-21 ENCOUNTER — Ambulatory Visit: Payer: Medicare HMO | Admitting: Psychology

## 2022-08-22 DIAGNOSIS — Z9989 Dependence on other enabling machines and devices: Secondary | ICD-10-CM | POA: Diagnosis not present

## 2022-08-22 DIAGNOSIS — R6889 Other general symptoms and signs: Secondary | ICD-10-CM | POA: Diagnosis not present

## 2022-08-22 DIAGNOSIS — G8929 Other chronic pain: Secondary | ICD-10-CM | POA: Diagnosis not present

## 2022-08-22 DIAGNOSIS — F419 Anxiety disorder, unspecified: Secondary | ICD-10-CM | POA: Diagnosis not present

## 2022-08-22 DIAGNOSIS — M5442 Lumbago with sciatica, left side: Secondary | ICD-10-CM | POA: Diagnosis not present

## 2022-08-22 DIAGNOSIS — I25118 Atherosclerotic heart disease of native coronary artery with other forms of angina pectoris: Secondary | ICD-10-CM | POA: Diagnosis not present

## 2022-08-22 DIAGNOSIS — E039 Hypothyroidism, unspecified: Secondary | ICD-10-CM | POA: Diagnosis not present

## 2022-08-30 DIAGNOSIS — M47816 Spondylosis without myelopathy or radiculopathy, lumbar region: Secondary | ICD-10-CM | POA: Diagnosis not present

## 2022-08-30 DIAGNOSIS — R6889 Other general symptoms and signs: Secondary | ICD-10-CM | POA: Diagnosis not present

## 2022-09-03 ENCOUNTER — Ambulatory Visit (INDEPENDENT_AMBULATORY_CARE_PROVIDER_SITE_OTHER): Payer: Medicare HMO | Admitting: Psychology

## 2022-09-03 DIAGNOSIS — F330 Major depressive disorder, recurrent, mild: Secondary | ICD-10-CM | POA: Diagnosis not present

## 2022-09-03 NOTE — Progress Notes (Signed)
PROGRESS NOTES:  Name: Mark Strong Date: 09/03/2022 MRN: [id number] DOB: 03-21-1971 PCP: Wendie Agreste, MD  Time Spent: Start: 8:00 am End:  8:55 am   Today I met with [name] in office for in person face-to-face individual psychotherapy.     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 2) Learn and implement coping skills that result in a reduction of anxiety and worry, and improved daily functioning. 3)  Engage in social, productive, and recreational activities that are possible in spite of medical condition. 4) Comply with the medication regimen and necessary medical procedures, reporting any side effects or problems to physicians or therapists.  5) Learn and implement coping skills to reduce the disruptive influence of distractibility  and forgetfulness to improved daily functioning.        Client Statement of Needs: states he needs assistance with his depression, anxiety and ADHD.     Treatment Level: Bi-weekly Individual Outpatient Psychotherapy  Symptoms: A diagnosis of a chronic illness that is not life-threatening, but necessitates changes in living.  Autonomic hyperactivity (e.g., palpitations, shortness of breath, dry mouth, trouble swallowing, nausea, diarrhea).   Childhood history of Attention Deficit Disorder (ADD) that was either diagnosed or later concluded due to the symptoms of behavioral problems at school, impulsivity, temper outbursts, and lack of concentration.   Depressed or irritable mood.   Diminished interest in or enjoyment of activities.  Disorganized in most areas of his/her life.  Easily distracted and drawn from task at hand.  Excessive and/or unrealistic worry that is difficult to control occurring more days than not for at least 6 months about a number of events or activities. Feelings of hopelessness, worthlessness, or inappropriate guilt. Lack of energy.  Low self-esteem.  Psychological or behavioral factors that influence the course of the medical condition.  Psychomotor agitation or retardation.   Sleeplessness or hypersomnia.   Social withdrawal.   Unable to concentrate or pay attention to things of low interest, even when those things are important to his/her life.   Client Treatment Preferences: Continue with present therapist   Healthcare consumer's goal for treatment:  Psychologist, Royetta Crochet, PhD will support the patient's ability to achieve the goals identified. Cognitive Behavioral Therapy, Dialectical Behavioral Therapy, Motivational Interviewing, organizational and time management skills, and other evidenced-based practices will be used to promote progress towards healthy functioning.   Healthcare consumer [name] will: Actively participate in therapy, working towards healthy functioning.    *Justification for Continuation/Discontinuation of Goal: R=Revised, O=Ongoing, A=Achieved, D=Discontinued  Goal 1) Learn and implement relapse prevention skills that lead to the alleviation and help prevent the relapse of depression. . 5 Point Likert rating baseline date: 10/20/2021  Target Date Goal Was reviewed Status Code Progress towards goal/Likert rating  11/08/2022 11/07/2021          O 3/5 - pt has introduced new lifestyle known to reduce depression             Goal 2) Learn and implement coping skills that result in a reduction of anxiety and worry, and  improved daily functioning.     5 Point Likert  rating baseline date: 10/20/2021  Target Date Goal Was reviewed Status Code Progress towards goal/Likert rating  11/08/2022 11/07/2021           O 2/5 - pt implements coping skills inconsistently             Goal 3) Engage in social, productive, and recreational activities that are possible in spite of medical condition. 5 Point Likert rating baseline date: 10/20/2021  Target Date Goal Was reviewed Status Code Progress towards goal/Likert rating  11/08/2022 11/07/2021          O 2/5 - pt is making connections with neighbors and periodically attend church/church events             Goal 4) Comply with the medication regimen and necessary medical procedures, reporting any side effects or problems to  physicians or therapists.  5 Point Likert rating baseline date: 10/20/2021   Target Date Goal Was reviewed Status Code Progress towards goal/Likert rating  11/08/2022 11/07/2021           O 5/5 - pt is compliant in taking medications as prescribed              Goal  5 Point Likert rating baseline date: 10/20/2021  Target Date Goal Was reviewed Status Code Progress towards goal/Likert rating  11/08/2022 11/07/2021           O 2/5 - pt inconsistently implements skills known to reduce distractibility and forgetfulness               This plan has been reviewed and created by the following participants:  This plan will be reviewed at least every 12 months. Date Behavioral Health Clinician Date Guardian/Patient   11/07/2021 Royetta Crochet, PhD  11/07/2021 [name]                   Depression Rating: 2-4 Anxiety Rating: 1-4   Mark Strong reports that he has been in the process of helping his mother decide on an assisted living facility.  We d/e/p what has been happening, managing family dynamics and his disappointment that she will be staying in [place].  Lastly, we d/p that he is trying to be more social when he attends church and church related activities.  Mark Strong states that he has noticed  some changes since he stated ADHD medication.  He shared a number of differences he has noted and how his productivity has improved.  He has made an effort to use his skills to stay focused and mindfully attend to the task he is working to complete.      Royetta Crochet, PhD  [facility] - psychiatric PA, [place]

## 2022-09-04 ENCOUNTER — Other Ambulatory Visit (INDEPENDENT_AMBULATORY_CARE_PROVIDER_SITE_OTHER): Payer: Medicare HMO

## 2022-09-04 ENCOUNTER — Ambulatory Visit: Payer: Medicare HMO | Admitting: Physician Assistant

## 2022-09-04 ENCOUNTER — Encounter: Payer: Self-pay | Admitting: Physician Assistant

## 2022-09-04 VITALS — BP 105/72 | HR 77 | Resp 20 | Ht 74.0 in | Wt 242.0 lb

## 2022-09-04 DIAGNOSIS — R413 Other amnesia: Secondary | ICD-10-CM | POA: Diagnosis not present

## 2022-09-04 DIAGNOSIS — R41840 Attention and concentration deficit: Secondary | ICD-10-CM

## 2022-09-04 LAB — VITAMIN B12: Vitamin B-12: 237 pg/mL (ref 211–911)

## 2022-09-04 NOTE — Progress Notes (Unsigned)
Synopsis: Referred for dyspnea by Sanda Klein, MD  Subjective:   PATIENT ID: Mark Strong GENDER: male DOB: Mar 03, 1971, MRN: [id number]  No chief complaint on file.  52yM with history of SSS sp PM 2017, atypical AFlutter, nCAD, OSA on CPAP with good adherence, hypothyroid referred from Dr. Sallyanne Kuster for dyspnea  Otherwise pertinent review of systems is negative.  Past Medical History:  Diagnosis Date   Memory loss    Pacemaker    PVNS (pigmented villonodular synovitis)    Sciatica    Sleep apnea    Thyroid disease    Varicose veins of right leg with edema      Family History  Problem Relation Age of Onset   Arthritis Mother    Depression Mother    Heart disease Mother    Arthritis Father    COPD Father    Diabetes Father    Hyperlipidemia Father    Hypertension Father    Kidney disease Father    Thyroid disease Father    Cancer Brother      Past Surgical History:  Procedure Laterality Date   CARDIAC CATHETERIZATION     left and right    INSERT / REPLACE / REMOVE PACEMAKER     Biotronik, dual-chamber, 05/18/2016 for SSS   Right greater saphenous vein ablation Right     Social History   Socioeconomic History   Marital status: Single    Spouse name: Not on file   Number of children: 0   Years of education: 12   Highest education level: 12th grade  Occupational History   Not on file  Tobacco Use   Smoking status: Never   Smokeless tobacco: Never  Vaping Use   Vaping Use: Never used  Substance and Sexual Activity   Alcohol use: Not Currently   Drug use: Not Currently   Sexual activity: Yes  Other Topics Concern   Not on file  Social History Narrative   Right handed   Sugar free    Lives alone   Disabled    One floor home   Social Determinants of Health   Financial Resource Strain: Not on file  Food Insecurity: Not on file  Transportation Needs: Not on file  Physical Activity: Not on file  Stress: Not on file  Social Connections:  Not on file  Intimate Partner Violence: Not on file     Allergies  Allergen Reactions   Betadine [Povidone Iodine] Rash     Outpatient Medications Prior to Visit  Medication Sig Dispense Refill   aspirin EC 81 MG tablet Take 81 mg by mouth once.     atomoxetine (STRATTERA) 25 MG capsule Take 40 mg by mouth every morning.     atorvastatin (LIPITOR) 40 MG tablet Take 1 tablet (40 mg total) by mouth daily. 90 tablet 3   atorvastatin (LIPITOR) 40 MG tablet TAKE 1 TABLET(40 MG) BY MOUTH DAILY 90 tablet 0   Blood Pressure Monitoring (BLOOD PRESSURE CUFF) MISC 1 Device by Does not apply route once for 1 dose. 1 each 0   buPROPion (WELLBUTRIN XL) 150 MG 24 hr tablet Take 300 mg by mouth daily.     Docusate Sodium (COLACE PO) Take by mouth.     DULoxetine (CYMBALTA) 60 MG capsule Take 60 mg by mouth 2 (two) times daily.     finasteride (PROSCAR) 5 MG tablet Take 1 tablet by mouth daily.     FINASTERIDE PO Take by mouth. 0.5mg  daily  ibuprofen (ADVIL) 800 MG tablet Take 800 mg by mouth 3 (three) times daily.     levothyroxine (SYNTHROID) 50 MCG tablet Take 1 tablet by mouth every morning.     Multiple Vitamin (MULTIVITAMIN) capsule Take 1 capsule by mouth daily.     NASACORT ALLERGY 24HR 55 MCG/ACT AERO nasal inhaler USE 2-3 SPRAYS IN EACH NOSTRIL QD UTD     sildenafil (VIAGRA) 25 MG tablet Take 25 mg by mouth as needed.     tadalafil (CIALIS) 10 MG tablet Take 1 tablet (10 mg total) by mouth every other day as needed for erectile dysfunction. 30 tablet 1   tamsulosin (FLOMAX) 0.4 MG CAPS capsule Take 1 capsule (0.4 mg total) by mouth daily. (Patient not taking: Reported on 09/04/2022) 90 capsule 3   traZODone (DESYREL) 50 MG tablet Take 50 mg by mouth at bedtime as needed.     Vitamin D, Ergocalciferol, (DRISDOL) 1.25 MG (50000 UNIT) CAPS capsule Take 50,000 Units by mouth once a week.     No facility-administered medications prior to visit.       Objective:   Physical  Exam:  General appearance: 52 y.o., male, NAD, conversant  Eyes: anicteric sclerae; PERRL, tracking appropriately HENT: NCAT; MMM Neck: Trachea midline; no lymphadenopathy, no JVD Lungs: CTAB, no crackles, no wheeze, with normal respiratory effort CV: RRR, no murmur  Abdomen: Soft, non-tender; non-distended, BS present  Extremities: No peripheral edema, warm Skin: Normal turgor and texture; no rash Psych: Appropriate affect Neuro: Alert and oriented to person and place, no focal deficit     There were no vitals filed for this visit.   on *** LPM *** RA BMI Readings from Last 3 Encounters:  09/04/22 31.07 kg/m  06/28/22 32.30 kg/m  01/18/20 27.07 kg/m   Wt Readings from Last 3 Encounters:  09/04/22 242 lb (109.8 kg)  06/28/22 251 lb 9.6 oz (114.1 kg)  01/18/20 (!) 210 lb 12.8 oz (95.6 kg)     CBC    Component Value Date/Time   WBC 6.1 06/28/2022 0955   RBC 5.51 06/28/2022 0955   HGB 15.9 06/28/2022 0955   HCT 46.2 06/28/2022 0955   PLT 254 06/28/2022 0955   MCV 84 06/28/2022 0955   MCH 28.9 06/28/2022 0955   MCHC 34.4 06/28/2022 0955   RDW 13.5 06/28/2022 0955    ***  Chest Imaging: CT Chest 04/2019: stable RLL 38mm nodule relative to 12/2017, severe stenosis of left innominate vein, subclavian vein with extensive collaterals   Pulmonary Functions Testing Results:     No data to display            Echocardiogram:    1. Left ventricular ejection fraction, by estimation, is 60 to 65%. Left  ventricular ejection fraction by PLAX is 62 %. The left ventricle has  normal function. The left ventricle has no regional wall motion  abnormalities. There is severe asymmetric left   ventricular hypertrophy of the basal-septal segment. Left ventricular  diastolic parameters are consistent with Grade I diastolic dysfunction  (impaired relaxation).   2. Right ventricular systolic function is normal. The right ventricular  size is normal.   3. The mitral valve is  abnormal. Mild mitral valve regurgitation.   4. The aortic valve is tricuspid. Aortic valve regurgitation is not  visualized.    \ Assessment & Plan:    Plan:      Maryjane Hurter, MD [facility] 09/04/2022 9:44 AM

## 2022-09-04 NOTE — Patient Instructions (Signed)
Follow up Early May for MRI discussion Check B12  MRI brain

## 2022-09-04 NOTE — Progress Notes (Signed)
Assessment/Plan:   Mark Strong is a very pleasant 52 y.o. year old RH male with a history of hypertension, hyperlipidemia,  h/o SSS s/p PMP, PAF, CAD, vitamin D deficiency, sleep apnea, hypothyroidism, ADHD, GAD, depression, seen today for evaluation of memory loss.  In 2021, the patient had a neurocognitive testing showing evidence of mild dysfunction primarily involving the executive control, with etiology felt most likely due to modifiable risk factors and contributors such as chronic pain, multiple medical conditions, anxiety, and medication effect, in addition to his history of ADHD.  MoCA today is 28/30.  Latest neuropsychological evaluation at [facility] 10/23/2021 indicated reassuring findings, with normal performance on objective assessment, without patterns concerning for cognitive decline but consistent with attention and concentration deficit.  Giving his extensive cardiovascular history, it is felt prudent to complete the workup with imaging.    Memory Difficulties, of multiple etiologies  Attention and concentration deficit  MRI brain without contrast to assess for underlying structural abnormality and assess vascular load. He has dual-chamber Biotronik E. Luna 8 pacemaker, MRI compatible.   Continue to control mood as per PCP and BH/Psychiatry Recommend relaxation techniques Continue pain control at the pain clinic Continue CPAP for OSA Recommend good control of cardiovascular risk factors.  Follow-up with cardiology Check B12   Folllow up early May for discussion MRI results   Subjective:   The patient is here alone   How long did patient have memory difficulties?  He reports having memory difficulties at least dating back to 2020, but over the last 2 years, he feels that these difficulties are worse.  He reports that reads and does not recall the material, does not remember the speeches at church. Tries audio books and falls asleep.  Sometimes he does not remember  recent conversations with he is mother.  He is concerned that his long-term memory may be affected as well, as he may experience difficulty remembering things from the remote past.  He is also concerned because he needs more time to think and answer. repeats oneself?  Denies Disoriented when walking into a room?  Patient denies.  Leaving objects in unusual places?  He reports one time leaving dishes in the fridge but he caught himself. May find himself more distracted   Wandering behavior?  denies   Any personality changes since last visit?  Patient denies   Any history of depression?:  Patient denies   Hallucinations or paranoia?  Patient denies hallucinations  Seizures?   Patient denies    Any sleep changes? Sleeps well but has a "hard time using the CPAP ", he is considering having a repeat study for other devices options. A couple of times he had "premonitions during the dreams". denies REM behavior or sleepwalking   Sleep apnea? Uses CPAP although he finds it difficult to use  Any hygiene concerns?  Patient denies   Independent of bathing and dressing?  Endorsed  Does the patient needs help with medications? Patient is in charge   Who is in charge of the finances?  Patient is in charge     Any changes in appetite? Lately he skips meals, because he is not hungry  Patient have trouble swallowing? denies   Does the patient cook?  No issues Any kitchen accidents such as leaving the stove on? denies   Any headaches?   denies   Chronic pain?  Endorsed, he has chronic pain of the back, neck,feet, knees and wrist, and sees   Dr. Mina Marble /  Ortho  Ambulates with difficulty?  denies   Recent falls or head injuries?  Not recently, in the past he fell off the ladder, injuring several bones, followed by orthopedics, and is requiring injections Vision changes? denies   Unilateral weakness, numbness or tingling? denies   Any tremors? "  got better, they are almost not there"  no intention tremors (never  took propranolol because there were no debilitating) Any anosmia?  denies   Any incontinence of urine? denies   Any bowel dysfunction?  denies      Patient lives  by himself  History of heavy alcohol intake? denies   History of heavy tobacco use?  denies   Family history of dementia? Father had dementia of unknown etiology Does patient drive? He does not drive because of performance anxiety.   Recent labs T4 1.07, TSH 2.98 normal CMP, normal CBC, vitamin D was 23 in the recent past, he is now on replenishment  Past Medical History:  Diagnosis Date   Memory loss    Pacemaker    PVNS (pigmented villonodular synovitis)    Sciatica    Sleep apnea    Thyroid disease    Varicose veins of right leg with edema      Past Surgical History:  Procedure Laterality Date   CARDIAC CATHETERIZATION     left and right    INSERT / REPLACE / REMOVE PACEMAKER     Biotronik, dual-chamber, 05/18/2016 for SSS   Right greater saphenous vein ablation Right      Allergies  Allergen Reactions   Betadine [Povidone Iodine] Rash    Current Outpatient Medications  Medication Instructions   aspirin EC 81 mg, Oral,  Once   atomoxetine (STRATTERA) 40 mg, Oral, Every morning   atorvastatin (LIPITOR) 40 MG tablet TAKE 1 TABLET(40 MG) BY MOUTH DAILY   atorvastatin (LIPITOR) 40 mg, Oral, Daily   Blood Pressure Monitoring (BLOOD PRESSURE CUFF) MISC 1 Device, Does not apply,  Once   buPROPion (WELLBUTRIN XL) 300 mg, Oral, Daily   Docusate Sodium (COLACE PO) Oral   DULoxetine (CYMBALTA) 60 mg, Oral, 2 times daily   finasteride (PROSCAR) 5 MG tablet 1 tablet, Oral, Daily   FINASTERIDE PO Oral, 0.5mg  daily   ibuprofen (ADVIL) 800 mg, Oral, 3 times daily   levothyroxine (SYNTHROID) 50 MCG tablet 1 tablet, Oral, Every morning   Multiple Vitamin (MULTIVITAMIN) capsule 1 capsule, Oral, Daily   NASACORT ALLERGY 24HR 55 MCG/ACT AERO nasal inhaler USE 2-3 SPRAYS IN EACH NOSTRIL QD UTD   sildenafil (VIAGRA) 25 mg,  Oral, As needed   tadalafil (CIALIS) 10 mg, Oral, Every 48 hours PRN   tamsulosin (FLOMAX) 0.4 mg, Oral, Daily   traZODone (DESYREL) 50 mg, Oral, At bedtime PRN   Vitamin D (Ergocalciferol) (DRISDOL) 50,000 Units, Oral, Weekly     VITALS:   Vitals:   09/04/22 0754  BP: 105/72  Pulse: 77  Resp: 20  SpO2: 99%  Weight: 242 lb (109.8 kg)  Height: 6\' 2"  (1.88 m)       PHYSICAL EXAM   HEENT:  Normocephalic, atraumatic. The mucous membranes are moist. The superficial temporal arteries are without ropiness or tenderness. Cardiovascular: Regular rate and rhythm. Lungs: Clear to auscultation bilaterally. Neck: There are no carotid bruits noted bilaterally.  NEUROLOGICAL:    09/04/2022    9:00 AM 09/01/2019    9:00 AM  Montreal Cognitive Assessment   Visuospatial/ Executive (0/5) 4 3  Naming (0/3) 3 3  Attention: Read  list of digits (0/2) 2 2  Attention: Read list of letters (0/1) 1 1  Attention: Serial 7 subtraction starting at 100 (0/3) 3 3  Language: Repeat phrase (0/2) 2 2  Language : Fluency (0/1) 1 1  Abstraction (0/2) 1 2  Delayed Recall (0/5) 4 5  Orientation (0/6) 6 6  Total 27 28  Adjusted Score (based on education) 28 29        No data to display           Orientation:  Alert and oriented to person, place and time. No aphasia or dysarthria. Fund of knowledge is appropriate. Recent memory impaired and remote memory intact.  Attention and concentration are reduced.  Able to name objects and repeat phrases. Delayed recall 4 of 5 Cranial nerves: There is good facial symmetry.  Anxious appearing.  Extraocular muscles are intact and visual fields are full to confrontational testing. Speech is fluent and clear. No tongue deviation. Hearing is intact to conversational tone. Tone: Tone is good throughout. Sensation: Sensation is intact to light touch and pinprick throughout. Vibration is intact at the bilateral big toe.There is no extinction with double simultaneous  stimulation. There is no sensory dermatomal level identified. Coordination: The patient has no difficulty with RAM's or FNF bilaterally. Normal finger to nose  Motor: Strength is 5/5 in the bilateral upper and lower extremities. There is no pronator drift. There are no fasciculations noted. Abnormal movements: Very minimal tremors on the left hand at rest, not on intention.  (He reports that this is improved with changing meds)   DTR's: Deep tendon reflexes are 2/4 at the bilateral biceps, triceps, brachioradialis, patella and achilles.  Plantar responses are downgoing bilaterally. Gait and Station: The patient is able to ambulate without difficulty.The patient is able to heel toe walk without any difficulty.The patient is able to ambulate in a tandem fashion. The patient is able to stand in the Romberg position.     Thank you for allowing [place] the opportunity to participate in the care of this nice patient. Please do not hesitate to contact us for any questions or concerns.   Total time spent on today's visit was 60 minutes dedicated to this patient today, preparing to see patient, examining the patient, ordering tests and/or medications and counseling the patient, documenting clinical information in the EHR or other health record, independently interpreting results and communicating results to the patient/family, discussing treatment and goals, answering patient's questions and coordinating care.  Cc:  Corliss Blacker, MD  Sharene Butters 09/04/2022 9:19 AM

## 2022-09-04 NOTE — Progress Notes (Signed)
vitamin B12 is low.  Recommend starting on vitamin B12 1000 mcg daily.  Follow-up with PCP.  Thank you

## 2022-09-05 ENCOUNTER — Ambulatory Visit: Payer: Medicare HMO | Admitting: Student

## 2022-09-05 ENCOUNTER — Telehealth: Payer: Self-pay | Admitting: Emergency Medicine

## 2022-09-05 ENCOUNTER — Encounter: Payer: Self-pay | Admitting: Student

## 2022-09-05 VITALS — BP 126/80 | HR 75 | Temp 97.7°F | Ht 74.0 in | Wt 242.6 lb

## 2022-09-05 DIAGNOSIS — G4733 Obstructive sleep apnea (adult) (pediatric): Secondary | ICD-10-CM | POA: Diagnosis not present

## 2022-09-05 DIAGNOSIS — R6889 Other general symptoms and signs: Secondary | ICD-10-CM | POA: Diagnosis not present

## 2022-09-05 DIAGNOSIS — F411 Generalized anxiety disorder: Secondary | ICD-10-CM | POA: Diagnosis not present

## 2022-09-05 DIAGNOSIS — F900 Attention-deficit hyperactivity disorder, predominantly inattentive type: Secondary | ICD-10-CM | POA: Diagnosis not present

## 2022-09-05 DIAGNOSIS — R0609 Other forms of dyspnea: Secondary | ICD-10-CM | POA: Diagnosis not present

## 2022-09-05 DIAGNOSIS — F330 Major depressive disorder, recurrent, mild: Secondary | ICD-10-CM | POA: Diagnosis not present

## 2022-09-05 LAB — BRAIN NATRIURETIC PEPTIDE: Pro B Natriuretic peptide (BNP): 13 pg/mL (ref 0.0–100.0)

## 2022-09-05 MED ORDER — ALBUTEROL SULFATE HFA 108 (90 BASE) MCG/ACT IN AERS: 2.0000 | INHALATION_SPRAY | Freq: Four times a day (QID) | RESPIRATORY_TRACT | 6 refills | Status: AC | PRN

## 2022-09-05 MED ORDER — ZOLPIDEM TARTRATE 5 MG PO TABS: 5.0000 mg | ORAL_TABLET | Freq: Every evening | ORAL | 2 refills | Status: DC | PRN

## 2022-09-05 NOTE — Patient Instructions (Addendum)
-   labs today - try albuterol 1-2 puffs up to 4 times daily as needed and can try 5-20 minutes before exercise - sleep study you'll be called to schedule - don't wear cpap when you do your home sleep study - ambien 5 mg nightly right before bed - will send message to Dr. Sallyanne Kuster to see if he thinks we may need to adjust your pacemaker's rate responsiveness settings - PFTs (breathing tests next visit) - if still can't explain trouble breathing then consideration of stress test and maybe an ultrasound of your neck and upper extremity blood vessels

## 2022-09-05 NOTE — Telephone Encounter (Signed)
-----   Message from Sanda Klein, MD sent at 09/05/2022  1:15 PM EDT ----- Regarding: RE: SSS Any one of those is fine ----- Message ----- From: Sharee Holster, RN Sent: 09/05/2022  12:01 PM EDT To: Sanda Klein, MD; Maryjane Hurter, MD Subject: RE: SSS                                        Clarification; one opening 09/06/22 at 0820 and several openings on Monday 09/10/22 ----- Message ----- From: Sanda Klein, MD Sent: 09/05/2022  11:42 AM EDT To: Maryjane Hurter, MD; Sharee Holster, RN Subject: RE: SSS                                        Yes, I can work on that!  Katie - do I have any openings on a device day coming up? ----- Message ----- From: Maryjane Hurter, MD Sent: 09/05/2022  11:10 AM EDT To: Sanda Klein, MD Subject: SSS                                            After working him out pretty hard in the clinic with laps and air squats HR only went from 70 to 80 before he said he needed to stop due to dyspnea. Do you think there's any need to adjust his rate responsiveness settings on his pacemaker?  Thanks!! Nate

## 2022-09-05 NOTE — Telephone Encounter (Signed)
Called pt to set up an appointment with Dr Sallyanne Kuster. Needs Device Day appointment soon to adjust pacemaker settings/ device check.  No answer. Left message and call back number

## 2022-09-06 ENCOUNTER — Ambulatory Visit: Payer: Medicare HMO | Admitting: Psychology

## 2022-09-12 ENCOUNTER — Ambulatory Visit (INDEPENDENT_AMBULATORY_CARE_PROVIDER_SITE_OTHER): Payer: Medicare HMO | Admitting: Student

## 2022-09-12 DIAGNOSIS — R6889 Other general symptoms and signs: Secondary | ICD-10-CM | POA: Diagnosis not present

## 2022-09-12 DIAGNOSIS — R0609 Other forms of dyspnea: Secondary | ICD-10-CM | POA: Diagnosis not present

## 2022-09-12 LAB — PULMONARY FUNCTION TEST
DL/VA % pred: 108 %
DL/VA: 4.73 ml/min/mmHg/L
DLCO cor % pred: 108 %
DLCO cor: 35.72 ml/min/mmHg
DLCO unc % pred: 108 %
DLCO unc: 35.72 ml/min/mmHg
FEF 25-75 Post: 6.19 L/sec
FEF 25-75 Pre: 5.13 L/sec
FEF2575-%Change-Post: 20 %
FEF2575-%Pred-Post: 162 %
FEF2575-%Pred-Pre: 134 %
FEV1-%Change-Post: 5 %
FEV1-%Pred-Post: 110 %
FEV1-%Pred-Pre: 103 %
FEV1-Post: 4.87 L
FEV1-Pre: 4.6 L
FEV1FVC-%Change-Post: 0 %
FEV1FVC-%Pred-Pre: 105 %
FEV6-%Change-Post: 6 %
FEV6-%Pred-Post: 106 %
FEV6-%Pred-Pre: 100 %
FEV6-Post: 5.9 L
FEV6-Pre: 5.55 L
FEV6FVC-%Pred-Post: 103 %
FEV6FVC-%Pred-Pre: 103 %
FVC-%Change-Post: 5 %
FVC-%Pred-Post: 103 %
FVC-%Pred-Pre: 97 %
FVC-Post: 5.91 L
FVC-Pre: 5.6 L
Post FEV1/FVC ratio: 82 %
Post FEV6/FVC ratio: 100 %
Pre FEV1/FVC ratio: 82 %
Pre FEV6/FVC Ratio: 100 %
RV % pred: 96 %
RV: 2.2 L
TLC % pred: 101 %
TLC: 7.92 L

## 2022-09-12 NOTE — Patient Instructions (Signed)
Full PFT Performed Today  

## 2022-09-12 NOTE — Progress Notes (Signed)
Full PFT Performed Today  

## 2022-09-13 ENCOUNTER — Ambulatory Visit: Payer: Medicare HMO

## 2022-09-13 DIAGNOSIS — G4733 Obstructive sleep apnea (adult) (pediatric): Secondary | ICD-10-CM

## 2022-09-13 DIAGNOSIS — R6889 Other general symptoms and signs: Secondary | ICD-10-CM | POA: Diagnosis not present

## 2022-09-14 DIAGNOSIS — R6889 Other general symptoms and signs: Secondary | ICD-10-CM | POA: Diagnosis not present

## 2022-09-17 DIAGNOSIS — M5416 Radiculopathy, lumbar region: Secondary | ICD-10-CM | POA: Diagnosis not present

## 2022-09-18 ENCOUNTER — Telehealth: Payer: Self-pay | Admitting: Pulmonary Disease

## 2022-09-18 DIAGNOSIS — G4733 Obstructive sleep apnea (adult) (pediatric): Secondary | ICD-10-CM

## 2022-09-18 NOTE — Telephone Encounter (Signed)
Call patient  Sleep study result  Date of study: 09/13/2022  Impression: Moderate obstructive sleep apnea Mild oxygen desaturations  Recommendation:  CPAP therapy may be appropriate for treatment Auto CPAP 5-15 will be appropriate  Other methods of treatment may be considered including an inspire device as discussed with the patient during his last visit -He should be aware that this will not resolve insomnia as this may be a separate problem unrelated to CPAP use  Follow-up as previously scheduled

## 2022-09-21 ENCOUNTER — Other Ambulatory Visit (HOSPITAL_COMMUNITY): Payer: Self-pay | Admitting: Physical Medicine and Rehabilitation

## 2022-09-21 DIAGNOSIS — M5416 Radiculopathy, lumbar region: Secondary | ICD-10-CM

## 2022-09-21 NOTE — Telephone Encounter (Signed)
Can you make referral to Christia Reading at [facility] for [facility] device workup?

## 2022-09-21 NOTE — Telephone Encounter (Signed)
Referral order placed. Nothing further needed

## 2022-09-24 ENCOUNTER — Ambulatory Visit (INDEPENDENT_AMBULATORY_CARE_PROVIDER_SITE_OTHER): Payer: Medicare HMO | Admitting: Psychology

## 2022-09-24 DIAGNOSIS — F900 Attention-deficit hyperactivity disorder, predominantly inattentive type: Secondary | ICD-10-CM

## 2022-09-24 NOTE — Progress Notes (Signed)
PROGRESS NOTES:  Name: Mark Strong Date: 09/24/2022 MRN: [id number] DOB: [birth year] PCP: Joya Martyr, MD (Inactive)  Time Spent: Start: 8:00 am End:  8:55 am   Today I met with  Mark Strong in remote video ([facility]) face-to-face individual psychotherapy.  Distance Site: Client's Home Orginating Site: Dr Odette Horns Remote Office Consent: Obtained verbal consent to transmit  session remotely        Individualized Treatment Plan               Strengths: motivated, engaged, curious  Supports: family, neighbors and church members   Goal/Needs for Treatment:  In order of importance to patient 1) Learn and implement relapse prevention skills that lead to the alleviation and help prevent the relapse of depression. 2) Learn and implement coping skills that result in a reduction of anxiety and worry, and improved daily functioning. 3)  Engage in social, productive, and recreational activities that are possible in spite of medical condition. 4) Comply with the medication regimen and necessary medical procedures, reporting any side effects or problems to physicians or therapists.  5) Learn and implement coping skills to reduce the disruptive influence of distractibility  and forgetfulness to improved daily functioning.        Client Statement of Needs: states he needs assistance with his depression, anxiety and ADHD.     Treatment Level: Bi-weekly Individual Outpatient Psychotherapy  Symptoms: A diagnosis of a chronic illness that is not life-threatening, but necessitates changes in living.  Autonomic hyperactivity (e.g., palpitations, shortness of breath, dry mouth, trouble swallowing, nausea, diarrhea).   Childhood history of Attention Deficit Disorder (ADD) that was either diagnosed or later concluded due to the symptoms of behavioral problems at school, impulsivity, temper outbursts, and lack of concentration.  Depressed or irritable mood.   Diminished  interest in or enjoyment of activities.  Disorganized in most areas of his/her life.  Easily distracted and drawn from task at hand.  Excessive and/or unrealistic worry that is difficult to control occurring more days than not for at least 6 months about a number of events or activities. Feelings of hopelessness, worthlessness, or inappropriate guilt. Lack of energy.  Low self-esteem.  Psychological or behavioral factors that influence the course of the medical condition.  Psychomotor agitation or retardation.   Sleeplessness or hypersomnia.   Social withdrawal.   Unable to concentrate or pay attention to things of low interest, even when those things are important to his/her life.   Client Treatment Preferences: Continue with present therapist   Healthcare consumer's goal for treatment:  Psychologist, Hilma Favors, PhD will support the patient's ability to achieve the goals identified. Cognitive Behavioral Therapy, Dialectical Behavioral Therapy, Motivational Interviewing, organizational and time management skills, and other evidenced-based practices will be used to promote progress towards healthy functioning.   Healthcare consumer Mark Strong will: Actively participate in therapy, working towards healthy functioning.    *Justification for Continuation/Discontinuation of Goal: R=Revised, O=Ongoing, A=Achieved, D=Discontinued  Goal 1) Learn and implement relapse prevention skills that lead to the alleviation and help prevent the relapse of depression. . 5 Point Likert rating baseline date: 10/20/2021  Target Date Goal Was reviewed Status Code Progress towards goal/Likert rating  11/08/2022 11/07/2021          O 3/5 - pt has introduced new lifestyle known to reduce depression             Goal 2) Learn and implement coping skills that result in a reduction of anxiety and  worry, and improved daily functioning.     5 Point Likert rating baseline date: 10/20/2021  Target Date Goal  Was reviewed Status Code Progress towards goal/Likert rating  11/08/2022 11/07/2021           O 2/5 - pt implements coping skills inconsistently             Goal 3) Engage in social, productive, and recreational activities that are possible in spite of medical condition. 5 Point Likert rating baseline date: 10/20/2021  Target Date Goal Was reviewed Status Code Progress towards goal/Likert rating  11/08/2022 11/07/2021          O 2/5 - pt is making connections with neighbors and periodically attend church/church events             Goal 4) Comply with the medication regimen and necessary medical procedures, reporting any side effects or problems to  physicians or therapists.  5 Point Likert rating baseline date: 10/20/2021   Target Date Goal Was reviewed Status Code Progress towards goal/Likert rating  11/08/2022 11/07/2021           O 5/5 - pt is compliant in taking medications as prescribed              Goal  5 Point Likert rating baseline date: 10/20/2021  Target Date Goal Was reviewed Status Code Progress towards goal/Likert rating  11/08/2022 11/07/2021           O 2/5 - pt inconsistently implements skills known to reduce distractibility and forgetfulness               This plan has been reviewed and created by the following participants:  This plan will be reviewed at least every 12 months. Date Behavioral Health Clinician Date Guardian/Patient   11/07/2021 Hilma Favors, PhD  11/07/2021 Mark Strong                   Diagnosis: Attention-deficit/hyperactivity disorder, Predominately inattentive presentation Generalized Anxiety Disorder  Major Depressive Disorder, recurrent, mild   Depression Rating: 2 Anxiety Rating: 1-2   Mark Strong reports that he has been in the process of helping his mother decide on an assisted living facility.  He shared that his mother will be coming to [facility] for a 6 month stay to visit some assisted living facilities here and in [place].  We  d/e/p that he is looking forward to spending time with her mother, reasons why he would prefer for her to live in [place] and complicating family dynamics.  Mark Strong states that he continues to do well with his new ADHD medication.  His doctor has ordered an MRI of his brain to have a closer look at his vascular health and ensure this isn't a factor in his cognitive issues.  I reviewed my vacation schedule with Mark Strong and confirmed his next appointment on May 23rd.    Hilma Favors, PhD  [facility] - psychiatric PA, Mark Strong

## 2022-09-25 ENCOUNTER — Other Ambulatory Visit (HOSPITAL_COMMUNITY): Payer: Self-pay | Admitting: Physical Medicine and Rehabilitation

## 2022-09-25 DIAGNOSIS — M5416 Radiculopathy, lumbar region: Secondary | ICD-10-CM

## 2022-09-27 ENCOUNTER — Ambulatory Visit: Payer: Medicare HMO | Attending: Cardiovascular Disease | Admitting: Cardiovascular Disease

## 2022-09-27 ENCOUNTER — Encounter: Payer: Self-pay | Admitting: Cardiovascular Disease

## 2022-09-27 VITALS — BP 130/91 | HR 98 | Ht 73.0 in | Wt 251.0 lb

## 2022-09-27 DIAGNOSIS — E039 Hypothyroidism, unspecified: Secondary | ICD-10-CM | POA: Diagnosis not present

## 2022-09-27 DIAGNOSIS — Z79899 Other long term (current) drug therapy: Secondary | ICD-10-CM

## 2022-09-27 DIAGNOSIS — I480 Paroxysmal atrial fibrillation: Secondary | ICD-10-CM | POA: Diagnosis not present

## 2022-09-27 DIAGNOSIS — E7800 Pure hypercholesterolemia, unspecified: Secondary | ICD-10-CM | POA: Diagnosis not present

## 2022-09-27 DIAGNOSIS — I495 Sick sinus syndrome: Secondary | ICD-10-CM

## 2022-09-27 DIAGNOSIS — G4733 Obstructive sleep apnea (adult) (pediatric): Secondary | ICD-10-CM | POA: Diagnosis not present

## 2022-09-27 DIAGNOSIS — Z950 Presence of cardiac pacemaker: Secondary | ICD-10-CM | POA: Diagnosis not present

## 2022-09-27 DIAGNOSIS — E538 Deficiency of other specified B group vitamins: Secondary | ICD-10-CM | POA: Diagnosis not present

## 2022-09-27 DIAGNOSIS — R6889 Other general symptoms and signs: Secondary | ICD-10-CM | POA: Diagnosis not present

## 2022-09-27 NOTE — Progress Notes (Signed)
Cardiology Office Note:    Date:  09/29/2022   ID:  [name], DOB Oct 29, 1970, MRN [id number]  PCP:  Joya Martyr, MD (Inactive)  Cardiologist:  None  Electrophysiologist:  None   Referring MD: Joya Martyr, MD   Chief Complaint  Patient presents with   Fatigue    History of Present Illness:    Jahseh Lucchese is a 52 y.o. 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  He received a dual-chamber permanent pacemaker in 2017 in [place].  He has mild nonobstructive CAD by coronary CT angiography performed in [place] in Nov 2021.  He returns with complaints of fatigue and exertional dyspnea.  Although rate response with CLS is programmed on his device and his heart rate histogram distribution appears fair, when he was in the pulmonologist office to evaluate his dyspnea, his pulmonologist made him perform some exercises and his heart rate remained in the 70s.  He was therefore referred to readjust his rate response settings.  An echocardiogram performed 07/24/2022 shows normal findings other than a mildly dilated left atrium.  The official report states that he has "stage I diastolic dysfunction" but his mitral annulus diastolic velocities are pretty normal in the E/e' ratio is quite low.  On the other hand, his official report states that his left atrium is normal in size when in fact it is mildly dilated.  He reports that he conscientiously uses his CPAP and denies daytime hypersomnolence.  His levothyroxine dose has been adjusted and his TSH is now in normal range.  [name] remains mildly obese and has actually gained a little weight with a BMI that is now up to 33.  Back in 2021 his weight was only around 170-180 pounds and he now weighs about 75 pounds more than that.  This is reflected in a worse lipid profile, although he is taking high-dose of statin.  He does not have diabetes mellitus.  He does  not smoke.  He was recently diagnosed with B12 deficiency and is about to start taking supplements.  He has erectile dysfunction that responds to PDE 5 inhibitors.  He moved to [facility] for couple of years to take care of his ailing father, who unfortunately has passed away.  While he was in [place] he had an episode of chest pain and was referred for coronary CT angiography.  This showed widespread atherosclerotic plaque with 25% stenoses in all the major coronary territories and a 25-49% stenosis in the mid LAD artery.  The formal calcium score was not calculated.  [name] does not remember complaining of chest pain, but did have some shortness of breath.  He did not have an echocardiogram performed at that time.  His dual-chamber Biotronik E. Luna 8 pacemaker was implanted in 2017.  The device is functioning normally in DDDR-CLS mode.  Generator and lead parameters are excellent.  He has very rare and brief episodes of paroxysmal atrial tachycardia with 1: 1 AV conduction generally under 10 seconds in duration.  He had a single episode of paroxysmal atrial fibrillation lasting for 36 seconds in March 2023, without recurrence since then.  Previously his device had reported a 5-minute episode of atrial fibrillation in May 2021.  The heart rate histogram distribution appears appropriate.  On atorvastatin.  Uses Cialis for ED.  Compliant with CPAP for sleep apnea.  Has chronic pain and receives chronic buprenorphine.  Takes appropriate hormone supplements for hypothyroidism and allopurinol for hyperuricemia.  Prior to pacemaker implantation he had normal coronary angiography in 2016 and EF 55%.  Minor CAD by coronary CT angiography in [place] in Nov 2021.  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  He has a history of endovenous laser ablation of the right greater saphenous vein.    Past Medical History:   Diagnosis Date   Memory loss    Pacemaker    PVNS (pigmented villonodular synovitis)    Sciatica    Sleep apnea    Thyroid disease    Varicose veins of right leg with edema     Past Surgical History:  Procedure Laterality Date   CARDIAC CATHETERIZATION     left and right    INSERT / REPLACE / REMOVE PACEMAKER     Biotronik, dual-chamber, 05/18/2016 for SSS   Right greater saphenous vein ablation Right     Current Medications: Current Meds  Medication Sig   albuterol (VENTOLIN HFA) 108 (90 Base) MCG/ACT inhaler Inhale 2 puffs into the lungs every 6 (six) hours as needed for wheezing or shortness of breath.   aspirin EC 81 MG tablet Take 81 mg by mouth once.   atomoxetine (STRATTERA) 40 MG capsule Take 40 mg by mouth daily.   atorvastatin (LIPITOR) 40 MG tablet Take 1 tablet (40 mg total) by mouth daily.   BD DISP NEEDLES 25G X 5/8" MISC    Blood Pressure Monitoring (BLOOD PRESSURE CUFF) MISC 1 Device by Does not apply route once for 1 dose.   buPROPion (WELLBUTRIN XL) 300 MG 24 hr tablet Take 300 mg by mouth daily.   cyanocobalamin (VITAMIN B12) 1000 MCG/ML injection Inject 1,000 mcg into the muscle every 30 (thirty) days.   Docusate Sodium (COLACE PO) Take by mouth.   DULoxetine (CYMBALTA) 60 MG capsule Take 60 mg by mouth 2 (two) times daily.   finasteride (PROSCAR) 5 MG tablet Take 1 tablet by mouth daily.   ibuprofen (ADVIL) 800 MG tablet Take 800 mg by mouth 3 (three) times daily.   levothyroxine (SYNTHROID) 50 MCG tablet Take 1 tablet by mouth every morning.   sildenafil (VIAGRA) 25 MG tablet Take 25 mg by mouth as needed.   traZODone (DESYREL) 50 MG tablet Take 50 mg by mouth at bedtime as needed.   Vitamin D, Ergocalciferol, (DRISDOL) 1.25 MG (50000 UNIT) CAPS capsule Take 50,000 Units by mouth once a week.   zolpidem (AMBIEN) 5 MG tablet Take 1 tablet (5 mg total) by mouth at bedtime as needed for sleep.     Allergies:   Betadine [povidone iodine]   Social History    Socioeconomic History   Marital status: Single    Spouse name: Not on file   Number of children: 0   Years of education: 12   Highest education level: 12th grade  Occupational History   Not on file  Tobacco Use   Smoking status: Never    Passive exposure: Past   Smokeless tobacco: Never  Vaping Use   Vaping Use: Never used  Substance and Sexual Activity   Alcohol use: Not Currently   Drug use: Not Currently   Sexual activity: Yes  Other Topics Concern   Not on file  Social History Narrative   Right handed   Sugar free    Lives alone   Disabled    One floor home   Social Determinants of Health   Financial Resource Strain:  Not on file  Food Insecurity: Not on file  Transportation Needs: Not on file  Physical Activity: Not on file  Stress: Not on file  Social Connections: Not on file     Family History: The patient's family history includes Arthritis in his father and mother; COPD in his father; Cancer in his brother; Depression in his mother; Diabetes in his father; Heart disease in his mother; Hyperlipidemia in his father; Hypertension in his father; Kidney disease in his father; Thyroid disease in his father.  ROS:   Please see the history of present illness.     All other systems reviewed and are negative.  EKGs/Labs/Other Studies Reviewed:    The following studies were reviewed today: Comprehensive pacemaker check performed in the office today.  Capture thresholds are normal.  Generator is at roughly 50% battery life. Increase the max sensor rate to 140 bpm (to do this had to decrease the PVARP to 250 ms), turned off the "cap" on CLS.  PFTs on 09/12/2022:      Component Ref Range & Units 2 wk ago  FVC-Pre L 5.60  FVC-%Pred-Pre % 97  FVC-Post L 5.91  FVC-%Pred-Post % 103  FVC-%Change-Post % 5  FEV1-Pre L 4.60  FEV1-%Pred-Pre % 103  FEV1-Post L 4.87  FEV1-%Pred-Post % 110  FEV1-%Change-Post % 5  FEV6-Pre L 5.55  FEV6-%Pred-Pre % 100   FEV6-Post L 5.90  FEV6-%Pred-Post % 106  FEV6-%Change-Post % 6  Pre FEV1/FVC ratio % 82  FEV1FVC-%Pred-Pre % 105  Post FEV1/FVC ratio % 82  FEV1FVC-%Change-Post % 0  Pre FEV6/FVC Ratio % 100  FEV6FVC-%Pred-Pre % 103  Post FEV6/FVC ratio % 100  FEV6FVC-%Pred-Post % 103  FEF 25-75 Pre L/sec 5.13  FEF2575-%Pred-Pre % 134  FEF 25-75 Post L/sec 6.19  FEF2575-%Pred-Post % 162  FEF2575-%Change-Post % 20  RV L 2.20  RV % pred % 96  TLC L 7.92  TLC % pred % 101  DLCO unc ml/min/mmHg 35.72  DLCO unc % pred % 108  DLCO cor ml/min/mmHg 35.72  DLCO cor % pred % 108  DL/VA ml/min/mmHg/L 4.40  DL/VA % pred % 102    Echocardiogram 07/24/2022:     1. Left ventricular ejection fraction, by estimation, is 60 to 65%. Left  ventricular ejection fraction by PLAX is 62 %. The left ventricle has  normal function. The left ventricle has no regional wall motion  abnormalities. There is severe asymmetric left   ventricular hypertrophy of the basal-septal segment. Left ventricular  diastolic parameters are consistent with Grade I diastolic dysfunction  (impaired relaxation).   2. Right ventricular systolic function is normal. The right ventricular  size is normal.   3. The mitral valve is abnormal. Mild mitral valve regurgitation.   4. The aortic valve is tricuspid. Aortic valve regurgitation is not  visualized.   Comparison(s): No significant change from prior study. 01/20/2020: LVEF  60-65%, mild MR.   On my review, there was evidence of mild left atrial dilation and there is no evidence of diastolic dysfunction  MV Decel Time: 264 msec      TR Vmax:        215.00 cm/s  MV E velocity: 58.20 cm/s    Estimated RAP:  3.00 mmHg  MV A velocity: 58.20 cm/s    RVSP:           21.5 mmHg  MV E/A ratio:  1.00  LV e' medial:    6.09 cm/s  LV E/e' medial:  9.6  LV  e' lateral:   9.03 cm/s  LV E/e' lateral: 6.4   LA diam:        4.20 cm 1.75 cm/m    LA Vol (A2C):   76.4  ml 31.88 ml/m   LA Vol (A4C):   57.6 ml 24.04 ml/m   LA Biplane Vol: 68.0 ml 28.38 ml/m    EKG:  EKG is not ordered today.  Intracardiac electrogram shows atrial paced, ventricular sensed rhythm.  Reviewed the tracing from 06/28/2022 which shows atrial paced, ventricular sensed rhythm with left axis deviation, otherwise normal tracing.  QTc 434 ms.  Recent Labs: 06/28/2022: ALT 17; BUN 12; Creatinine, Ser 1.21; Hemoglobin 15.9; Platelets 254; Potassium 4.3; Sodium 141 09/05/2022: Pro B Natriuretic peptide (BNP) 13.0 09/27/2022: TSH 1.050  Labs from care everywhere  09/28/2021 TSH 1.799, free T40.62, negative thyroid peroxidase antibodies Vitamin D 25-hydroxy 23 (low) 12/11/2021 Creatinine 1.02, potassium 4.7  Recent Lipid Panel    Component Value Date/Time   CHOL 167 09/27/2022 1309   TRIG 138 09/27/2022 1309   HDL 58 09/27/2022 1309   CHOLHDL 2.9 09/27/2022 1309   LDLCALC 85 09/27/2022 1309  09/28/2021 Cholesterol 153, triglycerides 90, HDL 56, LDL 79  Physical Exam:    VS:  BP (!) 130/91   Pulse 98   Ht 6\' 1"  (1.854 m)   Wt 251 lb (113.9 kg)   SpO2 97%   BMI 33.12 kg/m     Wt Readings from Last 3 Encounters:  09/27/22 251 lb (113.9 kg)  09/05/22 242 lb 9.6 oz (110 kg)  09/04/22 242 lb (109.8 kg)     General: Alert, oriented x3, no distress, mildly obese.  Healthy left subclavian pacemaker site Head: no evidence of trauma, PERRL, EOMI, no exophtalmos or lid lag, no myxedema, no xanthelasma; normal ears, nose and oropharynx Neck: normal jugular venous pulsations and no hepatojugular reflux; brisk carotid pulses without delay and no carotid bruits Chest: clear to auscultation, no signs of consolidation by percussion or palpation, normal fremitus, symmetrical and full respiratory excursions Cardiovascular: normal position and quality of the apical impulse, regular rhythm, normal first and second heart sounds, no murmurs, rubs or gallops Abdomen: no tenderness or  distention, no masses by palpation, no abnormal pulsatility or arterial bruits, normal bowel sounds, no hepatosplenomegaly Extremities: no clubbing, cyanosis or edema; 2+ radial, ulnar and brachial pulses bilaterally; 2+ right femoral, posterior tibial and dorsalis pedis pulses; 2+ left femoral, posterior tibial and dorsalis pedis pulses; no subclavian or femoral bruits Neurological: grossly nonfocal Psych: Normal mood and affect     ASSESSMENT:    1. SSS (sick sinus syndrome)   2. PAF (paroxysmal atrial fibrillation)   3. Pacemaker   4. OSA (obstructive sleep apnea)   5. Hypercholesterolemia   6. Acquired hypothyroidism   7. B12 deficiency   8. Medication management      PLAN:    In order of problems listed above:  SSS: He complains of fatigue and mild exertional dyspnea.  No significant structural/functional abnormalities on his echocardiogram and normal PFTs on 09/12/2022 I think part of this is simply related to substantial weight gain compared to 2 years ago when he weighed about 70 pounds less.  He has been exercising less.  Vitamin B12 deficiency may be playing a role, although he is not overtly anemic.  However, his pulmonologist did not document a less than expected increase in heart rate with physical exertion so we made some changes to his rate response settings today including  increasing the upper sensor rate to 140 bpm and making the heart rate increase with CLS more liberal by taking off the "cap".  Reevaluate in a few months.  There is no evidence of significant structural heart disease on his recent echocardiogram and I think he has normal systolic and diastolic function. AFib: Extremely low burden, less than 1 minute in the last 12 months.  Also has occasional brief episodes of nonsustained atrial tachycardia with 1: 1 AV conduction.  Even if we consider the findings on his coronary CT angiogram to represent coronary disease, his CHA2DS2-VASc score is at most 1.  I think it  is appropriate for him to take aspirin because of the findings on the coronary angiograms, but I do not think he needs anticoagulation at this point. The arrhythmia may be related to his underlying conduction system disease or to a genetic predisposition.  He does have a mildly dilated left atrium. Pacemaker: Will reestablish follow-up with our pacemaker clinic.  Virtually 100% atrial paced, ventricular sensed rhythm.  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 OSA: Reports 100% compliance with CPAP and good benefit from treatment.  He denies any daytime hypersomnolence. Curious about an Inspire device.  Seeing Dr. Thora LanceMeier in the pulmonary clinic.  They have been discussing a possible Inspire device. HLP: On statin.  Target LDL less than 70 based on coronary CT findings.  Despite taking the higher dose of statin than he did 3 years ago he is no longer at target LDL, likely due to weight gain. Hypothyroidism: TSH is now back in normal range B12 deficiency: Due to start supplements, but he is not anemic and does not have microcytosis.   Medication Adjustments/Labs and Tests Ordered: Current medicines are reviewed at length with the patient today.  Concerns regarding medicines are outlined above.  Orders Placed This Encounter  Procedures   Lipid panel   TSH   No orders of the defined types were placed in this encounter.   Patient Instructions  Medication Instructions:  No changes *If you need a refill on your cardiac medications before your next appointment, please call your pharmacy*   Lab Work: Lipid panel, TSH If you have labs (blood work) drawn today and your tests are completely normal, you will receive your results only by: MyChart Message (if you have MyChart) OR A paper copy in the mail If you have any lab test that is abnormal or we need to change your treatment, we  will call you to review the results.  Follow-Up: At [facility], you and your health needs are our priority.  As part of our continuing mission to provide you with exceptional heart care, we have created designated Provider Care Teams.  These Care Teams include your primary Cardiologist (physician) and Advanced Practice Providers (APPs -  Physician Assistants and Nurse Practitioners) who all work together to provide you with the care you need, when you need it.  We recommend signing up for the patient portal called "MyChart".  Sign up information is provided on this After Visit Summary.  MyChart is used to connect with patients for Virtual Visits (Telemedicine).  Patients are able to view lab/test results, encounter notes, upcoming appointments, etc.  Non-urgent messages can be sent to your provider as well.   To learn more about what you can do with MyChart, go to [email or web address].    Your next appointment:    01/03/23  at 1100 with Dr Royann Shivers     Signed, Thurmon Fair, MD  09/29/2022 9:27 AM    [facility]

## 2022-09-27 NOTE — Patient Instructions (Signed)
Medication Instructions:  No changes *If you need a refill on your cardiac medications before your next appointment, please call your pharmacy*   Lab Work: Lipid panel, TSH If you have labs (blood work) drawn today and your tests are completely normal, you will receive your results only by: MyChart Message (if you have MyChart) OR A paper copy in the mail If you have any lab test that is abnormal or we need to change your treatment, we will call you to review the results.  Follow-Up: At [facility], you and your health needs are our priority.  As part of our continuing mission to provide you with exceptional heart care, we have created designated Provider Care Teams.  These Care Teams include your primary Cardiologist (physician) and Advanced Practice Providers (APPs -  Physician Assistants and Nurse Practitioners) who all work together to provide you with the care you need, when you need it.  We recommend signing up for the patient portal called "MyChart".  Sign up information is provided on this After Visit Summary.  MyChart is used to connect with patients for Virtual Visits (Telemedicine).  Patients are able to view lab/test results, encounter notes, upcoming appointments, etc.  Non-urgent messages can be sent to your provider as well.   To learn more about what you can do with MyChart, go to [email or web address].    Your next appointment:    01/03/23 at 1100 with Dr Royann Shivers

## 2022-09-28 ENCOUNTER — Encounter: Payer: Self-pay | Admitting: Cardiovascular Disease

## 2022-09-28 LAB — LIPID PANEL
Chol/HDL Ratio: 2.9 ratio (ref 0.0–5.0)
Cholesterol, Total: 167 mg/dL (ref 100–199)
HDL: 58 mg/dL (ref >39)
LDL Chol Calc (NIH): 85 mg/dL (ref 0–99)
Triglycerides: 138 mg/dL (ref 0–149)
VLDL Cholesterol Cal: 24 mg/dL (ref 5–40)

## 2022-09-28 LAB — TSH: TSH: 1.05 u[IU]/mL (ref 0.450–4.500)

## 2022-09-29 ENCOUNTER — Encounter: Payer: Self-pay | Admitting: Cardiovascular Disease

## 2022-09-30 ENCOUNTER — Encounter: Payer: Self-pay | Admitting: Cardiovascular Disease

## 2022-10-03 DIAGNOSIS — F900 Attention-deficit hyperactivity disorder, predominantly inattentive type: Secondary | ICD-10-CM | POA: Diagnosis not present

## 2022-10-03 DIAGNOSIS — F330 Major depressive disorder, recurrent, mild: Secondary | ICD-10-CM | POA: Diagnosis not present

## 2022-10-03 DIAGNOSIS — F411 Generalized anxiety disorder: Secondary | ICD-10-CM | POA: Diagnosis not present

## 2022-10-09 ENCOUNTER — Ambulatory Visit (INDEPENDENT_AMBULATORY_CARE_PROVIDER_SITE_OTHER): Payer: Medicare HMO

## 2022-10-09 DIAGNOSIS — I495 Sick sinus syndrome: Secondary | ICD-10-CM | POA: Diagnosis not present

## 2022-10-10 DIAGNOSIS — Z6831 Body mass index (BMI) 31.0-31.9, adult: Secondary | ICD-10-CM | POA: Diagnosis not present

## 2022-10-10 DIAGNOSIS — G4733 Obstructive sleep apnea (adult) (pediatric): Secondary | ICD-10-CM | POA: Diagnosis not present

## 2022-10-10 LAB — CUP PACEART REMOTE DEVICE CHECK
Battery Voltage: 55
Date Time Interrogation Session: 20240423081835
Implantable Lead Connection Status: 753985
Implantable Lead Connection Status: 753985
Implantable Lead Implant Date: 20171217
Implantable Lead Implant Date: 20171217
Implantable Lead Location: 753859
Implantable Lead Location: 753860
Implantable Lead Model: 377
Implantable Lead Model: 377
Implantable Lead Serial Number: 49336100
Implantable Lead Serial Number: 49685190
Implantable Pulse Generator Implant Date: 20171217
Pulse Gen Model: 394929
Pulse Gen Serial Number: 68775417

## 2022-10-11 ENCOUNTER — Ambulatory Visit: Payer: Medicare HMO | Admitting: Psychology

## 2022-10-23 DIAGNOSIS — E538 Deficiency of other specified B group vitamins: Secondary | ICD-10-CM | POA: Diagnosis not present

## 2022-10-23 DIAGNOSIS — E039 Hypothyroidism, unspecified: Secondary | ICD-10-CM | POA: Diagnosis not present

## 2022-10-23 DIAGNOSIS — Z950 Presence of cardiac pacemaker: Secondary | ICD-10-CM | POA: Diagnosis not present

## 2022-10-23 DIAGNOSIS — I25118 Atherosclerotic heart disease of native coronary artery with other forms of angina pectoris: Secondary | ICD-10-CM | POA: Diagnosis not present

## 2022-10-23 DIAGNOSIS — Z1389 Encounter for screening for other disorder: Secondary | ICD-10-CM | POA: Diagnosis not present

## 2022-10-23 DIAGNOSIS — I480 Paroxysmal atrial fibrillation: Secondary | ICD-10-CM | POA: Diagnosis not present

## 2022-10-23 DIAGNOSIS — N401 Enlarged prostate with lower urinary tract symptoms: Secondary | ICD-10-CM | POA: Diagnosis not present

## 2022-10-23 DIAGNOSIS — F331 Major depressive disorder, recurrent, moderate: Secondary | ICD-10-CM | POA: Diagnosis not present

## 2022-10-23 DIAGNOSIS — F902 Attention-deficit hyperactivity disorder, combined type: Secondary | ICD-10-CM | POA: Diagnosis not present

## 2022-10-31 ENCOUNTER — Telehealth: Payer: Self-pay | Admitting: Physician Assistant

## 2022-10-31 NOTE — Telephone Encounter (Signed)
Pt called in and left a message with the access nurse. He stated he called his insurance and they do not have an approval for the MRI.

## 2022-10-31 NOTE — Telephone Encounter (Signed)
Got approval [id number] sent to [place] valid 10/31/2022-11/30/2022

## 2022-10-31 NOTE — Telephone Encounter (Signed)
Patient advised, to call MRI at [facility]

## 2022-10-31 NOTE — Telephone Encounter (Signed)
Pt called in and left a message. He stated he would like to find out if his MRI was approved? He has not heard from it.

## 2022-10-31 NOTE — Telephone Encounter (Signed)
Checking with [facility] now  

## 2022-10-31 NOTE — Telephone Encounter (Signed)
Patient needs to call [facility] [phone]

## 2022-11-02 ENCOUNTER — Other Ambulatory Visit: Payer: Self-pay | Admitting: Otolaryngology

## 2022-11-05 ENCOUNTER — Ambulatory Visit: Payer: Medicare HMO | Admitting: Physician Assistant

## 2022-11-06 ENCOUNTER — Ambulatory Visit (INDEPENDENT_AMBULATORY_CARE_PROVIDER_SITE_OTHER): Payer: Medicare HMO | Admitting: Psychology

## 2022-11-06 DIAGNOSIS — F411 Generalized anxiety disorder: Secondary | ICD-10-CM

## 2022-11-06 NOTE — Progress Notes (Signed)
PROGRESS NOTES:  Name: Sony Hinch Date: 11/06/2022 MRN: [id number] DOB: 12/13/1970 PCP: Joya Martyr, MD  Time Spent: Start: 10:00 am End:  10:55 am   Today I met with  [name] in remote video ([facility]) face-to-face individual psychotherapy.  Distance Site: Client's Home Orginating Site: Dr Odette Horns Remote Office Consent: Obtained verbal consent to transmit session remotely        Individualized Treatment Plan               Strengths: motivated, engaged, curious  Supports: family, neighbors and church members   Goal/Needs for Treatment:  In order of importance to patient 1) Learn and implement relapse prevention skills that lead to the alleviation and help prevent the relapse of depression. 2) Learn and implement coping skills that result in a reduction of anxiety and worry, and improved daily functioning. 3)  Engage in social, productive, and recreational activities that are possible in spite of medical condition. 4) Comply with the medication regimen and necessary medical procedures, reporting any side effects or problems to physicians or therapists.  5) Learn and implement coping skills to reduce the disruptive influence of distractibility  and forgetfulness to improved daily functioning.        Client Statement of Needs: states he needs assistance with his depression, anxiety and ADHD.     Treatment Level: Bi-weekly Individual Outpatient Psychotherapy  Symptoms: A diagnosis of a chronic illness that is not life-threatening, but necessitates changes in living.  Autonomic hyperactivity (e.g., palpitations, shortness of breath, dry mouth, trouble swallowing, nausea, diarrhea).   Childhood history of Attention Deficit Disorder (ADD) that was either diagnosed or later concluded due to the symptoms of behavioral problems at school, impulsivity, temper outbursts, and lack of concentration.  Depressed or irritable mood.   Diminished interest in  or enjoyment of activities.  Disorganized in most areas of his/her life.  Easily distracted and drawn from task at hand.  Excessive and/or unrealistic worry that is difficult to control occurring more days than not for at least 6 months about a number of events or activities. Feelings of hopelessness, worthlessness, or inappropriate guilt. Lack of energy.  Low self-esteem.  Psychological or behavioral factors that influence the course of the medical condition.  Psychomotor agitation or retardation.   Sleeplessness or hypersomnia.   Social withdrawal.   Unable to concentrate or pay attention to things of low interest, even when those things are important to his/her life.   Client Treatment Preferences: Continue with present therapist   Healthcare consumer's goal for treatment:  Psychologist, Hilma Favors, PhD will support the patient's ability to achieve the goals identified. Cognitive Behavioral Therapy, Dialectical Behavioral Therapy, Motivational Interviewing, organizational and time management skills, and other evidenced-based practices will be used to promote progress towards healthy functioning.   Healthcare consumer Macky Massoth will: Actively participate in therapy, working towards healthy functioning.    *Justification for Continuation/Discontinuation of Goal: R=Revised, O=Ongoing, A=Achieved, D=Discontinued  Goal 1) Learn and implement relapse prevention skills that lead to the alleviation and help prevent the relapse of depression. . 5 Point Likert rating baseline date: 10/20/2021  Target Date Goal Was reviewed Status Code Progress towards goal/Likert rating  11/08/2022 11/07/2021          O 3/5 - pt has introduced new lifestyle known to reduce depression             Goal 2) Learn and implement coping skills that result in a reduction of anxiety and worry, and improved  daily functioning.     5 Point Likert rating baseline date: 10/20/2021  Target Date Goal Was  reviewed Status Code Progress towards goal/Likert rating  11/08/2022 11/07/2021           O 2/5 - pt implements coping skills inconsistently             Goal 3) Engage in social, productive, and recreational activities that are possible in spite of medical condition. 5 Point Likert rating baseline date: 10/20/2021  Target Date Goal Was reviewed Status Code Progress towards goal/Likert rating  11/08/2022 11/07/2021          O 2/5 - pt is making connections with neighbors and periodically attend church/church events             Goal 4) Comply with the medication regimen and necessary medical procedures, reporting any side effects or problems to  physicians or therapists.  5 Point Likert rating baseline date: 10/20/2021   Target Date Goal Was reviewed Status Code Progress towards goal/Likert rating  11/08/2022 11/07/2021           O 5/5 - pt is compliant in taking medications as prescribed              Goal  5 Point Likert rating baseline date: 10/20/2021  Target Date Goal Was reviewed Status Code Progress towards goal/Likert rating  11/08/2022 11/07/2021           O 2/5 - pt inconsistently implements skills known to reduce distractibility and forgetfulness               This plan has been reviewed and created by the following participants:  This plan will be reviewed at least every 12 months. Date Behavioral Health Clinician Date Guardian/Patient   11/07/2021 Hilma Favors, PhD  11/07/2021 [name]                   Diagnosis: Attention-deficit/hyperactivity disorder, Predominately inattentive presentation Generalized Anxiety Disorder  Major Depressive Disorder, recurrent, mild   Depression Rating: 2 Anxiety Rating: 3-5   [name] reports that he was approved for surgery to implant a device to manage his sleep apnea.  Apparently, he gets anxious in his sleep and he can't keep his cpap mask on.  He is also getting a MRI on his brain to evaluate if his memory issues are related  to arterial blockages, etc.    [name] states that his mother is now staying with him until an apartment opens up.  They selected a retirement community but it may be several months before an apartment is available.  We d/ how it's been having her near and how they are working out any challenges.  I made several suggestions for low or no cost and accessible activities they could enjoy together.   Hilma Favors, PhD  [place] - psychiatric PA, [facility] D[facility] [place]

## 2022-11-07 NOTE — Progress Notes (Signed)
Remote pacemaker transmission.   

## 2022-11-08 ENCOUNTER — Ambulatory Visit: Payer: Medicare HMO | Admitting: Psychology

## 2022-11-09 ENCOUNTER — Ambulatory Visit (HOSPITAL_COMMUNITY): Admission: RE | Admit: 2022-11-09 | Payer: Medicare HMO | Source: Ambulatory Visit

## 2022-11-09 ENCOUNTER — Telehealth: Payer: Self-pay | Admitting: Physician Assistant

## 2022-11-09 ENCOUNTER — Ambulatory Visit (HOSPITAL_COMMUNITY): Payer: Medicare HMO

## 2022-11-09 NOTE — Telephone Encounter (Signed)
Patient cancelled appt today at [facility], will have to get authorizaiton continued past date. Will  call him in a bit

## 2022-11-09 NOTE — Telephone Encounter (Signed)
New message    Patient cancel appt was told next available would be in August.   The patient is asking for a call back to discuss options of getting MRI test done.

## 2022-11-13 ENCOUNTER — Encounter: Payer: Self-pay | Admitting: Cardiovascular Disease

## 2022-11-13 ENCOUNTER — Encounter (HOSPITAL_BASED_OUTPATIENT_CLINIC_OR_DEPARTMENT_OTHER): Payer: Self-pay | Admitting: Otolaryngology

## 2022-11-13 ENCOUNTER — Telehealth: Payer: Self-pay | Admitting: Cardiovascular Disease

## 2022-11-13 ENCOUNTER — Other Ambulatory Visit: Payer: Self-pay

## 2022-11-13 NOTE — Telephone Encounter (Signed)
   [facility] Pre-operative Risk Assessment    Request for surgical clearance:  What type of surgery is being performed?  Drug Induced Sleep Endoscopy   When is this surgery scheduled?  11/20/22   What type of clearance is required (medical clearance vs. Pharmacy clearance to hold med vs. Both)?  Both   Are there any medications that need to be held prior to surgery and how long? Aspirin, 3-5 days prior  Practice name and name of physician performing surgery?   [facility]  Dr. Jenne Pane  What is your office phone number? [phone]   7.   What is your office fax number? [phone]  8.   Anesthesia type (None, local, MAC, general)? General    Mark Strong 11/13/2022, 8:36 AM

## 2022-11-13 NOTE — Telephone Encounter (Signed)
Periop form was completed and sent over.

## 2022-11-13 NOTE — Telephone Encounter (Signed)
   Patient Name: Mark Strong  DOB: Jul 21, 1970 MRN: [id number]  Primary Cardiologist: None  Chart reviewed as part of pre-operative protocol coverage. Given past medical history and time since last visit, based on ACC/AHA guidelines, [name] is at acceptable risk for the planned procedure without further cardiovascular testing.  Patient is able to complete 4 METS of activity without any difficulty.  The patient was advised that if he develops new symptoms prior to surgery to contact our office to arrange for a follow-up visit, and he verbalized understanding.  Regarding ASA therapy, we recommend continuation of ASA throughout the perioperative period.  However, if the surgeon feels that cessation of ASA is required in the perioperative period, it may be stopped 5-7 days prior to surgery with a plan to resume it as soon as felt to be feasible from a surgical standpoint in the post-operative period.   I will also forward patient's information to the device clinic for FYI.  I will route this recommendation to the requesting party via Epic fax function and remove from pre-op pool.  Please call with questions.  Napoleon Form, Leodis Rains, NP 11/13/2022, 11:11 AM

## 2022-11-13 NOTE — Progress Notes (Signed)
Called and left message for Angie at Dr. Jenne Pane office to get asa hold time with cardiac clearance due to pacemaker for planned surgery on 11/20/2022.

## 2022-11-13 NOTE — Progress Notes (Signed)
PERIOPERATIVE PRESCRIPTION FOR IMPLANTED CARDIAC DEVICE PROGRAMMING   Patient Information:  Patient: Mark Strong  MRN: [id number]  Date of Birth: [birth year]      Planned Procedure:  Drug Induced Sleep Endoscopy   Surgeon:  [facility]  Dr. Jenne Pane  Date of Procedure:  11/20/2022    Device Information:   Clinic EP Physician:   Dr. [name] [name] Device Type:  Pacemaker Manufacturer and Phone #:  Biotronik: [phone] Pacemaker Dependent?:  Unknown Date of Last Device Check:  10/09/2022        Normal Device Function?:  Yes     Electrophysiologist's Recommendations:   Have magnet available. Provide continuous ECG monitoring when magnet is used or reprogramming is to be performed.  Procedure may interfere with device function.  Magnet should be placed over device during procedure.  Per Device Clinic Standing Orders, Lenor Coffin  11/13/2022 8:54 AM

## 2022-11-13 NOTE — Telephone Encounter (Signed)
Please also advise on PPM.

## 2022-11-13 NOTE — Anesthesia Preprocedure Evaluation (Addendum)
Anesthesia Evaluation  Patient identified by MRN, date of birth, ID band Patient awake    Reviewed: Allergy & Precautions, NPO status , Patient's Chart, lab work & pertinent test results  Airway Mallampati: II  TM Distance: >3 FB Neck ROM: Full    Dental no notable dental hx. (+) Teeth Intact, Dental Advisory Given, Caps   Pulmonary sleep apnea and Continuous Positive Airway Pressure Ventilation    Pulmonary exam normal breath sounds clear to auscultation       Cardiovascular Normal cardiovascular exam+ dysrhythmias + pacemaker  Rhythm:Regular Rate:Normal     Neuro/Psych  PSYCHIATRIC DISORDERS Anxiety Depression     Neuromuscular disease    GI/Hepatic negative GI ROS, Neg liver ROS,,,  Endo/Other  Hypothyroidism  Obesity Hyperlipidemia  Renal/GU negative Renal ROS  negative genitourinary   Musculoskeletal  (+) Arthritis , Osteoarthritis,    Abdominal   Peds  Hematology negative hematology ROS (+)   Anesthesia Other Findings BMI 31.0-31.9,adult  Obstructive Sleep Apnea    Reproductive/Obstetrics                             Anesthesia Physical Anesthesia Plan  ASA: 3  Anesthesia Plan: General   Post-op Pain Management: Minimal or no pain anticipated   Induction: Intravenous  PONV Risk Score and Plan: 2 and Treatment may vary due to age or medical condition  Airway Management Planned:   Additional Equipment: None  Intra-op Plan:   Post-operative Plan:   Informed Consent: I have reviewed the patients History and Physical, chart, labs and discussed the procedure including the risks, benefits and alternatives for the proposed anesthesia with the patient or authorized representative who has indicated his/her understanding and acceptance.     Dental advisory given  Plan Discussed with: CRNA and Anesthesiologist  Anesthesia Plan Comments:        Anesthesia Quick  Evaluation

## 2022-11-13 NOTE — Progress Notes (Signed)
Chart reviewed with Dr. Bradley Ferris for sleep study planned 11/20/2022 at [facility], ok to proceed at this time.

## 2022-11-13 NOTE — Telephone Encounter (Signed)
   Patient Name: Mark Strong  DOB: 03-10-1971 MRN: [id number]  Primary Cardiologist: None  Chart reviewed as part of pre-operative protocol coverage.   Device team patient will require management and guidance with PPM for upcoming sleep endoscopy procedure.   Napoleon Form, Leodis Rains, NP 11/13/2022, 9:27 AM

## 2022-11-14 DIAGNOSIS — F330 Major depressive disorder, recurrent, mild: Secondary | ICD-10-CM | POA: Diagnosis not present

## 2022-11-14 DIAGNOSIS — F900 Attention-deficit hyperactivity disorder, predominantly inattentive type: Secondary | ICD-10-CM | POA: Diagnosis not present

## 2022-11-14 DIAGNOSIS — F411 Generalized anxiety disorder: Secondary | ICD-10-CM | POA: Diagnosis not present

## 2022-11-14 NOTE — Telephone Encounter (Signed)
This will have to be redone closer to his appt time, now that time has expired

## 2022-11-19 NOTE — Progress Notes (Signed)
Reviewed pacer recommendation note with Dr Chaney Malling who said since Bovie won't be used no need to use magnet; therefore, pacer rep not called for interrogation.

## 2022-11-20 ENCOUNTER — Encounter (HOSPITAL_BASED_OUTPATIENT_CLINIC_OR_DEPARTMENT_OTHER): Payer: Self-pay | Admitting: Otolaryngology

## 2022-11-20 ENCOUNTER — Ambulatory Visit (HOSPITAL_BASED_OUTPATIENT_CLINIC_OR_DEPARTMENT_OTHER): Payer: Medicare HMO | Admitting: Anesthesiology

## 2022-11-20 ENCOUNTER — Ambulatory Visit (HOSPITAL_BASED_OUTPATIENT_CLINIC_OR_DEPARTMENT_OTHER)
Admission: RE | Admit: 2022-11-20 | Discharge: 2022-11-20 | Disposition: A | Discharge status: Home / Self Care | Payer: Medicare HMO | Attending: Otolaryngology | Admitting: Otolaryngology

## 2022-11-20 ENCOUNTER — Encounter (HOSPITAL_BASED_OUTPATIENT_CLINIC_OR_DEPARTMENT_OTHER)
Admission: RE | Disposition: A | Discharge status: Home / Self Care | Payer: Self-pay | Source: Home / Self Care | Attending: Otolaryngology

## 2022-11-20 DIAGNOSIS — Z9989 Dependence on other enabling machines and devices: Secondary | ICD-10-CM

## 2022-11-20 DIAGNOSIS — F32A Depression, unspecified: Secondary | ICD-10-CM | POA: Diagnosis not present

## 2022-11-20 DIAGNOSIS — E039 Hypothyroidism, unspecified: Secondary | ICD-10-CM

## 2022-11-20 DIAGNOSIS — F419 Anxiety disorder, unspecified: Secondary | ICD-10-CM | POA: Diagnosis not present

## 2022-11-20 DIAGNOSIS — G4733 Obstructive sleep apnea (adult) (pediatric): Secondary | ICD-10-CM | POA: Insufficient documentation

## 2022-11-20 DIAGNOSIS — Z950 Presence of cardiac pacemaker: Secondary | ICD-10-CM | POA: Diagnosis not present

## 2022-11-20 DIAGNOSIS — Z6832 Body mass index (BMI) 32.0-32.9, adult: Secondary | ICD-10-CM | POA: Insufficient documentation

## 2022-11-20 DIAGNOSIS — I499 Cardiac arrhythmia, unspecified: Secondary | ICD-10-CM | POA: Diagnosis not present

## 2022-11-20 DIAGNOSIS — E785 Hyperlipidemia, unspecified: Secondary | ICD-10-CM | POA: Diagnosis not present

## 2022-11-20 DIAGNOSIS — F418 Other specified anxiety disorders: Secondary | ICD-10-CM | POA: Diagnosis not present

## 2022-11-20 DIAGNOSIS — M1990 Unspecified osteoarthritis, unspecified site: Secondary | ICD-10-CM | POA: Diagnosis not present

## 2022-11-20 DIAGNOSIS — Z7722 Contact with and (suspected) exposure to environmental tobacco smoke (acute) (chronic): Secondary | ICD-10-CM | POA: Diagnosis not present

## 2022-11-20 DIAGNOSIS — Z01818 Encounter for other preprocedural examination: Secondary | ICD-10-CM

## 2022-11-20 HISTORY — DX: Cardiac arrhythmia, unspecified: I49.9

## 2022-11-20 HISTORY — PX: DRUG INDUCED ENDOSCOPY: SHX6808

## 2022-11-20 HISTORY — DX: Anxiety disorder, unspecified: F41.9

## 2022-11-20 HISTORY — DX: Depression, unspecified: F32.A

## 2022-11-20 SURGERY — DRUG INDUCED SLEEP ENDOSCOPY
Anesthesia: General | Laterality: Bilateral

## 2022-11-20 MED ORDER — PROPOFOL 500 MG/50ML IV EMUL
INTRAVENOUS | Status: DC | PRN
  Administered 2022-11-20: 100 ug/kg/min via INTRAVENOUS

## 2022-11-20 MED ORDER — LACTATED RINGERS IV SOLN: INTRAVENOUS | Status: DC

## 2022-11-20 MED ORDER — PROPOFOL 10 MG/ML IV BOLUS
INTRAVENOUS | Status: DC | PRN
  Administered 2022-11-20: 20 mg via INTRAVENOUS

## 2022-11-20 MED ORDER — LACTATED RINGERS IV SOLN: INTRAVENOUS | Status: DC | PRN

## 2022-11-20 MED ORDER — LIDOCAINE 2% (20 MG/ML) 5 ML SYRINGE
INTRAMUSCULAR | Status: DC | PRN
  Administered 2022-11-20: 100 mg via INTRAVENOUS

## 2022-11-20 MED ORDER — OXYMETAZOLINE HCL 0.05 % NA SOLN
NASAL | Status: DC | PRN
  Administered 2022-11-20: 1

## 2022-11-20 SURGICAL SUPPLY — 11 items
CANISTER SUCT 1200ML W/VALVE (MISCELLANEOUS) ×1 IMPLANT
GLOVE BIO SURGEON STRL SZ7.5 (GLOVE) ×1 IMPLANT
KIT CLEAN ENDO (MISCELLANEOUS) ×1 IMPLANT
NDL HYPO 27GX1-1/4 (NEEDLE) IMPLANT
NEEDLE HYPO 27GX1-1/4 (NEEDLE) IMPLANT
PATTIES SURGICAL .5 X3 (DISPOSABLE) ×1 IMPLANT
SHEET MEDIUM DRAPE 40X70 STRL (DRAPES) ×1 IMPLANT
SOL ANTI FOG 6CC (MISCELLANEOUS) ×1 IMPLANT
SYR CONTROL 10ML LL (SYRINGE) IMPLANT
TOWEL GREEN STERILE FF (TOWEL DISPOSABLE) ×1 IMPLANT
TUBE CONNECTING 20X1/4 (TUBING) ×1 IMPLANT

## 2022-11-20 NOTE — Transfer of Care (Addendum)
Immediate Anesthesia Transfer of Care Note  Patient: Tadan Janicki  Procedure(s) Performed: DRUG INDUCED SLEEP  ENDOSCOPY (Bilateral)  Patient Location: PACU  Anesthesia Type:General  Level of Consciousness: awake, alert , and oriented  Airway & Oxygen Therapy: Patient Spontanous Breathing and Patient connected to nasal cannula oxygen  Post-op Assessment: Report given to RN and Post -op Vital signs reviewed and stable  Post vital signs: Reviewed and stable  Last Vitals:  Vitals Value Taken Time  BP 120/96 11/20/22 1250  Temp    Pulse 99 11/20/22 1251  Resp 10 11/20/22 1251  SpO2 99 % 11/20/22 1251  Vitals shown include unvalidated device data.  Last Pain:  Vitals:   11/20/22 1132  TempSrc: Oral  PainSc: 0-No pain         Complications: No notable events documented.

## 2022-11-20 NOTE — Brief Op Note (Signed)
11/20/2022  12:47 PM  PATIENT:  Mark Strong  52 y.o. male  PRE-OPERATIVE DIAGNOSIS:  BMI 31.0-31.9,adult Obstructive Sleep Apnea  POST-OPERATIVE DIAGNOSIS:  BMI 31.0-31.9,adultObstructive Sleep Apnea  PROCEDURE:  Procedure(s): DRUG INDUCED SLEEP  ENDOSCOPY (Bilateral)  SURGEON:  Surgeon(s) and Role:    Christia Reading, MD - Primary  PHYSICIAN ASSISTANT:   ASSISTANTS: none   ANESTHESIA:   IV sedation  EBL:  none   BLOOD ADMINISTERED:none  DRAINS: none   LOCAL MEDICATIONS USED:  NONE  SPECIMEN:  No Specimen  DISPOSITION OF SPECIMEN:  N/A  COUNTS:  YES  TOURNIQUET:  * No tourniquets in log *  DICTATION: .Note written in EPIC  PLAN OF CARE: Discharge to home after PACU  PATIENT DISPOSITION:  PACU - hemodynamically stable.   Delay start of Pharmacological VTE agent (>24hrs) due to surgical blood loss or risk of bleeding: no

## 2022-11-20 NOTE — Discharge Instructions (Signed)

## 2022-11-20 NOTE — Op Note (Signed)
Preop diagnosis: Obstructive sleep apnea Postop diagnosis: same Procedure: Drug-induced sleep endoscopy Surgeon: Jenne Pane Anesth: IV sedation Compl: None Findings: There is 100% anterior-posterior collapse at the velum making him a candidate for hypoglossal nerve stimulator placement.  There was not very much collapse at the tongue base. Description:  After discussing risks, benefits, and alternatives, the patient was brought to the operative suite and placed on the operative table in the supine position.  Anesthesia was induced and the patient was given light sedation to simulate natural sleep. When the proper level was reached, an Afrin-soaked pledget was placed in the right nasal passage for a couple of minutes and then removed.  The fiberoptic laryngoscope was then passed to view the pharynx and larynx.  Findings are noted above and the exam was recorded.  After completion, the scope was removed and the patient was returned to anesthesia for wakeup and was moved to the recovery room in stable condition.

## 2022-11-20 NOTE — H&P (Signed)
Ayansh Birks is an 52 y.o. male.   Chief Complaint: Sleep apnea HPI: 52 year old male with sleep apnea who has not been able to tolerate CPAP.  Past Medical History:  Diagnosis Date   Anxiety    Depression    Dysrhythmia    Memory loss    Pacemaker    PVNS (pigmented villonodular synovitis)    Sciatica    Sleep apnea    Thyroid disease    Varicose veins of right leg with edema     Past Surgical History:  Procedure Laterality Date   CARDIAC CATHETERIZATION     left and right    INSERT / REPLACE / REMOVE PACEMAKER     Biotronik, dual-chamber, 05/18/2016 for SSS   Right greater saphenous vein ablation Right     Family History  Problem Relation Age of Onset   Arthritis Mother    Depression Mother    Heart disease Mother    Arthritis Father    COPD Father    Diabetes Father    Hyperlipidemia Father    Hypertension Father    Kidney disease Father    Thyroid disease Father    Cancer Brother    Social History:  reports that he has never smoked. He has been exposed to tobacco smoke. He has never used smokeless tobacco. He reports that he does not currently use alcohol. He reports that he does not currently use drugs.  Allergies:  Allergies  Allergen Reactions   Betadine [Povidone Iodine] Rash    Medications Prior to Admission  Medication Sig Dispense Refill   albuterol (VENTOLIN HFA) 108 (90 Base) MCG/ACT inhaler Inhale 2 puffs into the lungs every 6 (six) hours as needed for wheezing or shortness of breath. 8 g 6   aspirin EC 81 MG tablet Take 81 mg by mouth once.     atomoxetine (STRATTERA) 40 MG capsule Take 40 mg by mouth daily.     atorvastatin (LIPITOR) 40 MG tablet Take 1 tablet (40 mg total) by mouth daily. 90 tablet 3   buPROPion (WELLBUTRIN XL) 300 MG 24 hr tablet Take 300 mg by mouth daily.     cyanocobalamin (VITAMIN B12) 1000 MCG/ML injection Inject 1,000 mcg into the muscle every 30 (thirty) days.     DULoxetine (CYMBALTA) 60 MG capsule Take 60 mg  by mouth 2 (two) times daily.     finasteride (PROSCAR) 5 MG tablet Take 1 tablet by mouth daily.     levothyroxine (SYNTHROID) 50 MCG tablet Take 1 tablet by mouth every morning.     traZODone (DESYREL) 50 MG tablet Take 50 mg by mouth at bedtime as needed.     Vitamin D, Ergocalciferol, (DRISDOL) 1.25 MG (50000 UNIT) CAPS capsule Take 50,000 Units by mouth once a week.     zolpidem (AMBIEN) 5 MG tablet Take 1 tablet (5 mg total) by mouth at bedtime as needed for sleep. 30 tablet 2   BD DISP NEEDLES 25G X 5/8" MISC      Blood Pressure Monitoring (BLOOD PRESSURE CUFF) MISC 1 Device by Does not apply route once for 1 dose. 1 each 0   Docusate Sodium (COLACE PO) Take by mouth.     ibuprofen (ADVIL) 800 MG tablet Take 800 mg by mouth 3 (three) times daily.     sildenafil (VIAGRA) 25 MG tablet Take 25 mg by mouth as needed.      No results found for this or any previous visit (from the past 48  hour(s)). No results found.  Review of Systems  All other systems reviewed and are negative.   Blood pressure (!) 150/99, pulse 65, temperature 97.6 F (36.4 C), temperature source Oral, resp. rate 20, height 6\' 2"  (1.88 m), weight 115.1 kg, SpO2 100 %. Physical Exam Constitutional:      Appearance: Normal appearance. He is normal weight.  HENT:     Head: Normocephalic and atraumatic.     Right Ear: External ear normal.     Left Ear: External ear normal.     Nose: Nose normal.     Mouth/Throat:     Mouth: Mucous membranes are moist.     Pharynx: Oropharynx is clear.  Eyes:     Extraocular Movements: Extraocular movements intact.     Conjunctiva/sclera: Conjunctivae normal.     Pupils: Pupils are equal, round, and reactive to light.  Cardiovascular:     Rate and Rhythm: Normal rate.  Pulmonary:     Effort: Pulmonary effort is normal.  Musculoskeletal:     Cervical back: Normal range of motion.  Skin:    General: Skin is warm and dry.  Neurological:     General: No focal deficit present.      Mental Status: He is alert and oriented to person, place, and time.  Psychiatric:        Mood and Affect: Mood normal.        Behavior: Behavior normal.        Thought Content: Thought content normal.        Judgment: Judgment normal.      Assessment/Plan Obstructive sleep apnea and BMI 32.58  To OR for sleep endoscopy.  Christia Reading, MD 11/20/2022, 11:41 AM

## 2022-11-20 NOTE — Anesthesia Postprocedure Evaluation (Signed)
Anesthesia Post Note  Patient: Dagoberto Mccorkel  Procedure(s) Performed: DRUG INDUCED SLEEP  ENDOSCOPY (Bilateral)     Patient location during evaluation: PACU Anesthesia Type: General Level of consciousness: awake and alert and oriented Pain management: pain level controlled Vital Signs Assessment: post-procedure vital signs reviewed and stable Respiratory status: spontaneous breathing, nonlabored ventilation and respiratory function stable Cardiovascular status: blood pressure returned to baseline and stable Postop Assessment: no apparent nausea or vomiting Anesthetic complications: no   No notable events documented.  Last Vitals:  Vitals:   11/20/22 1253 11/20/22 1300  BP:  130/82  Pulse: 83 72  Resp: 12 17  Temp: (!) 36.3 C   SpO2: 99% 96%    Last Pain:  Vitals:   11/20/22 1253  TempSrc:   PainSc: Asleep                 Ghina Bittinger A.

## 2022-11-21 ENCOUNTER — Encounter (HOSPITAL_BASED_OUTPATIENT_CLINIC_OR_DEPARTMENT_OTHER): Payer: Self-pay | Admitting: Otolaryngology

## 2022-12-04 ENCOUNTER — Ambulatory Visit (INDEPENDENT_AMBULATORY_CARE_PROVIDER_SITE_OTHER): Payer: Medicare HMO | Admitting: Psychology

## 2022-12-04 DIAGNOSIS — F330 Major depressive disorder, recurrent, mild: Secondary | ICD-10-CM

## 2022-12-04 NOTE — Progress Notes (Signed)
PROGRESS NOTES:  Name: Mark Strong Date: 12/04/2022 MRN: [id number] DOB: 1970-07-21 PCP: Joya Martyr, MD  Time Spent: Start: 10:00 am End:  10:55 am   Today I met with  Mark Strong in remote video ([facility]) face-to-face individual psychotherapy.  Distance Site: Client's Home Orginating Site: Dr Odette Horns Remote Office Consent: Obtained verbal consent to transmit session remotely      Individualized Treatment Plan               Strengths: motivated, engaged, curious  Supports: family, neighbors and church members   Goal/Needs for Treatment:  In order of importance to patient 1) Learn and implement relapse prevention skills that lead to the alleviation and help prevent the relapse of depression. 2) Learn and implement coping skills that result in a reduction of anxiety and worry, and improved daily functioning. 3)  Engage in social, productive, and recreational activities that are possible in spite of medical condition. 4) Comply with the medication regimen and necessary medical procedures, reporting any side effects or problems to physicians or therapists.  5) Learn and implement coping skills to reduce the disruptive influence of distractibility  and forgetfulness to improved daily functioning.        Client Statement of Needs: states he needs assistance with his depression, anxiety and ADHD.     Treatment Level: Bi-weekly Individual Outpatient Psychotherapy  Symptoms: A diagnosis of a chronic illness that is not life-threatening, but necessitates changes in living.  Autonomic hyperactivity (e.g., palpitations, shortness of breath, dry mouth, trouble swallowing, nausea, diarrhea).   Childhood history of Attention Deficit Disorder (ADD) that was either diagnosed or later concluded due to the symptoms of behavioral problems at school, impulsivity, temper outbursts, and lack of concentration.  Depressed or irritable mood.   Diminished interest in  or enjoyment of activities.  Disorganized in most areas of his/her life.  Easily distracted and drawn from task at hand.  Excessive and/or unrealistic worry that is difficult to control occurring more days than not for at least 6 months about a number of events or activities. Feelings of hopelessness, worthlessness, or inappropriate guilt. Lack of energy.  Low self-esteem.  Psychological or behavioral factors that influence the course of the medical condition.  Psychomotor agitation or retardation.   Sleeplessness or hypersomnia.   Social withdrawal.   Unable to concentrate or pay attention to things of low interest, even when those things are important to his/her life.   Client Treatment Preferences: Continue with present therapist   Healthcare consumer's goal for treatment:  Psychologist, Hilma Favors, PhD will support the patient's ability to achieve the goals identified. Cognitive Behavioral Therapy, Dialectical Behavioral Therapy, Motivational Interviewing, organizational and time management skills, and other evidenced-based practices will be used to promote progress towards healthy functioning.   Healthcare consumer [name] will: Actively participate in therapy, working towards healthy functioning.    *Justification for Continuation/Discontinuation of Goal: R=Revised, O=Ongoing, A=Achieved, D=Discontinued  Goal 1) Learn and implement relapse prevention skills that lead to the alleviation and help prevent the relapse of depression. . 5 Point Likert rating baseline date: 10/20/2021  Target Date Goal Was reviewed Status Code Progress towards goal/Likert rating  11/08/2022 11/07/2021          O 3/5 - pt has introduced new lifestyle known to reduce depression             Goal 2) Learn and implement coping skills that result in a reduction of anxiety and worry, and improved daily  functioning.     5 Point Likert rating baseline date: 10/20/2021  Target Date Goal Was  reviewed Status Code Progress towards goal/Likert rating  11/08/2022 11/07/2021           O 2/5 - pt implements coping skills inconsistently             Goal 3) Engage in social, productive, and recreational activities that are possible in spite of medical condition. 5 Point Likert rating baseline date: 10/20/2021  Target Date Goal Was reviewed Status Code Progress towards goal/Likert rating  11/08/2022 11/07/2021          O 2/5 - pt is making connections with neighbors and periodically attend church/church events             Goal 4) Comply with the medication regimen and necessary medical procedures, reporting any side effects or problems to  physicians or therapists.  5 Point Likert rating baseline date: 10/20/2021   Target Date Goal Was reviewed Status Code Progress towards goal/Likert rating  11/08/2022 11/07/2021           O 5/5 - pt is compliant in taking medications as prescribed              Goal  5 Point Likert rating baseline date: 10/20/2021  Target Date Goal Was reviewed Status Code Progress towards goal/Likert rating  11/08/2022 11/07/2021           O 2/5 - pt inconsistently implements skills known to reduce distractibility and forgetfulness               This plan has been reviewed and created by the following participants:  This plan will be reviewed at least every 12 months. Date Behavioral Health Clinician Date Guardian/Patient   11/07/2021 Hilma Favors, PhD  11/07/2021 Mark Strong                   Diagnosis: Attention-deficit/hyperactivity disorder, Predominately inattentive presentation Generalized Anxiety Disorder  Major Depressive Disorder, recurrent, mild   Depression Rating: 3 Anxiety Rating: 3-4   Mark Strong reports that he continues to move forward with all the steps leading up to the surgery to implant a device to manage his sleep apnea.  He is also getting a MRI on his brain to evaluate if his memory issues are related to arterial blockages, but it  was pushed back to September due to PO issues.    Mark Strong states that his mother has been staying with him until an apartment opens up in the retirement complex they selected.  They state that she's moving up the wait list quickly.  We d/ the importance of balance between allowing her to do some things for herself, limiting what's too much and the need to feel purposeful.   Hilma Favors, PhD  [facility] - [facility] PA, [facility]

## 2022-12-05 ENCOUNTER — Ambulatory Visit: Payer: Medicare HMO | Admitting: Physician Assistant

## 2022-12-06 ENCOUNTER — Ambulatory Visit: Payer: Medicare HMO | Admitting: Psychology

## 2022-12-19 ENCOUNTER — Encounter: Payer: Self-pay | Admitting: Emergency Medicine

## 2022-12-19 ENCOUNTER — Other Ambulatory Visit: Payer: Self-pay | Admitting: Emergency Medicine

## 2022-12-19 ENCOUNTER — Ambulatory Visit (INDEPENDENT_AMBULATORY_CARE_PROVIDER_SITE_OTHER): Payer: Medicare HMO | Admitting: Emergency Medicine

## 2022-12-19 VITALS — BP 130/86 | HR 60 | Temp 98.3°F | Ht 74.0 in | Wt 254.1 lb

## 2022-12-19 DIAGNOSIS — Z7689 Persons encountering health services in other specified circumstances: Secondary | ICD-10-CM

## 2022-12-19 DIAGNOSIS — E221 Hyperprolactinemia: Secondary | ICD-10-CM

## 2022-12-19 DIAGNOSIS — I495 Sick sinus syndrome: Secondary | ICD-10-CM | POA: Diagnosis not present

## 2022-12-19 DIAGNOSIS — M549 Dorsalgia, unspecified: Secondary | ICD-10-CM

## 2022-12-19 DIAGNOSIS — E039 Hypothyroidism, unspecified: Secondary | ICD-10-CM | POA: Diagnosis not present

## 2022-12-19 DIAGNOSIS — Z950 Presence of cardiac pacemaker: Secondary | ICD-10-CM

## 2022-12-19 DIAGNOSIS — Z1211 Encounter for screening for malignant neoplasm of colon: Secondary | ICD-10-CM

## 2022-12-19 DIAGNOSIS — F32A Depression, unspecified: Secondary | ICD-10-CM | POA: Diagnosis not present

## 2022-12-19 DIAGNOSIS — I484 Atypical atrial flutter: Secondary | ICD-10-CM

## 2022-12-19 DIAGNOSIS — G8929 Other chronic pain: Secondary | ICD-10-CM

## 2022-12-19 DIAGNOSIS — G4733 Obstructive sleep apnea (adult) (pediatric): Secondary | ICD-10-CM | POA: Diagnosis not present

## 2022-12-19 DIAGNOSIS — E7800 Pure hypercholesterolemia, unspecified: Secondary | ICD-10-CM

## 2022-12-19 DIAGNOSIS — F419 Anxiety disorder, unspecified: Secondary | ICD-10-CM

## 2022-12-19 MED ORDER — LEVOTHYROXINE SODIUM 75 MCG PO TABS: 75.0000 ug | ORAL_TABLET | Freq: Every morning | ORAL | 3 refills | Status: DC

## 2022-12-19 MED ORDER — LEVOTHYROXINE SODIUM 50 MCG PO TABS: 50.0000 ug | ORAL_TABLET | Freq: Every morning | ORAL | 3 refills | Status: DC

## 2022-12-19 NOTE — Assessment & Plan Note (Signed)
Sees psychiatrist on a regular basis Medications handled by their office

## 2022-12-19 NOTE — Progress Notes (Signed)
Mark Strong 52 y.o.   Chief Complaint  Patient presents with   New Patient (Initial Visit)    No concerns     HISTORY OF PRESENT ILLNESS: This is a 52 y.o. male first visit to this office here to establish care with me Past multiple chronic medical conditions including the following: 1.  Cardiac arrhythmias status post permanent pacemaker placement 2.  Obstructive sleep apnea 3.  Dyslipidemia 4.  Chronic depression and anxiety.  Sees psychiatrist on a regular basis 5.  Chronic musculoskeletal pain.  Sees orthopedist on a regular basis 6.  Hypothyroidism 7.  Hyperprolactinemia  HPI   Prior to Admission medications   Medication Sig Start Date End Date Taking? Authorizing Provider  albuterol (VENTOLIN HFA) 108 (90 Base) MCG/ACT inhaler Inhale 2 puffs into the lungs every 6 (six) hours as needed for wheezing or shortness of breath. 09/05/22  Yes [name], MD  aspirin EC 81 MG tablet Take 81 mg by mouth once. 05/04/20  Yes [provider]  atomoxetine (STRATTERA) 40 MG capsule Take 40 mg by mouth daily. 09/12/22  Yes [provider]  atorvastatin (LIPITOR) 40 MG tablet Take 1 tablet (40 mg total) by mouth daily. 06/29/22  Yes [name], [name], MD  BD DISP NEEDLES 25G X 5/8" MISC  09/06/22  Yes [provider]  buPROPion (WELLBUTRIN XL) 300 MG 24 hr tablet Take 300 mg by mouth daily. 09/12/22  Yes [provider]  cyanocobalamin (VITAMIN B12) 1000 MCG/ML injection Inject 1,000 mcg into the muscle every 30 (thirty) days. 09/06/22  Yes [provider]  Docusate Sodium (COLACE PO) Take by mouth.   Yes [provider]  DULoxetine (CYMBALTA) 60 MG capsule Take 60 mg by mouth 2 (two) times daily. 11/07/20  Yes [provider]  finasteride (PROSCAR) 5 MG tablet Take 1 tablet by mouth daily. 10/22/20  Yes [provider]  ibuprofen (ADVIL) 800 MG tablet Take 800 mg by mouth 3 (three) times daily. 02/24/20  Yes  [provider]  levothyroxine (SYNTHROID) 50 MCG tablet Take 1 tablet by mouth every morning. 10/22/20  Yes [provider]  sildenafil (VIAGRA) 25 MG tablet Take 25 mg by mouth as needed. 06/08/21  Yes [provider]  traZODone (DESYREL) 50 MG tablet Take 50 mg by mouth at bedtime as needed. 06/20/22  Yes [provider]  Vitamin D, Ergocalciferol, (DRISDOL) 1.25 MG (50000 UNIT) CAPS capsule Take 50,000 Units by mouth once a week.   Yes [provider]  Blood Pressure Monitoring (BLOOD PRESSURE CUFF) MISC 1 Device by Does not apply route once for 1 dose. 10/15/18 09/27/22  [name], [name], MD  zolpidem (AMBIEN) 5 MG tablet Take 1 tablet (5 mg total) by mouth at bedtime as needed for sleep. Patient not taking: Reported on 12/19/2022 09/05/22   [name], MD    Allergies  Allergen Reactions   Betadine [Povidone Iodine] Rash    Patient Active Problem List   Diagnosis Date Noted   Memory difficulties 09/04/2022   Varicose veins of both lower extremities 03/13/2019   Atypical atrial flutter (HCC) 12/18/2018   Hyperprolactinemia (HCC) 10/28/2018   Hypercholesterolemia 10/15/2018   SSS (sick sinus syndrome) (HCC) 08/20/2018   Pacemaker 05/28/2018   OSA (obstructive sleep apnea) 05/28/2018   Chondromalacia of left knee 05/23/2018   Chondromalacia of right patella 05/23/2018   Hypothyroidism 04/28/2018   Right thyroid nodule 04/28/2018   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   Chronic back pain 03/14/2018  Bradycardia 03/14/2018    Past Medical History:  Diagnosis Date   Allergy Dec1,2017   Anxiety    Arthritis 2018   Depression    Dysrhythmia    Heart murmur 2012   Memory loss    Pacemaker    PVNS (pigmented villonodular synovitis)    Sciatica    Sleep apnea    Thyroid disease    Varicose veins of right leg with edema     Past Surgical History:  Procedure Laterality Date   CARDIAC CATHETERIZATION     left and right     DRUG INDUCED ENDOSCOPY Bilateral 11/20/2022   Procedure: DRUG INDUCED SLEEP  ENDOSCOPY;  Surgeon: Christia Reading, MD;  Location: [facility];  Service: ENT;  Laterality: Bilateral;   INSERT / REPLACE / REMOVE PACEMAKER     Biotronik, dual-chamber, 05/18/2016 for SSS   Right greater saphenous vein ablation Right     Social History   Socioeconomic History   Marital status: Single    Spouse name: Not on file   Number of children: 0   Years of education: 12   Highest education level: 12th grade  Occupational History   Not on file  Tobacco Use   Smoking status: Never    Passive exposure: Past   Smokeless tobacco: Never  Vaping Use   Vaping Use: Never used  Substance and Sexual Activity   Alcohol use: Never   Drug use: Never   Sexual activity: Not Currently    Birth control/protection: Condom  Other Topics Concern   Not on file  Social History Narrative   Right handed   Sugar free    Lives alone   Disabled    One floor home   Social Determinants of Health   Financial Resource Strain: Not on file  Food Insecurity: Not on file  Transportation Needs: Not on file  Physical Activity: Not on file  Stress: Not on file  Social Connections: Not on file  Intimate Partner Violence: Not on file    Family History  Problem Relation Age of Onset   Arthritis Mother    Depression Mother    Heart disease Mother    Obesity Mother    Arthritis Father    COPD Father    Diabetes Father    Hyperlipidemia Father    Hypertension Father    Kidney disease Father    Thyroid disease Father    Cancer Brother      Review of Systems  Constitutional: Negative.  Negative for chills and fever.  HENT: Negative.  Negative for congestion and sore throat.   Respiratory: Negative.  Negative for cough and shortness of breath.   Cardiovascular: Negative.  Negative for chest pain and palpitations.  Gastrointestinal:  Negative for abdominal pain, nausea and vomiting.   Musculoskeletal:  Positive for back pain.  Skin: Negative.  Negative for rash.  Neurological: Negative.  Negative for dizziness and headaches.    Vitals:   12/19/22 0905  BP: 130/86  Pulse: 60  Temp: 98.3 F (36.8 C)  SpO2: 99%    Physical Exam Vitals reviewed.  Constitutional:      Appearance: Normal appearance.  HENT:     Head: Normocephalic.  Eyes:     Extraocular Movements: Extraocular movements intact.     Pupils: Pupils are equal, round, and reactive to light.  Cardiovascular:     Rate and Rhythm: Normal rate and regular rhythm.     Pulses: Normal pulses.  Heart sounds: Normal heart sounds.  Pulmonary:     Effort: Pulmonary effort is normal.     Breath sounds: Normal breath sounds.  Musculoskeletal:     Cervical back: No tenderness.  Lymphadenopathy:     Cervical: No cervical adenopathy.  Skin:    General: Skin is warm and dry.  Neurological:     General: No focal deficit present.     Mental Status: He is alert and oriented to person, place, and time.  Psychiatric:        Mood and Affect: Mood normal.        Behavior: Behavior normal.      ASSESSMENT & PLAN: A total of 48 minutes was spent with the patient and counseling/coordination of care regarding preparing for this visit, review of available medical records, establishing care with me, comprehensive history and physical examination, review of multiple chronic medical conditions under management, review of all medications, prognosis, documentation and need for follow-up.  Problem List Items Addressed This Visit       Cardiovascular and Mediastinum   SSS (sick sinus syndrome) (HCC)    Pacemaker in place. Stable.  Follows up with cardiologist on a regular basis Most recent echocardiogram report reviewed.  Normal with preserved ejection fraction.  No significant valvular heart disease      Atypical atrial flutter (HCC) - Primary    Atrial fibrillation Pacemaker in place Not on  beta-blocker Takes 1 daily baby aspirin        Respiratory   OSA (obstructive sleep apnea)    Recently diagnosed with moderate sleep apnea On CPAP treatment        Endocrine   Hypothyroidism    Clinically euthyroid. Presently on Synthroid 50 mcg daily       Relevant Medications   levothyroxine (SYNTHROID) 50 MCG tablet   Hyperprolactinemia (HCC)    Normal most recent levels No history of prolactinoma        Other   Chronic back pain    Sees orthopedist on a regular basis Chronic pain management handled by them      Pacemaker    Recently tested Not pacemaker dependent      Hypercholesterolemia    Chronic stable condition Continues on atorvastatin 40 mg daily      Chronic anxiety    Sees psychiatrist on a regular basis Medications handled by their office      Chronic depression    Sees psychiatrist on a regular basis Medications handled by their office      Other Visit Diagnoses     Encounter to establish care       Screening for colon cancer       Relevant Orders   Ambulatory referral to Gastroenterology         Patient Instructions  Health Maintenance, Male Adopting a healthy lifestyle and getting preventive care are important in promoting health and wellness. Ask your health care provider about: The right schedule for you to have regular tests and exams. Things you can do on your own to prevent diseases and keep yourself healthy. What should I know about diet, weight, and exercise? Eat a healthy diet  Eat a diet that includes plenty of vegetables, fruits, low-fat dairy products, and lean protein. Do not eat a lot of foods that are high in solid fats, added sugars, or sodium. Maintain a healthy weight Body mass index (BMI) is a measurement that can be used to identify possible weight problems. It estimates body fat  based on height and weight. Your health care provider can help determine your BMI and help you achieve or maintain a healthy  weight. Get regular exercise Get regular exercise. This is one of the most important things you can do for your health. Most adults should: Exercise for at least 150 minutes each week. The exercise should increase your heart rate and make you sweat (moderate-intensity exercise). Do strengthening exercises at least twice a week. This is in addition to the moderate-intensity exercise. Spend less time sitting. Even light physical activity can be beneficial. Watch cholesterol and blood lipids Have your blood tested for lipids and cholesterol at 52 years of age, then have this test every 5 years. You may need to have your cholesterol levels checked more often if: Your lipid or cholesterol levels are high. You are older than 52 years of age. You are at high risk for heart disease. What should I know about cancer screening? Many types of cancers can be detected early and may often be prevented. Depending on your health history and family history, you may need to have cancer screening at various ages. This may include screening for: Colorectal cancer. Prostate cancer. Skin cancer. Lung cancer. What should I know about heart disease, diabetes, and high blood pressure? Blood pressure and heart disease High blood pressure causes heart disease and increases the risk of stroke. This is more likely to develop in people who have high blood pressure readings or are overweight. Talk with your health care provider about your target blood pressure readings. Have your blood pressure checked: Every 3-5 years if you are 83-41 years of age. Every year if you are 20 years old or older. If you are between the ages of 67 and 49 and are a current or former smoker, ask your health care provider if you should have a one-time screening for abdominal aortic aneurysm (AAA). Diabetes Have regular diabetes screenings. This checks your fasting blood sugar level. Have the screening done: Once every three years after age 2  if you are at a normal weight and have a low risk for diabetes. More often and at a younger age if you are overweight or have a high risk for diabetes. What should I know about preventing infection? Hepatitis B If you have a higher risk for hepatitis B, you should be screened for this virus. Talk with your health care provider to find out if you are at risk for hepatitis B infection. Hepatitis C Blood testing is recommended for: Everyone born from [birth year] through 1965. Anyone with known risk factors for hepatitis C. Sexually transmitted infections (STIs) You should be screened each year for STIs, including gonorrhea and chlamydia, if: You are sexually active and are younger than 52 years of age. You are older than 52 years of age and your health care provider tells you that you are at risk for this type of infection. Your sexual activity has changed since you were last screened, and you are at increased risk for chlamydia or gonorrhea. Ask your health care provider if you are at risk. Ask your health care provider about whether you are at high risk for HIV. Your health care provider may recommend a prescription medicine to help prevent HIV infection. If you choose to take medicine to prevent HIV, you should first get tested for HIV. You should then be tested every 3 months for as long as you are taking the medicine. Follow these instructions at home: Alcohol use Do not drink alcohol if  your health care provider tells you not to drink. If you drink alcohol: Limit how much you have to 0-2 drinks a day. Know how much alcohol is in your drink. In the [place] one drink equals one 12 oz bottle of beer (355 mL), one 5 oz glass of wine (148 mL), or one 1 oz glass of hard liquor (44 mL). Lifestyle Do not use any products that contain nicotine or tobacco. These products include cigarettes, chewing tobacco, and vaping devices, such as e-cigarettes. If you need help quitting, ask your health care provider. Do  not use street drugs. Do not share needles. Ask your health care provider for help if you need support or information about quitting drugs. General instructions Schedule regular health, dental, and eye exams. Stay current with your vaccines. Tell your health care provider if: You often feel depressed. You have ever been abused or do not feel safe at home. Summary Adopting a healthy lifestyle and getting preventive care are important in promoting health and wellness. Follow your health care provider's instructions about healthy diet, exercising, and getting tested or screened for diseases. Follow your health care provider's instructions on monitoring your cholesterol and blood pressure. This information is not intended to replace advice given to you by your health care provider. Make sure you discuss any questions you have with your health care provider. Document Revised: 10/24/2020 Document Reviewed: 10/24/2020 Elsevier Patient Education  2024 Elsevier Inc.    Edwina Barth, MD [facility]

## 2022-12-19 NOTE — Assessment & Plan Note (Addendum)
Pacemaker in place. Stable.  Follows up with cardiologist on a regular basis Most recent echocardiogram report reviewed.  Normal with preserved ejection fraction.  No significant valvular heart disease

## 2022-12-19 NOTE — Assessment & Plan Note (Signed)
Atrial fibrillation Pacemaker in place Not on beta-blocker Takes 1 daily baby aspirin

## 2022-12-19 NOTE — Assessment & Plan Note (Signed)
Sees psychiatrist on a regular basis Medications handled by their office 

## 2022-12-19 NOTE — Patient Instructions (Signed)
Health Maintenance, Male Adopting a healthy lifestyle and getting preventive care are important in promoting health and wellness. Ask your health care provider about: The right schedule for you to have regular tests and exams. Things you can do on your own to prevent diseases and keep yourself healthy. What should I know about diet, weight, and exercise? Eat a healthy diet  Eat a diet that includes plenty of vegetables, fruits, low-fat dairy products, and lean protein. Do not eat a lot of foods that are high in solid fats, added sugars, or sodium. Maintain a healthy weight Body mass index (BMI) is a measurement that can be used to identify possible weight problems. It estimates body fat based on height and weight. Your health care provider can help determine your BMI and help you achieve or maintain a healthy weight. Get regular exercise Get regular exercise. This is one of the most important things you can do for your health. Most adults should: Exercise for at least 150 minutes each week. The exercise should increase your heart rate and make you sweat (moderate-intensity exercise). Do strengthening exercises at least twice a week. This is in addition to the moderate-intensity exercise. Spend less time sitting. Even light physical activity can be beneficial. Watch cholesterol and blood lipids Have your blood tested for lipids and cholesterol at 52 years of age, then have this test every 5 years. You may need to have your cholesterol levels checked more often if: Your lipid or cholesterol levels are high. You are older than 52 years of age. You are at high risk for heart disease. What should I know about cancer screening? Many types of cancers can be detected early and may often be prevented. Depending on your health history and family history, you may need to have cancer screening at various ages. This may include screening for: Colorectal cancer. Prostate cancer. Skin cancer. Lung  cancer. What should I know about heart disease, diabetes, and high blood pressure? Blood pressure and heart disease High blood pressure causes heart disease and increases the risk of stroke. This is more likely to develop in people who have high blood pressure readings or are overweight. Talk with your health care provider about your target blood pressure readings. Have your blood pressure checked: Every 3-5 years if you are 18-39 years of age. Every year if you are 40 years old or older. If you are between the ages of 65 and 75 and are a current or former smoker, ask your health care provider if you should have a one-time screening for abdominal aortic aneurysm (AAA). Diabetes Have regular diabetes screenings. This checks your fasting blood sugar level. Have the screening done: Once every three years after age 45 if you are at a normal weight and have a low risk for diabetes. More often and at a younger age if you are overweight or have a high risk for diabetes. What should I know about preventing infection? Hepatitis B If you have a higher risk for hepatitis B, you should be screened for this virus. Talk with your health care provider to find out if you are at risk for hepatitis B infection. Hepatitis C Blood testing is recommended for: Everyone born from 1945 through 1965. Anyone with known risk factors for hepatitis C. Sexually transmitted infections (STIs) You should be screened each year for STIs, including gonorrhea and chlamydia, if: You are sexually active and are younger than 52 years of age. You are older than 52 years of age and your   health care provider tells you that you are at risk for this type of infection. Your sexual activity has changed since you were last screened, and you are at increased risk for chlamydia or gonorrhea. Ask your health care provider if you are at risk. Ask your health care provider about whether you are at high risk for HIV. Your health care provider  may recommend a prescription medicine to help prevent HIV infection. If you choose to take medicine to prevent HIV, you should first get tested for HIV. You should then be tested every 3 months for as long as you are taking the medicine. Follow these instructions at home: Alcohol use Do not drink alcohol if your health care provider tells you not to drink. If you drink alcohol: Limit how much you have to 0-2 drinks a day. Know how much alcohol is in your drink. In the [place] one drink equals one 12 oz bottle of beer (355 mL), one 5 oz glass of wine (148 mL), or one 1 oz glass of hard liquor (44 mL). Lifestyle Do not use any products that contain nicotine or tobacco. These products include cigarettes, chewing tobacco, and vaping devices, such as e-cigarettes. If you need help quitting, ask your health care provider. Do not use street drugs. Do not share needles. Ask your health care provider for help if you need support or information about quitting drugs. General instructions Schedule regular health, dental, and eye exams. Stay current with your vaccines. Tell your health care provider if: You often feel depressed. You have ever been abused or do not feel safe at home. Summary Adopting a healthy lifestyle and getting preventive care are important in promoting health and wellness. Follow your health care provider's instructions about healthy diet, exercising, and getting tested or screened for diseases. Follow your health care provider's instructions on monitoring your cholesterol and blood pressure. This information is not intended to replace advice given to you by your health care provider. Make sure you discuss any questions you have with your health care provider. Document Revised: 10/24/2020 Document Reviewed: 10/24/2020 Elsevier Patient Education  2024 Elsevier Inc.  

## 2022-12-19 NOTE — Telephone Encounter (Signed)
New prescription sent to pharmacy of record today.  Thanks.

## 2022-12-19 NOTE — Assessment & Plan Note (Signed)
Normal most recent levels No history of prolactinoma

## 2022-12-19 NOTE — Assessment & Plan Note (Signed)
Chronic stable condition Continues on atorvastatin 40 mg daily

## 2022-12-19 NOTE — Assessment & Plan Note (Signed)
Sees orthopedist on a regular basis Chronic pain management handled by them

## 2022-12-19 NOTE — Assessment & Plan Note (Signed)
Clinically euthyroid. Presently on Synthroid 50 mcg daily

## 2022-12-19 NOTE — Assessment & Plan Note (Signed)
Recently diagnosed with moderate sleep apnea On CPAP treatment

## 2022-12-19 NOTE — Assessment & Plan Note (Signed)
Recently tested Not pacemaker dependent

## 2023-01-01 ENCOUNTER — Other Ambulatory Visit: Payer: Self-pay | Admitting: Otolaryngology

## 2023-01-03 ENCOUNTER — Other Ambulatory Visit: Payer: Self-pay

## 2023-01-03 ENCOUNTER — Encounter: Payer: Medicare HMO | Admitting: Cardiovascular Disease

## 2023-01-03 ENCOUNTER — Other Ambulatory Visit: Payer: Self-pay | Admitting: Emergency Medicine

## 2023-01-03 DIAGNOSIS — Z79899 Other long term (current) drug therapy: Secondary | ICD-10-CM | POA: Diagnosis not present

## 2023-01-03 DIAGNOSIS — E1169 Type 2 diabetes mellitus with other specified complication: Secondary | ICD-10-CM | POA: Diagnosis not present

## 2023-01-03 DIAGNOSIS — E7800 Pure hypercholesterolemia, unspecified: Secondary | ICD-10-CM

## 2023-01-04 LAB — COMPREHENSIVE METABOLIC PANEL
ALT: 24 IU/L (ref 0–44)
AST: 22 IU/L (ref 0–40)
Albumin: 4.8 g/dL (ref 3.8–4.9)
Alkaline Phosphatase: 73 IU/L (ref 44–121)
BUN/Creatinine Ratio: 12 (ref 9–20)
BUN: 14 mg/dL (ref 6–24)
Bilirubin Total: 0.7 mg/dL (ref 0.0–1.2)
CO2: 26 mmol/L (ref 20–29)
Calcium: 10 mg/dL (ref 8.7–10.2)
Chloride: 103 mmol/L (ref 96–106)
Creatinine, Ser: 1.2 mg/dL (ref 0.76–1.27)
Globulin, Total: 2.6 g/dL (ref 1.5–4.5)
Glucose: 86 mg/dL (ref 70–99)
Potassium: 5.2 mmol/L (ref 3.5–5.2)
Sodium: 144 mmol/L (ref 134–144)
Total Protein: 7.4 g/dL (ref 6.0–8.5)
eGFR: 73 mL/min/{1.73_m2} (ref >59)

## 2023-01-04 LAB — LIPID PANEL
Chol/HDL Ratio: 3 ratio (ref 0.0–5.0)
Cholesterol, Total: 180 mg/dL (ref 100–199)
HDL: 60 mg/dL (ref >39)
LDL Chol Calc (NIH): 100 mg/dL — ABNORMAL HIGH (ref 0–99)
Triglycerides: 110 mg/dL (ref 0–149)
VLDL Cholesterol Cal: 20 mg/dL (ref 5–40)

## 2023-01-04 LAB — HEMOGLOBIN A1C
Est. average glucose Bld gHb Est-mCnc: 103 mg/dL
Hgb A1c MFr Bld: 5.2 % (ref 4.8–5.6)

## 2023-01-08 ENCOUNTER — Ambulatory Visit: Payer: Medicare HMO | Admitting: Emergency Medicine

## 2023-01-08 ENCOUNTER — Ambulatory Visit (INDEPENDENT_AMBULATORY_CARE_PROVIDER_SITE_OTHER): Payer: Medicare HMO

## 2023-01-08 DIAGNOSIS — I495 Sick sinus syndrome: Secondary | ICD-10-CM | POA: Diagnosis not present

## 2023-01-10 ENCOUNTER — Encounter: Payer: Self-pay | Admitting: Cardiovascular Disease

## 2023-01-10 ENCOUNTER — Ambulatory Visit: Payer: Medicare HMO | Attending: Cardiovascular Disease | Admitting: Cardiovascular Disease

## 2023-01-10 VITALS — BP 118/82 | HR 60 | Ht 74.0 in | Wt 257.8 lb

## 2023-01-10 DIAGNOSIS — F330 Major depressive disorder, recurrent, mild: Secondary | ICD-10-CM | POA: Diagnosis not present

## 2023-01-10 DIAGNOSIS — E039 Hypothyroidism, unspecified: Secondary | ICD-10-CM

## 2023-01-10 DIAGNOSIS — I480 Paroxysmal atrial fibrillation: Secondary | ICD-10-CM

## 2023-01-10 DIAGNOSIS — E7800 Pure hypercholesterolemia, unspecified: Secondary | ICD-10-CM

## 2023-01-10 DIAGNOSIS — Z01810 Encounter for preprocedural cardiovascular examination: Secondary | ICD-10-CM | POA: Diagnosis not present

## 2023-01-10 DIAGNOSIS — Z950 Presence of cardiac pacemaker: Secondary | ICD-10-CM | POA: Diagnosis not present

## 2023-01-10 DIAGNOSIS — G4733 Obstructive sleep apnea (adult) (pediatric): Secondary | ICD-10-CM | POA: Diagnosis not present

## 2023-01-10 DIAGNOSIS — F411 Generalized anxiety disorder: Secondary | ICD-10-CM | POA: Diagnosis not present

## 2023-01-10 DIAGNOSIS — F900 Attention-deficit hyperactivity disorder, predominantly inattentive type: Secondary | ICD-10-CM | POA: Diagnosis not present

## 2023-01-10 DIAGNOSIS — I495 Sick sinus syndrome: Secondary | ICD-10-CM | POA: Diagnosis not present

## 2023-01-10 LAB — CUP PACEART REMOTE DEVICE CHECK
Date Time Interrogation Session: 20240723092449
Implantable Lead Connection Status: 753985
Implantable Lead Connection Status: 753985
Implantable Lead Implant Date: 20171217
Implantable Lead Location: 753859
Implantable Lead Location: 753860
Implantable Lead Model: 377
Implantable Lead Model: 377
Implantable Lead Serial Number: 49336100
Implantable Lead Serial Number: 49685190
Implantable Pulse Generator Implant Date: 20171217
Pulse Gen Model: 394929
Pulse Gen Serial Number: 68775417

## 2023-01-10 MED ORDER — EZETIMIBE 10 MG PO TABS: 10.0000 mg | ORAL_TABLET | Freq: Every day | ORAL | 3 refills | Status: DC

## 2023-01-10 NOTE — Patient Instructions (Signed)

## 2023-01-10 NOTE — Progress Notes (Signed)
Cardiology Office Note:    Date:  01/10/2023   ID:  Mark Strong, DOB [birth year], MRN [id number]  PCP:  Joya Martyr, MD  Cardiologist:  None  Electrophysiologist:  None   Referring MD: Joya Martyr, MD   No chief complaint on file.   History of Present Illness:    Mark Strong is a 52 y.o. 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  He received a dual-chamber permanent pacemaker in 2017 in [place].  He has mild nonobstructive CAD by coronary CT angiography performed in [place] in Nov 2021.  He has obstructive sleep apnea uses CPAP, but wants to get an inspire device.  He has treated hypothyroidism.Marland Kitchen  His last appointments were made a couple of changes to his rate response sensor settings and he feels that he has more energy.  Biggest limiting factor is back pain.  He does not have orthopnea, PND, lower extremity edema.  Continues to use CPAP but is scheduled on September 3 for implantation of a inspire device with Dr. Jenne Pane.  His dual-chamber Biotronik E. Luna 8 pacemaker was implanted in 2017.  The device is functioning normally in DDDR-CLS mode.  Lead parameters are excellent and estimated generator longevity is about 5 years.  Has 91% atrial pacing and only 5% ventricular pacing he has very frequent episodes of paroxysmal atrial tachycardia lasting for only 15 beats on the current download.  He has not had any more atrial fibrillation and there is no evidence of other episodes of high ventricular rate he had a single episode of paroxysmal atrial fibrillation lasting for 36 seconds in March 2023, without recurrence since then.  Previously his device had reported a 5-minute episode of atrial fibrillation in May 2021.  The heart rate histogram distribution appears appropriate.   An echocardiogram performed 07/24/2022 shows normal findings other than a mildly dilated left atrium.  The official  report states that he has "stage I diastolic dysfunction" but his mitral annulus diastolic velocities are pretty normal in the E/e' ratio is quite low.  On the other hand, his official report states that his left atrium is normal in size when in fact it is mildly dilated.  He moved to [facility] for couple of years to take care of his ailing father, who unfortunately has passed away.  While he was in [place] he had an episode of chest pain and was referred for coronary CT angiography.  This showed widespread atherosclerotic plaque with 25% stenoses in all the major coronary territories and a 25-49% stenosis in the mid LAD artery.  The formal calcium score was not calculated.  Mark Strong does not remember complaining of chest pain, but did have some shortness of breath.  He did not have an echocardiogram performed at that time.  On atorvastatin.  Uses Cialis for ED.  Compliant with CPAP for sleep apnea.  Has chronic pain and receives chronic buprenorphine.  Takes appropriate hormone supplements for hypothyroidism and allopurinol for hyperuricemia.  Prior to pacemaker implantation he had normal coronary angiography in 2016 and EF 55%.  Minor CAD by coronary CT angiography in [place] in Nov 2021.  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  He has a history of endovenous laser ablation of the right greater saphenous vein.    Past Medical History:  Diagnosis Date   Allergy NWG9,5621   Anxiety  Arthritis 2018   Depression    Dysrhythmia    Heart murmur 2012   Memory loss    Pacemaker    PVNS (pigmented villonodular synovitis)    Sciatica    Sleep apnea    Thyroid disease    Varicose veins of right leg with edema     Past Surgical History:  Procedure Laterality Date   CARDIAC CATHETERIZATION     left and right    DRUG INDUCED ENDOSCOPY Bilateral 11/20/2022   Procedure: DRUG INDUCED SLEEP   ENDOSCOPY;  Surgeon: Christia Reading, MD;  Location: [facility];  Service: ENT;  Laterality: Bilateral;   INSERT / REPLACE / REMOVE PACEMAKER     Biotronik, dual-chamber, 05/18/2016 for SSS   Right greater saphenous vein ablation Right     Current Medications: Current Meds  Medication Sig   aspirin EC 81 MG tablet Take 81 mg by mouth once.   atomoxetine (STRATTERA) 40 MG capsule Take 40 mg by mouth daily.   atorvastatin (LIPITOR) 40 MG tablet Take 1 tablet (40 mg total) by mouth daily.   BD DISP NEEDLES 25G X 5/8" MISC    buPROPion (WELLBUTRIN XL) 300 MG 24 hr tablet Take 300 mg by mouth daily.   cyanocobalamin (VITAMIN B12) 1000 MCG/ML injection Inject 1,000 mcg into the muscle every 30 (thirty) days.   Docusate Sodium (COLACE PO) Take by mouth.   DULoxetine (CYMBALTA) 60 MG capsule Take 60 mg by mouth 2 (two) times daily.   finasteride (PROSCAR) 5 MG tablet Take 1 tablet by mouth daily.   ibuprofen (ADVIL) 800 MG tablet Take 800 mg by mouth 3 (three) times daily.   levothyroxine (SYNTHROID) 75 MCG tablet Take 1 tablet (75 mcg total) by mouth every morning.   sildenafil (VIAGRA) 25 MG tablet Take 25 mg by mouth as needed.   traZODone (DESYREL) 50 MG tablet Take 50 mg by mouth at bedtime as needed.   Vitamin D, Ergocalciferol, (DRISDOL) 1.25 MG (50000 UNIT) CAPS capsule Take 50,000 Units by mouth once a week.     Allergies:   Betadine [povidone iodine]   Social History   Socioeconomic History   Marital status: Single    Spouse name: Not on file   Number of children: 0   Years of education: 12   Highest education level: 12th grade  Occupational History   Not on file  Tobacco Use   Smoking status: Never    Passive exposure: Past   Smokeless tobacco: Never  Vaping Use   Vaping status: Never Used  Substance and Sexual Activity   Alcohol use: Never   Drug use: Never   Sexual activity: Not Currently    Birth control/protection: Condom  Other Topics Concern   Not  on file  Social History Narrative   Right handed   Sugar free    Lives alone   Disabled    One floor home   Social Determinants of Health   Financial Resource Strain: Not on file  Food Insecurity: Food Insecurity Present (01/29/2022)   [facility]   Hunger Vital Sign    Worried About Running Out of Food in the Last Year: Sometimes true    Ran Out of Food in the Last Year: Sometimes true  Transportation Needs: No Transportation Needs (01/29/2022)   [facility]   PRAPARE - Transportation    Lack of Transportation (Medical): No    Lack of Transportation (Non-Medical): No  Physical Activity: Not on file  Stress: Not on file  Social Connections: Not on file     Family History: The patient's family history includes Arthritis in his father and mother; COPD in his father; Cancer in his brother; Depression in his mother; Diabetes in his father; Heart disease in his mother; Hyperlipidemia in his father; Hypertension in his father; Kidney disease in his father; Obesity in his mother; Thyroid disease in his father.  ROS:   Please see the history of present illness.     All other systems reviewed and are negative.  EKGs/Labs/Other Studies Reviewed:    The following studies were reviewed today: Comprehensive pacemaker check performed in the office today.  No additional changes were made to pacemaker settings.  PFTs on 09/12/2022:      Component Ref Range & Units 2 wk ago  FVC-Pre L 5.60  FVC-%Pred-Pre % 97  FVC-Post L 5.91  FVC-%Pred-Post % 103  FVC-%Change-Post % 5  FEV1-Pre L 4.60  FEV1-%Pred-Pre % 103  FEV1-Post L 4.87  FEV1-%Pred-Post % 110  FEV1-%Change-Post % 5  FEV6-Pre L 5.55  FEV6-%Pred-Pre % 100  FEV6-Post L 5.90  FEV6-%Pred-Post % 106  FEV6-%Change-Post % 6  Pre FEV1/FVC ratio % 82  FEV1FVC-%Pred-Pre % 105  Post FEV1/FVC ratio % 82  FEV1FVC-%Change-Post % 0  Pre FEV6/FVC Ratio %  100  FEV6FVC-%Pred-Pre % 103  Post FEV6/FVC ratio % 100  FEV6FVC-%Pred-Post % 103  FEF 25-75 Pre L/sec 5.13  FEF2575-%Pred-Pre % 134  FEF 25-75 Post L/sec 6.19  FEF2575-%Pred-Post % 162  FEF2575-%Change-Post % 20  RV L 2.20  RV % pred % 96  TLC L 7.92  TLC % pred % 101  DLCO unc ml/min/mmHg 35.72  DLCO unc % pred % 108  DLCO cor ml/min/mmHg 35.72  DLCO cor % pred % 108  DL/VA ml/min/mmHg/L 1.61  DL/VA % pred % 096    Echocardiogram 07/24/2022:     1. Left ventricular ejection fraction, by estimation, is 60 to 65%. Left  ventricular ejection fraction by PLAX is 62 %. The left ventricle has  normal function. The left ventricle has no regional wall motion  abnormalities. There is severe asymmetric left   ventricular hypertrophy of the basal-septal segment. Left ventricular  diastolic parameters are consistent with Grade I diastolic dysfunction  (impaired relaxation).   2. Right ventricular systolic function is normal. The right ventricular  size is normal.   3. The mitral valve is abnormal. Mild mitral valve regurgitation.   4. The aortic valve is tricuspid. Aortic valve regurgitation is not  visualized.   Comparison(s): No significant change from prior study. 01/20/2020: LVEF  60-65%, mild MR.   On my review, there was evidence of mild left atrial dilation and there is no evidence of diastolic dysfunction  MV Decel Time: 264 msec      TR Vmax:        215.00 cm/s  MV E velocity: 58.20 cm/s    Estimated RAP:  3.00 mmHg  MV A velocity: 58.20 cm/s    RVSP:           21.5 mmHg  MV E/A ratio:  1.00  LV e' medial:    6.09 cm/s  LV E/e' medial:  9.6  LV e' lateral:   9.03 cm/s  LV E/e' lateral: 6.4   LA diam:        4.20 cm 1.75 cm/m    LA Vol (A2C):   76.4 ml 31.88 ml/m   LA  Vol (A4C):   57.6 ml 24.04 ml/m   LA Biplane Vol: 68.0 ml 28.38 ml/m    EKG:  EKG is ordered today.  It shows atrial paced, ventricular sensed rhythm with questionable left  atrial abnormality and borderline left axis deviation at -30 degrees.  Normal QTc 438 ms.  Not really changed from previous tracings.  The intracardiac electrogram throughout this appointment also shows atrial paced, ventricular sensed rhythm.    Recent Labs: 06/28/2022: Hemoglobin 15.9; Platelets 254 09/05/2022: Pro B Natriuretic peptide (BNP) 13.0 09/27/2022: TSH 1.050 01/03/2023: ALT 24; BUN 14; Creatinine, Ser 1.20; Potassium 5.2; Sodium 144  Labs from care everywhere  09/28/2021 TSH 1.799, free T40.62, negative thyroid peroxidase antibodies Vitamin D 25-hydroxy 23 (low) 12/11/2021 Creatinine 1.02, potassium 4.7  Recent Lipid Panel    Component Value Date/Time   CHOL 180 01/03/2023 1242   TRIG 110 01/03/2023 1242   HDL 60 01/03/2023 1242   CHOLHDL 3.0 01/03/2023 1242   LDLCALC 100 (H) 01/03/2023 1242  09/28/2021 Cholesterol 153, triglycerides 90, HDL 56, LDL 79  Physical Exam:    VS:  BP 118/82 (BP Location: Left Arm, Patient Position: Sitting, Cuff Size: Large)   Pulse 60   Ht 6\' 2"  (1.88 m)   Wt 257 lb 12.8 oz (116.9 kg)   SpO2 97%   BMI 33.10 kg/m     Wt Readings from Last 3 Encounters:  01/10/23 257 lb 12.8 oz (116.9 kg)  12/19/22 254 lb 2 oz (115.3 kg)  11/20/22 253 lb 12 oz (115.1 kg)      General: Alert, oriented x3, no distress, obese.  Healthy subclavian pacemaker site. Head: no evidence of trauma, PERRL, EOMI, no exophtalmos or lid lag, no myxedema, no xanthelasma; normal ears, nose and oropharynx Neck: normal jugular venous pulsations and no hepatojugular reflux; brisk carotid pulses without delay and no carotid bruits Chest: clear to auscultation, no signs of consolidation by percussion or palpation, normal fremitus, symmetrical and full respiratory excursions Cardiovascular: normal position and quality of the apical impulse, regular rhythm, normal first and second heart sounds, no murmurs, rubs or gallops Abdomen: no tenderness or distention, no masses by  palpation, no abnormal pulsatility or arterial bruits, normal bowel sounds, no hepatosplenomegaly Extremities: no clubbing, cyanosis or edema; 2+ radial, ulnar and brachial pulses bilaterally; 2+ right femoral, posterior tibial and dorsalis pedis pulses; 2+ left femoral, posterior tibial and dorsalis pedis pulses; no subclavian or femoral bruits Neurological: grossly nonfocal Psych: Normal mood and affect     ASSESSMENT:    1. SSS (sick sinus syndrome) (HCC)   2. Pacemaker   3. PAF (paroxysmal atrial fibrillation) (HCC)   4. OSA (obstructive sleep apnea)   5. Hypercholesterolemia   6. Acquired hypothyroidism      PLAN:    In order of problems listed above:  SSS: Improved exercise capacity following the pacemaker sensor settings a few months ago. AFib: Extremely low burden, less than 1 minute in the last 12 months.  CHA2DS2-VASc score is at most 1.  No indication for full anticoagulation at this point.  The arrhythmia may be related to his underlying conduction system disease or to a genetic predisposition.  He does have a mildly dilated left atrium. Pacemaker:   Virtually 100% atrial paced, ventricular sensed rhythm.  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 OSA: Scheduled for inspire device with Dr. Jenne Pane in September. HLP: On statin.  Target  LDL less than 70 based on coronary CT findings.  Not at target based on labs performed a few days ago.  This is largely a change related to weight gain.  Would like to add Zetia 10 mg once daily and recheck in several months. Hypothyroidism: TSH is normal on current dose of levothyroxine.  Clinically euthyroid. Preop CV eval: No risk for the planned inspire procedure.   Medication Adjustments/Labs and Tests Ordered: Current medicines are reviewed at length with the patient today.  Concerns regarding medicines are outlined  above.  Orders Placed This Encounter  Procedures   EKG 12-Lead   No orders of the defined types were placed in this encounter.   Patient Instructions  Medication Instructions:  No changes *If you need a refill on your cardiac medications before your next appointment, please call your pharmacy*  Follow-Up: At [facility], you and your health needs are our priority.  As part of our continuing mission to provide you with exceptional heart care, we have created designated Provider Care Teams.  These Care Teams include your primary Cardiologist (physician) and Advanced Practice Providers (APPs -  Physician Assistants and Nurse Practitioners) who all work together to provide you with the care you need, when you need it.  We recommend signing up for the patient portal called "MyChart".  Sign up information is provided on this After Visit Summary.  MyChart is used to connect with patients for Virtual Visits (Telemedicine).  Patients are able to view lab/test results, encounter notes, upcoming appointments, etc.  Non-urgent messages can be sent to your provider as well.   To learn more about what you can do with MyChart, go to [email or web address].    Your next appointment:   1 year(s)  Provider:   Dr Royann Shivers    Signed, Thurmon Fair, MD  01/10/2023 6:31 PM    [facility]

## 2023-01-11 NOTE — Telephone Encounter (Signed)
I sent Dr. Jenne Pane the letter yesterday.

## 2023-01-16 ENCOUNTER — Encounter: Payer: Self-pay | Admitting: Physician Assistant

## 2023-01-21 ENCOUNTER — Ambulatory Visit (HOSPITAL_COMMUNITY)
Admission: RE | Admit: 2023-01-21 | Discharge: 2023-01-21 | Disposition: A | Discharge status: Home / Self Care | Payer: Medicare HMO | Source: Ambulatory Visit | Attending: Physical Medicine and Rehabilitation | Admitting: Physical Medicine and Rehabilitation

## 2023-01-21 ENCOUNTER — Ambulatory Visit (HOSPITAL_COMMUNITY): Admission: RE | Admit: 2023-01-21 | Payer: Medicare HMO | Source: Ambulatory Visit

## 2023-01-21 DIAGNOSIS — M40205 Unspecified kyphosis, thoracolumbar region: Secondary | ICD-10-CM | POA: Diagnosis not present

## 2023-01-21 DIAGNOSIS — R413 Other amnesia: Secondary | ICD-10-CM | POA: Diagnosis not present

## 2023-01-21 DIAGNOSIS — I6782 Cerebral ischemia: Secondary | ICD-10-CM | POA: Diagnosis not present

## 2023-01-21 DIAGNOSIS — M47816 Spondylosis without myelopathy or radiculopathy, lumbar region: Secondary | ICD-10-CM | POA: Diagnosis not present

## 2023-01-21 DIAGNOSIS — M5416 Radiculopathy, lumbar region: Secondary | ICD-10-CM | POA: Insufficient documentation

## 2023-01-21 DIAGNOSIS — M48061 Spinal stenosis, lumbar region without neurogenic claudication: Secondary | ICD-10-CM | POA: Diagnosis not present

## 2023-01-21 DIAGNOSIS — M47817 Spondylosis without myelopathy or radiculopathy, lumbosacral region: Secondary | ICD-10-CM | POA: Diagnosis not present

## 2023-01-24 NOTE — Progress Notes (Signed)
Remote pacemaker transmission.   

## 2023-01-31 ENCOUNTER — Ambulatory Visit: Payer: Medicare HMO | Admitting: Physician Assistant

## 2023-02-04 DIAGNOSIS — M5416 Radiculopathy, lumbar region: Secondary | ICD-10-CM | POA: Diagnosis not present

## 2023-02-05 ENCOUNTER — Ambulatory Visit: Payer: Medicare HMO | Admitting: Physician Assistant

## 2023-02-05 ENCOUNTER — Encounter: Payer: Self-pay | Admitting: Physician Assistant

## 2023-02-05 VITALS — BP 126/74 | HR 62 | Resp 20 | Ht 74.0 in | Wt 262.0 lb

## 2023-02-05 DIAGNOSIS — R413 Other amnesia: Secondary | ICD-10-CM | POA: Diagnosis not present

## 2023-02-05 DIAGNOSIS — R41840 Attention and concentration deficit: Secondary | ICD-10-CM | POA: Diagnosis not present

## 2023-02-05 NOTE — Progress Notes (Signed)
Assessment/Plan:   Memory Difficulties of multiple etiologies Attention and concentration deficit   Mark Strong is a very pleasant 52 y.o. RH male with a history of hypertension, hyperlipidemia,  h/o SSS s/p PMP, PAF, CAD, vitamin D deficiency, sleep apnea, hypothyroidism, ADHD, GAD, depression  presenting today in follow-up for discussion of the latest MRI of the brain results.  In 2021, the patient had a neurocognitive testing showing evidence of mild dysfunction primarily involving the executive control, with etiology felt most likely due to modifiable risk factors and contributors such as chronic pain, multiple medical conditions, anxiety, and medication effect, in addition to his history of ADHD.  Latest neuropsychological evaluation at [facility] 10/23/2021 indicated reassuring findings, with normal performance on objective assessment, without patterns concerning for cognitive decline but consistent with attention and concentration deficit. Most recent MRI of the brain from August 2024, is without acute intracranial abnormalities, with minimal chronic microvascular changes, but without atrophy noted, and with normal volume for age.  Because of his memory difficulty, is unlikely to be due to neurodegenerative disease.  In today's visit, the patient is alert, oriented, and conversant.  He is able to participate in all of his ADLs and continues to drive.  At this moment, there is no indication for antidementia medication, although if memory were to be worse, further recommendations are to follow.   Recommendations:   Follow up as needed Continue to control mood as per PCP and behavioral health/psychiatry. Recommend relaxation techniques Continue pain control at the pain clinic, monitor for polypharmacy. Continue CPAP for OSA, the patient is scheduled in the near future for inspire Continue to replenish B12 Recommend good control of cardiovascular risk factors    Initial visit  09/04/22  How long did patient have memory difficulties?  He reports having memory difficulties at least dating back to 2020, but over the last 2 years, he feels that these difficulties are worse.  He reports that reads and does not recall the material, does not remember the speeches at church. Tries audio books and falls asleep.  Sometimes he does not remember recent conversations with he is mother.  He is concerned that his long-term memory may be affected as well, as he may experience difficulty remembering things from the remote past.  He is also concerned because he needs more time to think and answer. repeats oneself?  Denies Disoriented when walking into a room?  Patient denies.  Leaving objects in unusual places?  He reports one time leaving dishes in the fridge but he caught himself. May find himself more distracted   Wandering behavior?  denies   Any personality changes since last visit?  Patient denies   Any history of depression?:  Patient denies   Hallucinations or paranoia?  Patient denies hallucinations  Seizures?   Patient denies    Any sleep changes? Sleeps well but has a "hard time using the CPAP ", he is considering having a repeat study for other devices options. A couple of times he had "premonitions during the dreams". denies REM behavior or sleepwalking   Sleep apnea? Uses CPAP although he finds it difficult to use  Any hygiene concerns?  Patient denies   Independent of bathing and dressing?  Endorsed  Does the patient needs help with medications? Patient is in charge   Who is in charge of the finances?  Patient is in charge     Any changes in appetite? Lately he skips meals, because he is not hungry  Patient  have trouble swallowing? denies   Does the patient cook?  No issues Any kitchen accidents such as leaving the stove on? denies   Any headaches?   denies   Chronic pain?  Endorsed, he has chronic pain of the back, neck,feet, knees and wrist, and sees   Dr. Regino Schultze Gaylord Shih   Ambulates with difficulty?  denies   Recent falls or head injuries?  Not recently, in the past he fell off the ladder, injuring several bones, followed by orthopedics, and is requiring injections Vision changes? denies   Unilateral weakness, numbness or tingling? denies   Any tremors? "  got better, they are almost not there"  no intention tremors (never took propranolol because there were no debilitating) Any anosmia?  denies   Any incontinence of urine? denies   Any bowel dysfunction?  denies      Patient lives  by himself  History of heavy alcohol intake? denies   History of heavy tobacco use?  denies   Family history of dementia? Father had dementia of unknown etiology Does patient drive? He does not drive because of performance anxiety.    Recent labs T4 1.07, TSH 2.98 normal CMP, normal CBC, vitamin D was 23 in the recent past, he is now on replenishment   MRI of the brain on 01/31/2023 personally reviewed without acute intracranial process, no acute infarct, or hemorrhage or hydrocephalus.  There is minimal chronic microvascular ischemic changes nonspecific etiology for memory loss has been identified.  Past Medical History:  Diagnosis Date   Allergy Dec1,2017   Anxiety    Arthritis 2018   Depression    Dysrhythmia    Heart murmur 2012   Memory loss    Pacemaker    PVNS (pigmented villonodular synovitis)    Sciatica    Sleep apnea    Thyroid disease    Varicose veins of right leg with edema      Past Surgical History:  Procedure Laterality Date   CARDIAC CATHETERIZATION     left and right    DRUG INDUCED ENDOSCOPY Bilateral 11/20/2022   Procedure: DRUG INDUCED SLEEP  ENDOSCOPY;  Surgeon: Christia Reading, MD;  Location: [facility];  Service: ENT;  Laterality: Bilateral;   INSERT / REPLACE / REMOVE PACEMAKER     Biotronik, dual-chamber, 05/18/2016 for SSS   Right greater saphenous vein ablation Right      PREVIOUS MEDICATIONS:   CURRENT MEDICATIONS:   Outpatient Encounter Medications as of 02/05/2023  Medication Sig   albuterol (VENTOLIN HFA) 108 (90 Base) MCG/ACT inhaler Inhale 2 puffs into the lungs every 6 (six) hours as needed for wheezing or shortness of breath. (Patient not taking: Reported on 01/10/2023)   aspirin EC 81 MG tablet Take 81 mg by mouth once.   atomoxetine (STRATTERA) 40 MG capsule Take 40 mg by mouth daily.   atorvastatin (LIPITOR) 40 MG tablet Take 1 tablet (40 mg total) by mouth daily.   BD DISP NEEDLES 25G X 5/8" MISC    Blood Pressure Monitoring (BLOOD PRESSURE CUFF) MISC 1 Device by Does not apply route once for 1 dose.   buPROPion (WELLBUTRIN XL) 300 MG 24 hr tablet Take 300 mg by mouth daily.   cyanocobalamin (VITAMIN B12) 1000 MCG/ML injection Inject 1,000 mcg into the muscle every 30 (thirty) days.   Docusate Sodium (COLACE PO) Take by mouth.   DULoxetine (CYMBALTA) 60 MG capsule Take 60 mg by mouth 2 (two) times daily.   ezetimibe (ZETIA) 10 MG  tablet Take 1 tablet (10 mg total) by mouth daily.   finasteride (PROSCAR) 5 MG tablet Take 1 tablet by mouth daily.   ibuprofen (ADVIL) 800 MG tablet Take 800 mg by mouth 3 (three) times daily.   levothyroxine (SYNTHROID) 75 MCG tablet Take 1 tablet (75 mcg total) by mouth every morning.   sildenafil (VIAGRA) 25 MG tablet Take 25 mg by mouth as needed.   traZODone (DESYREL) 50 MG tablet Take 50 mg by mouth at bedtime as needed.   Vitamin D, Ergocalciferol, (DRISDOL) 1.25 MG (50000 UNIT) CAPS capsule Take 50,000 Units by mouth once a week.   zolpidem (AMBIEN) 5 MG tablet Take 1 tablet (5 mg total) by mouth at bedtime as needed for sleep. (Patient not taking: Reported on 01/10/2023)   No facility-administered encounter medications on file as of 02/05/2023.         09/04/2022    9:00 AM 09/01/2019    9:00 AM  Montreal Cognitive Assessment   Visuospatial/ Executive (0/5) 4 3  Naming (0/3) 3 3  Attention: Read list of digits (0/2) 2 2  Attention: Read list of letters  (0/1) 1 1  Attention: Serial 7 subtraction starting at 100 (0/3) 3 3  Language: Repeat phrase (0/2) 2 2  Language : Fluency (0/1) 1 1  Abstraction (0/2) 1 2  Delayed Recall (0/5) 4 5  Orientation (0/6) 6 6  Total 27 28  Adjusted Score (based on education) 28 29        No data to display             Total time spent on today's visit was 25 minutes dedicated to this patient today, preparing to see patient, examining the patient, ordering tests and/or medications and counseling the patient, documenting clinical information in the EHR or other health record, independently interpreting results and communicating results to the patient/family, discussing treatment and goals, answering patient's questions and coordinating care.  Cc:  Georgina Quint, MD  Marlowe Kays 02/05/2023 7:09 AM

## 2023-02-05 NOTE — Patient Instructions (Addendum)
Follow up as needed  Continue to control mood  consider  behavioral health/psychiatry. Recommend relaxation techniques Continue pain control at the pain clinic, monitor for polypharmacy. Continue treatment for sleep apnea  Replenish B12 Recommend good control of cardiovascular risk factors

## 2023-02-12 ENCOUNTER — Encounter: Payer: Self-pay | Admitting: Cardiovascular Disease

## 2023-02-12 ENCOUNTER — Ambulatory Visit: Payer: Medicare HMO | Admitting: Emergency Medicine

## 2023-02-12 VITALS — BP 126/98 | HR 61 | Temp 98.1°F | Wt 258.0 lb

## 2023-02-12 DIAGNOSIS — R413 Other amnesia: Secondary | ICD-10-CM | POA: Diagnosis not present

## 2023-02-12 DIAGNOSIS — N281 Cyst of kidney, acquired: Secondary | ICD-10-CM | POA: Diagnosis not present

## 2023-02-12 DIAGNOSIS — Z23 Encounter for immunization: Secondary | ICD-10-CM

## 2023-02-12 DIAGNOSIS — E039 Hypothyroidism, unspecified: Secondary | ICD-10-CM | POA: Diagnosis not present

## 2023-02-12 DIAGNOSIS — G4733 Obstructive sleep apnea (adult) (pediatric): Secondary | ICD-10-CM | POA: Diagnosis not present

## 2023-02-12 DIAGNOSIS — G8929 Other chronic pain: Secondary | ICD-10-CM | POA: Diagnosis not present

## 2023-02-12 DIAGNOSIS — M549 Dorsalgia, unspecified: Secondary | ICD-10-CM | POA: Diagnosis not present

## 2023-02-12 MED ORDER — FINASTERIDE 5 MG PO TABS: 5.0000 mg | ORAL_TABLET | Freq: Every day | ORAL | 3 refills | Status: DC

## 2023-02-12 NOTE — Assessment & Plan Note (Signed)
Recently evaluated by neurologist Normal brain MRI

## 2023-02-12 NOTE — Progress Notes (Signed)
Mark Strong 52 y.o.   Chief Complaint  Patient presents with   Medical Management of Chronic Issues    Mri follow up, possible renal cyst, referral to nephroligist    HISTORY OF PRESENT ILLNESS: This is a 52 y.o. male here for follow-up involving incidental finding on lumbar spine MRI regarding simple renal cysts. Since our last visit he was able to follow-up with his orthopedist and neurologist.  Office visit notes reviewed. Recent MRI of brain and lumbar spine report results reviewed with patient.  HPI   Prior to Admission medications   Medication Sig Start Date End Date Taking? Authorizing Provider  albuterol (VENTOLIN HFA) 108 (90 Base) MCG/ACT inhaler Inhale 2 puffs into the lungs every 6 (six) hours as needed for wheezing or shortness of breath. 09/05/22  Yes Mark Person, MD  aspirin EC 81 MG tablet Take 81 mg by mouth once. 05/04/20  Yes [provider]  atomoxetine (STRATTERA) 40 MG capsule Take 40 mg by mouth daily. 09/12/22  Yes [provider]  atorvastatin (LIPITOR) 40 MG tablet Take 1 tablet (40 mg total) by mouth daily. 06/29/22  Yes [name], [name], MD  BD DISP NEEDLES 25G X 5/8" MISC  09/06/22  Yes [provider]  buPROPion (WELLBUTRIN XL) 300 MG 24 hr tablet Take 300 mg by mouth daily. 09/12/22  Yes [provider]  cyanocobalamin (VITAMIN B12) 1000 MCG/ML injection Inject 1,000 mcg into the muscle every 30 (thirty) days. 09/06/22  Yes [provider]  Docusate Sodium (COLACE PO) Take by mouth.   Yes [provider]  DULoxetine (CYMBALTA) 60 MG capsule Take 60 mg by mouth 2 (two) times daily. 11/07/20  Yes [provider]  ezetimibe (ZETIA) 10 MG tablet Take 1 tablet (10 mg total) by mouth daily. 01/10/23 04/10/23 Yes [name], [name], MD  finasteride (PROSCAR) 5 MG tablet Take 1 tablet by mouth daily. 10/22/20  Yes [provider]  ibuprofen (ADVIL) 800 MG tablet Take 800 mg by mouth 3  (three) times daily. 02/24/20  Yes [provider]  levothyroxine (SYNTHROID) 75 MCG tablet Take 1 tablet (75 mcg total) by mouth every morning. 12/19/22  Yes Mark Ballo, [name], MD  sildenafil (VIAGRA) 25 MG tablet Take 25 mg by mouth as needed. 06/08/21  Yes [provider]  traZODone (DESYREL) 50 MG tablet Take 50 mg by mouth at bedtime as needed. 06/20/22  Yes [provider]  Vitamin D, Ergocalciferol, (DRISDOL) 1.25 MG (50000 UNIT) CAPS capsule Take 50,000 Units by mouth once a week.   Yes [provider]  Blood Pressure Monitoring (BLOOD PRESSURE CUFF) MISC 1 Device by Does not apply route once for 1 dose. 10/15/18 02/05/23  [name], Mark Hora, MD    Allergies  Allergen Reactions   Betadine [Povidone Iodine] Rash    Patient Active Problem List   Diagnosis Date Noted   Chronic anxiety 12/19/2022   Chronic depression 12/19/2022   Memory difficulties 09/04/2022   Varicose veins of both lower extremities 03/13/2019   Atypical atrial flutter (HCC) 12/18/2018   Hyperprolactinemia (HCC) 10/28/2018   Hypercholesterolemia 10/15/2018   SSS (sick sinus syndrome) (HCC) 08/20/2018   Pacemaker 05/28/2018   OSA (obstructive sleep apnea) 05/28/2018   Chondromalacia of left knee 05/23/2018   Chondromalacia of right patella 05/23/2018   Hypothyroidism 04/28/2018   Right thyroid nodule 04/28/2018   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   Chronic back pain 03/14/2018   Bradycardia 03/14/2018    Past Medical History:  Diagnosis Date  Allergy XBM8,4132   Anxiety    Arthritis 2018   Depression    Dysrhythmia    Heart murmur 2012   Memory loss    Pacemaker    PVNS (pigmented villonodular synovitis)    Sciatica    Sleep apnea    Thyroid disease    Varicose veins of right leg with edema     Past Surgical History:  Procedure Laterality Date   CARDIAC CATHETERIZATION     left and right    DRUG INDUCED ENDOSCOPY Bilateral 11/20/2022   Procedure:  DRUG INDUCED SLEEP  ENDOSCOPY;  Surgeon: Mark Reading, MD;  Location: [facility];  Service: ENT;  Laterality: Bilateral;   INSERT / REPLACE / REMOVE PACEMAKER     Biotronik, dual-chamber, 05/18/2016 for SSS   Right greater saphenous vein ablation Right     Social History   Socioeconomic History   Marital status: Single    Spouse name: Not on file   Number of children: 0   Years of education: 12   Highest education level: 12th grade  Occupational History   Not on file  Tobacco Use   Smoking status: Never    Passive exposure: Past   Smokeless tobacco: Never  Vaping Use   Vaping status: Never Used  Substance and Sexual Activity   Alcohol use: Never   Drug use: Never   Sexual activity: Not Currently    Birth control/protection: Condom  Other Topics Concern   Not on file  Social History Narrative   Right handed   Sugar free    Lives alone   Disabled    One floor home   Social Determinants of Health   Financial Resource Strain: Low Risk  (02/12/2023)   Overall Financial Resource Strain (CARDIA)    Difficulty of Paying Living Expenses: Not very hard  Food Insecurity: No Food Insecurity (02/12/2023)   Hunger Vital Sign    Worried About Running Out of Food in the Last Year: Never true    Ran Out of Food in the Last Year: Never true  Transportation Needs: No Transportation Needs (02/12/2023)   PRAPARE - Administrator, Civil Service (Medical): No    Lack of Transportation (Non-Medical): No  Physical Activity: Insufficiently Active (02/12/2023)   Exercise Vital Sign    Days of Exercise per Week: 2 days    Minutes of Exercise per Session: 20 min  Stress: Stress Concern Present (02/12/2023)   [employer] of Occupational Health - Occupational Stress Questionnaire    Feeling of Stress : To some extent  Social Connections: Socially Isolated (02/12/2023)   Social Connection and Isolation Panel [NHANES]    Frequency of Communication with Friends and  Family: Never    Frequency of Social Gatherings with Friends and Family: Never    Attends Religious Services: More than 4 times per year    Active Member of [employer] or Organizations: No    Attends Engineer, structural: Not on file    Marital Status: Never married  Catering manager Violence: Not on file    Family History  Problem Relation Age of Onset   Arthritis Mother    Depression Mother    Heart disease Mother    Obesity Mother    Arthritis Father    COPD Father    Diabetes Father    Hyperlipidemia Father    Hypertension Father    Kidney disease Father    Thyroid disease Father    Cancer Brother  Review of Systems  Constitutional: Negative.  Negative for chills and fever.  HENT: Negative.  Negative for congestion and sore throat.   Respiratory: Negative.  Negative for cough and shortness of breath.   Cardiovascular: Negative.  Negative for chest pain and palpitations.  Gastrointestinal:  Negative for abdominal pain, diarrhea, nausea and vomiting.  Genitourinary: Negative.  Negative for dysuria and hematuria.  Skin: Negative.  Negative for rash.  Neurological: Negative.  Negative for dizziness and headaches.  All other systems reviewed and are negative.   Today's Vitals   02/12/23 1022  BP: (!) 126/98  Pulse: 61  Temp: 98.1 F (36.7 C)  TempSrc: Oral  SpO2: 100%  Weight: 258 lb (117 kg)   Body mass index is 33.13 kg/m.   Physical Exam Vitals reviewed.  Constitutional:      Appearance: Normal appearance.  HENT:     Head: Normocephalic.  Eyes:     Extraocular Movements: Extraocular movements intact.  Cardiovascular:     Rate and Rhythm: Normal rate.  Pulmonary:     Effort: Pulmonary effort is normal.  Skin:    General: Skin is warm and dry.  Neurological:     Mental Status: He is alert and oriented to Strong, place, and time.  Psychiatric:        Mood and Affect: Mood normal.        Behavior: Behavior normal.    MR BRAIN WO  CONTRAST  Result Date: 01/31/2023 CLINICAL DATA:  Memory loss EXAM: MRI HEAD WITHOUT CONTRAST TECHNIQUE: Multiplanar, multiecho pulse sequences of the brain and surrounding structures were obtained without intravenous contrast. COMPARISON:  None Available. FINDINGS: Brain: Negative for an acute infarct. No hemorrhage. No hydrocephalus. No extra-axial fluid collection. There is sequela of minimal chronic microvascular ischemic change. Vascular: Normal flow voids. Skull and upper cervical spine: Normal marrow signal. Sinuses/Orbits: No middle ear or mastoid effusion. Mild mucosal thickening in the bilateral ethmoid sinuses. Orbits are unremarkable. Other: None. IMPRESSION: No acute intracranial process. No specific etiology for memory loss identified. Electronically Signed   By: Lorenza Cambridge M.D.   On: 01/31/2023 07:30   MR LUMBAR SPINE WO CONTRAST  Result Date: 01/31/2023 CLINICAL DATA:  Low back pain EXAM: MRI LUMBAR SPINE WITHOUT CONTRAST TECHNIQUE: Multiplanar, multisequence MR imaging of the lumbar spine was performed. No intravenous contrast was administered. COMPARISON:  None Available. FINDINGS: Segmentation:  Standard. Alignment:  There is focal kyphosis centered at the T12-L1 level. Vertebrae: No acute fracture, evidence of discitis, or bone lesion. There are likely chronic compression deformities of the inferior and superior endplates of T12 and L1 resulting in focal kyphosis at the T12-L1 level. There is mild bulging of the posterior cortex of L1. Conus medullaris and cauda equina: Conus extends to the L2 level. Conus and cauda equina appear normal. Paraspinal and other soft tissues: Bilateral T2 hyperintense renal lesions are favored to represent benign simple renal cysts requiring no further workup. Disc levels: T11-T12: Mild bilateral facet degenerative change. No significant disc bulge. No spinal canal narrowing. No neural foraminal narrowing. T12-L1: Mild posterior bulging of the posterior  cortex of L1. Mild spinal canal narrowing. Mild bilateral facet degenerative change. Mild right neural foraminal narrowing. L1-L2: Mild bilateral facet degenerative change. Minimal disc bulge. Mild spinal canal narrowing. No neural foraminal narrowing. L2-L3: Mild bilateral facet degenerative change with ligamentum flavum hypertrophy. Minimal disc bulge. Mild spinal canal narrowing. Mild bilateral neural foraminal narrowing. L3-L4: Moderate bilateral facet degenerative change. Minimal disc bulge. Mild spinal  canal narrowing. Mild bilateral neural foraminal narrowing. L4-L5: Moderate bilateral facet degenerative change. Minimal disc bulge with an annular fissure (series 4, image 10). Mild spinal canal narrowing. Mild bilateral neural foraminal narrowing. L5-S1: Severe bilateral facet degenerative change. No significant disc bulge. No spinal canal narrowing. Moderate to severe bilateral neural foraminal narrowing. IMPRESSION: 1. Likely chronic compression deformities of the inferior and superior endplates of T12 and L1 resulting in mild focal kyphosis at the T12-L1 disc space level. 2. Multilevel degenerative changes of the lumbar spine, most pronounced at L5-S1 where there is moderate to severe bilateral neural foraminal narrowing. 3. Annular fissure at L4-L5, which can be a source of pain. Electronically Signed   By: Lorenza Cambridge M.D.   On: 01/31/2023 07:06     ASSESSMENT & PLAN: A total of 33 minutes was spent with the patient and counseling/coordination of care regarding preparing for this visit, review of most recent office visit note, reviewed most recent orthopedic and neurology office visit notes, review of chronic medical conditions under management, review of all medications, review of most recent brain and lumbar spine MRI reports, prognosis, documentation, and need for follow-up.   Problem List Items Addressed This Visit       Respiratory   OSA (obstructive sleep apnea)    Scheduled for  inspire procedure next month        Endocrine   Hypothyroidism    Clinically euthyroid Lab Results  Component Value Date   TSH 1.050 09/27/2022  Continue Synthroid 75 mcg daily         Genitourinary   Bilateral renal cysts    Benign simple appearance.  Incidental finding on lumbar spine MRI No need for further workup        Other   Chronic back pain    Stable.  Sees orthopedist on a regular basis      Memory difficulties    Recently evaluated by neurologist Normal brain MRI      Other Visit Diagnoses     Need for vaccination    -  Primary      Patient Instructions  Health Maintenance, Male Adopting a healthy lifestyle and getting preventive care are important in promoting health and wellness. Ask your health care provider about: The right schedule for you to have regular tests and exams. Things you can do on your own to prevent diseases and keep yourself healthy. What should I know about diet, weight, and exercise? Eat a healthy diet  Eat a diet that includes plenty of vegetables, fruits, low-fat dairy products, and lean protein. Do not eat a lot of foods that are high in solid fats, added sugars, or sodium. Maintain a healthy weight Body mass index (BMI) is a measurement that can be used to identify possible weight problems. It estimates body fat based on height and weight. Your health care provider can help determine your BMI and help you achieve or maintain a healthy weight. Get regular exercise Get regular exercise. This is one of the most important things you can do for your health. Most adults should: Exercise for at least 150 minutes each week. The exercise should increase your heart rate and make you sweat (moderate-intensity exercise). Do strengthening exercises at least twice a week. This is in addition to the moderate-intensity exercise. Spend less time sitting. Even light physical activity can be beneficial. Watch cholesterol and blood lipids Have  your blood tested for lipids and cholesterol at 52 years of age, then have this test  every 5 years. You may need to have your cholesterol levels checked more often if: Your lipid or cholesterol levels are high. You are older than 52 years of age. You are at high risk for heart disease. What should I know about cancer screening? Many types of cancers can be detected early and may often be prevented. Depending on your health history and family history, you may need to have cancer screening at various ages. This may include screening for: Colorectal cancer. Prostate cancer. Skin cancer. Lung cancer. What should I know about heart disease, diabetes, and high blood pressure? Blood pressure and heart disease High blood pressure causes heart disease and increases the risk of stroke. This is more likely to develop in people who have high blood pressure readings or are overweight. Talk with your health care provider about your target blood pressure readings. Have your blood pressure checked: Every 3-5 years if you are 53-35 years of age. Every year if you are 56 years old or older. If you are between the ages of 1 and 52 and are a current or former smoker, ask your health care provider if you should have a one-time screening for abdominal aortic aneurysm (AAA). Diabetes Have regular diabetes screenings. This checks your fasting blood sugar level. Have the screening done: Once every three years after age 66 if you are at a normal weight and have a low risk for diabetes. More often and at a younger age if you are overweight or have a high risk for diabetes. What should I know about preventing infection? Hepatitis B If you have a higher risk for hepatitis B, you should be screened for this virus. Talk with your health care provider to find out if you are at risk for hepatitis B infection. Hepatitis C Blood testing is recommended for: Everyone born from [birth year] through 1965. Anyone with known risk  factors for hepatitis C. Sexually transmitted infections (STIs) You should be screened each year for STIs, including gonorrhea and chlamydia, if: You are sexually active and are younger than 52 years of age. You are older than 52 years of age and your health care provider tells you that you are at risk for this type of infection. Your sexual activity has changed since you were last screened, and you are at increased risk for chlamydia or gonorrhea. Ask your health care provider if you are at risk. Ask your health care provider about whether you are at high risk for HIV. Your health care provider may recommend a prescription medicine to help prevent HIV infection. If you choose to take medicine to prevent HIV, you should first get tested for HIV. You should then be tested every 3 months for as long as you are taking the medicine. Follow these instructions at home: Alcohol use Do not drink alcohol if your health care provider tells you not to drink. If you drink alcohol: Limit how much you have to 0-2 drinks a day. Know how much alcohol is in your drink. In the [place] one drink equals one 12 oz bottle of beer (355 mL), one 5 oz glass of wine (148 mL), or one 1 oz glass of hard liquor (44 mL). Lifestyle Do not use any products that contain nicotine or tobacco. These products include cigarettes, chewing tobacco, and vaping devices, such as e-cigarettes. If you need help quitting, ask your health care provider. Do not use street drugs. Do not share needles. Ask your health care provider for help if you need support or  information about quitting drugs. General instructions Schedule regular health, dental, and eye exams. Stay current with your vaccines. Tell your health care provider if: You often feel depressed. You have ever been abused or do not feel safe at home. Summary Adopting a healthy lifestyle and getting preventive care are important in promoting health and wellness. Follow your health  care provider's instructions about healthy diet, exercising, and getting tested or screened for diseases. Follow your health care provider's instructions on monitoring your cholesterol and blood pressure. This information is not intended to replace advice given to you by your health care provider. Make sure you discuss any questions you have with your health care provider. Document Revised: 10/24/2020 Document Reviewed: 10/24/2020 Elsevier Patient Education  2024 Elsevier Inc.    Edwina Barth, MD [facility]

## 2023-02-12 NOTE — Assessment & Plan Note (Signed)
Clinically euthyroid Lab Results  Component Value Date   TSH 1.050 09/27/2022  Continue Synthroid 75 mcg daily

## 2023-02-12 NOTE — Assessment & Plan Note (Signed)
Scheduled for inspire procedure next month

## 2023-02-12 NOTE — Assessment & Plan Note (Signed)
Benign simple appearance.  Incidental finding on lumbar spine MRI No need for further workup

## 2023-02-12 NOTE — Progress Notes (Signed)
PERIOPERATIVE PRESCRIPTION FOR IMPLANTED CARDIAC DEVICE PROGRAMMING  Patient Information: Name:  Mark Strong  DOB:  [birth year]  MRN:  [id number]    Planned Procedure:  Insertion of Hypoglossal Nerve Stimulator  Surgeon:  Dr Jenne Pane  Date of Procedure:  02-19-2023  Cautery will be used.  Position during surgery:  supine   Please send documentation back to:  [facility] (Fax # [phone])  Device Information:  Clinic EP Physician:  Dr. Melanee Spry [name]   Device Type:  Pacemaker Manufacturer and Phone #:  Biotronik: [phone] Pacemaker Dependent?:  Yes.   Date of Last Device Check:  01/08/2023 Normal Device Function?:  Yes.    Electrophysiologist's Recommendations:  Have magnet available. Provide continuous ECG monitoring when magnet is used or reprogramming is to be performed.  Procedure will likely interfere with device function.  Device should be programmed:  Asynchronous pacing during procedure and returned to normal programming after procedure  Per Device Clinic Standing Orders, Lenor Coffin, RN  10:35 AM 02/12/2023

## 2023-02-12 NOTE — Progress Notes (Signed)
Chart was reviewed with Dr Hyacinth Meeker and because patient has a pacemaker and procedure will likely interfere with function and need programming, he will need to be done at [facility]. Dr Jenne Pane office notified.

## 2023-02-12 NOTE — Assessment & Plan Note (Signed)
Stable.  Sees orthopedist on a regular basis

## 2023-02-12 NOTE — Patient Instructions (Signed)
Health Maintenance, Male Adopting a healthy lifestyle and getting preventive care are important in promoting health and wellness. Ask your health care provider about: The right schedule for you to have regular tests and exams. Things you can do on your own to prevent diseases and keep yourself healthy. What should I know about diet, weight, and exercise? Eat a healthy diet  Eat a diet that includes plenty of vegetables, fruits, low-fat dairy products, and lean protein. Do not eat a lot of foods that are high in solid fats, added sugars, or sodium. Maintain a healthy weight Body mass index (BMI) is a measurement that can be used to identify possible weight problems. It estimates body fat based on height and weight. Your health care provider can help determine your BMI and help you achieve or maintain a healthy weight. Get regular exercise Get regular exercise. This is one of the most important things you can do for your health. Most adults should: Exercise for at least 150 minutes each week. The exercise should increase your heart rate and make you sweat (moderate-intensity exercise). Do strengthening exercises at least twice a week. This is in addition to the moderate-intensity exercise. Spend less time sitting. Even light physical activity can be beneficial. Watch cholesterol and blood lipids Have your blood tested for lipids and cholesterol at 52 years of age, then have this test every 5 years. You may need to have your cholesterol levels checked more often if: Your lipid or cholesterol levels are high. You are older than 52 years of age. You are at high risk for heart disease. What should I know about cancer screening? Many types of cancers can be detected early and may often be prevented. Depending on your health history and family history, you may need to have cancer screening at various ages. This may include screening for: Colorectal cancer. Prostate cancer. Skin cancer. Lung  cancer. What should I know about heart disease, diabetes, and high blood pressure? Blood pressure and heart disease High blood pressure causes heart disease and increases the risk of stroke. This is more likely to develop in people who have high blood pressure readings or are overweight. Talk with your health care provider about your target blood pressure readings. Have your blood pressure checked: Every 3-5 years if you are 18-39 years of age. Every year if you are 40 years old or older. If you are between the ages of 65 and 75 and are a current or former smoker, ask your health care provider if you should have a one-time screening for abdominal aortic aneurysm (AAA). Diabetes Have regular diabetes screenings. This checks your fasting blood sugar level. Have the screening done: Once every three years after age 45 if you are at a normal weight and have a low risk for diabetes. More often and at a younger age if you are overweight or have a high risk for diabetes. What should I know about preventing infection? Hepatitis B If you have a higher risk for hepatitis B, you should be screened for this virus. Talk with your health care provider to find out if you are at risk for hepatitis B infection. Hepatitis C Blood testing is recommended for: Everyone born from 1945 through 1965. Anyone with known risk factors for hepatitis C. Sexually transmitted infections (STIs) You should be screened each year for STIs, including gonorrhea and chlamydia, if: You are sexually active and are younger than 52 years of age. You are older than 52 years of age and your   health care provider tells you that you are at risk for this type of infection. Your sexual activity has changed since you were last screened, and you are at increased risk for chlamydia or gonorrhea. Ask your health care provider if you are at risk. Ask your health care provider about whether you are at high risk for HIV. Your health care provider  may recommend a prescription medicine to help prevent HIV infection. If you choose to take medicine to prevent HIV, you should first get tested for HIV. You should then be tested every 3 months for as long as you are taking the medicine. Follow these instructions at home: Alcohol use Do not drink alcohol if your health care provider tells you not to drink. If you drink alcohol: Limit how much you have to 0-2 drinks a day. Know how much alcohol is in your drink. In the [place] one drink equals one 12 oz bottle of beer (355 mL), one 5 oz glass of wine (148 mL), or one 1 oz glass of hard liquor (44 mL). Lifestyle Do not use any products that contain nicotine or tobacco. These products include cigarettes, chewing tobacco, and vaping devices, such as e-cigarettes. If you need help quitting, ask your health care provider. Do not use street drugs. Do not share needles. Ask your health care provider for help if you need support or information about quitting drugs. General instructions Schedule regular health, dental, and eye exams. Stay current with your vaccines. Tell your health care provider if: You often feel depressed. You have ever been abused or do not feel safe at home. Summary Adopting a healthy lifestyle and getting preventive care are important in promoting health and wellness. Follow your health care provider's instructions about healthy diet, exercising, and getting tested or screened for diseases. Follow your health care provider's instructions on monitoring your cholesterol and blood pressure. This information is not intended to replace advice given to you by your health care provider. Make sure you discuss any questions you have with your health care provider. Document Revised: 10/24/2020 Document Reviewed: 10/24/2020 Elsevier Patient Education  2024 Elsevier Inc.  

## 2023-02-20 ENCOUNTER — Encounter (HOSPITAL_COMMUNITY): Payer: Self-pay | Admitting: Otolaryngology

## 2023-02-20 NOTE — Progress Notes (Signed)
Anesthesia Chart Review: Same-day workup  Follows with cardiology for history of sinus node dysfunction s/p dual-chamber PPM implanted 2017, dismal A-fib (extremely low burden, less than 1 minute in the last 12 months, no indication for anticoagulation at this point) OSA on CPAP, nonobstructive CAD.  Last seen by Dr. Royann Shivers 01/10/2023, doing well at that time and upcoming inspire implant was discussed.  Per note, "Preop CV eval: No risk for the planned inspire procedure."  Pt will need DOS labs and eval.  EKG 01/10/2023: Atrial paced, ventricular sensed rhythm.  Perioperative prescription for implanted cardiac device programming per progress note 02/12/2023: Device Information:   Clinic EP Physician:  Dr. Melanee Spry [name]    Device Type:  Pacemaker Manufacturer and Phone #:  Biotronik: [phone] Pacemaker Dependent?:  Yes.   Date of Last Device Check:  01/08/2023     Normal Device Function?:  Yes.     Electrophysiologist's Recommendations:   Have magnet available. Provide continuous ECG monitoring when magnet is used or reprogramming is to be performed.  Procedure will likely interfere with device function.  Device should be programmed:  Asynchronous pacing during procedure and returned to normal programming after procedure    Zannie Cove [facility] Short Stay Center/Anesthesiology Phone [phone] 02/20/2023 9:59 AM

## 2023-02-20 NOTE — Anesthesia Preprocedure Evaluation (Signed)
Anesthesia Evaluation  Patient identified by MRN, date of birth, ID band Patient awake    Reviewed: Allergy & Precautions, NPO status , Patient's Chart, lab work & pertinent test results  Airway Mallampati: II  TM Distance: >3 FB Neck ROM: Full    Dental no notable dental hx. (+) Teeth Intact, Dental Advisory Given, Caps, Poor Dentition   Pulmonary sleep apnea and Continuous Positive Airway Pressure Ventilation    Pulmonary exam normal breath sounds clear to auscultation       Cardiovascular Normal cardiovascular exam+ dysrhythmias + pacemaker + Valvular Problems/Murmurs  Rhythm:Regular Rate:Normal     Neuro/Psych  PSYCHIATRIC DISORDERS Anxiety Depression     Neuromuscular disease    GI/Hepatic negative GI ROS, Neg liver ROS,,,  Endo/Other  Hypothyroidism  Obesity Hyperlipidemia  Renal/GU Renal disease  negative genitourinary   Musculoskeletal  (+) Arthritis , Osteoarthritis,    Abdominal   Peds  Hematology negative hematology ROS (+)   Anesthesia Other Findings BMI 31.0-31.9,adult  Obstructive Sleep Apnea    Reproductive/Obstetrics                             Anesthesia Physical Anesthesia Plan  ASA: 3  Anesthesia Plan: General   Post-op Pain Management: Minimal or no pain anticipated   Induction: Intravenous  PONV Risk Score and Plan: 2 and Treatment may vary due to age or medical condition  Airway Management Planned: Oral ETT  Additional Equipment: None  Intra-op Plan:   Post-operative Plan:   Informed Consent: I have reviewed the patients History and Physical, chart, labs and discussed the procedure including the risks, benefits and alternatives for the proposed anesthesia with the patient or authorized representative who has indicated his/her understanding and acceptance.     Dental advisory given  Plan Discussed with:   Anesthesia Plan Comments: (PAT note by Antionette Poles, PA-C: Follows with cardiology for history of sinus node dysfunction s/p dual-chamber PPM implanted 2017, dismal A-fib (extremely low burden, less than 1 minute in the last 12 months, no indication for anticoagulation at this point) OSA on CPAP, nonobstructive CAD.  Last seen by Dr. Royann Shivers 01/10/2023, doing well at that time and upcoming inspire implant was discussed.  Per note, "Preop CV eval: No risk for the planned inspire procedure."   EKG 01/10/2023: Atrial paced, ventricular sensed rhythm.  Perioperative prescription for implanted cardiac device programming per progress note 02/12/2023: Device Information:  Clinic EP Physician:  Dr. Melanee Spry [name]   Device Type:  Pacemaker Manufacturer and Phone #:  Biotronik: [phone] Pacemaker Dependent?:  Yes.   Date of Last Device Check:  01/08/2023     Normal Device Function?:  Yes.    Electrophysiologist's Recommendations:   Have magnet available.  Provide continuous ECG monitoring when magnet is used or reprogramming is to be performed.   Procedure will likely interfere with device function.  Device should be programmed:  Asynchronous pacing during procedure and returned to normal programming after procedure  )       Anesthesia Quick Evaluation

## 2023-02-20 NOTE — Progress Notes (Signed)
SDW call  Patient was given pre-op instructions over the phone. Patient verbalized understanding of instructions provided.     PCP - Dr. Edwina Barth Cardiologist - Dr. [name] [name] Pulmonary:    PPM/ICD - Yes, Biotronik Device Orders - Received Rep Notified - Yes, Pavan   Chest x-ray - n/a EKG -  06/28/2022 Stress Test -  ECHO - 07/24/2022 Cardiac Cath - 2016  Sleep Study/sleep apnea/CPAP: Diagnosed with OSA  Non-diabetic   Blood Thinner Instructions: denies Aspirin Instructions: states via message with Dr. Jenne Pane was told to continue his ASA   ERAS Protcol - Yes, clear fluids until 1100   COVID TEST- n/a    Anesthesia review:  Yes. Pacemaker, heart murmur, OSA,    Patient denies shortness of breath, fever, cough and chest pain over the phone call  Your procedure is scheduled on Thursday September 5, 20224  Report to [facility] Main Entrance "A" at 1130   A.M., then check in with the Admitting office.  Call this number if you have problems the morning of surgery:  [phone]   If you have any questions prior to your surgery date call [phone]: Open Monday-Friday 8am-4pm If you experience any cold or flu symptoms such as cough, fever, chills, shortness of breath, etc. between now and your scheduled surgery, please notify us at the above number     Remember:  Do not eat after midnight the night before your surgery  You may drink clear liquids until  1100   the morning of your surgery.   Clear liquids allowed are: Water, Non-Citrus Juices (without pulp), Carbonated Beverages, Clear Tea, Black Coffee ONLY (NO MILK, CREAM OR POWDERED CREAMER of any kind), and Gatorade   Take these medicines the morning of surgery with A SIP OF WATER:  Wellbutrin, cymbalta, zetia, levothyroxine, ASA  As needed: Albuterol  As of today, STOP taking any Aleve, Naproxen, Ibuprofen, Motrin, Advil, Goody's, BC's, all herbal medications, fish oil, and all vitamins.

## 2023-02-21 ENCOUNTER — Ambulatory Visit (HOSPITAL_COMMUNITY): Payer: Medicare HMO | Admitting: Physician Assistant

## 2023-02-21 ENCOUNTER — Other Ambulatory Visit: Payer: Self-pay

## 2023-02-21 ENCOUNTER — Encounter (HOSPITAL_COMMUNITY)
Admission: RE | Disposition: A | Discharge status: Home / Self Care | Payer: Self-pay | Source: Home / Self Care | Attending: Otolaryngology

## 2023-02-21 ENCOUNTER — Ambulatory Visit (HOSPITAL_COMMUNITY): Payer: Medicare HMO

## 2023-02-21 ENCOUNTER — Ambulatory Visit (HOSPITAL_COMMUNITY)
Admission: RE | Admit: 2023-02-21 | Discharge: 2023-02-21 | Disposition: A | Discharge status: Home / Self Care | Payer: Medicare HMO | Attending: Otolaryngology | Admitting: Otolaryngology

## 2023-02-21 ENCOUNTER — Encounter (HOSPITAL_COMMUNITY): Payer: Self-pay | Admitting: Otolaryngology

## 2023-02-21 DIAGNOSIS — Z950 Presence of cardiac pacemaker: Secondary | ICD-10-CM | POA: Insufficient documentation

## 2023-02-21 DIAGNOSIS — E669 Obesity, unspecified: Secondary | ICD-10-CM | POA: Diagnosis not present

## 2023-02-21 DIAGNOSIS — Z6832 Body mass index (BMI) 32.0-32.9, adult: Secondary | ICD-10-CM | POA: Diagnosis not present

## 2023-02-21 DIAGNOSIS — I484 Atypical atrial flutter: Secondary | ICD-10-CM | POA: Diagnosis not present

## 2023-02-21 DIAGNOSIS — F418 Other specified anxiety disorders: Secondary | ICD-10-CM | POA: Diagnosis not present

## 2023-02-21 DIAGNOSIS — G4733 Obstructive sleep apnea (adult) (pediatric): Secondary | ICD-10-CM | POA: Insufficient documentation

## 2023-02-21 DIAGNOSIS — Z9682 Presence of neurostimulator: Secondary | ICD-10-CM | POA: Diagnosis not present

## 2023-02-21 HISTORY — PX: IMPLANTATION OF HYPOGLOSSAL NERVE STIMULATOR: SHX6827

## 2023-02-21 LAB — CBC
HCT: 43.1 % (ref 39.0–52.0)
Hemoglobin: 14.7 g/dL (ref 13.0–17.0)
MCH: 29.9 pg (ref 26.0–34.0)
MCHC: 34.1 g/dL (ref 30.0–36.0)
MCV: 87.6 fL (ref 80.0–100.0)
Platelets: 214 10*3/uL (ref 150–400)
RBC: 4.92 MIL/uL (ref 4.22–5.81)
RDW: 13.5 % (ref 11.5–15.5)
WBC: 6 10*3/uL (ref 4.0–10.5)
nRBC: 0 % (ref 0.0–0.2)

## 2023-02-21 LAB — BASIC METABOLIC PANEL
Anion gap: 9 (ref 5–15)
BUN: 14 mg/dL (ref 6–20)
CO2: 26 mmol/L (ref 22–32)
Calcium: 9 mg/dL (ref 8.9–10.3)
Chloride: 104 mmol/L (ref 98–111)
Creatinine, Ser: 1.18 mg/dL (ref 0.61–1.24)
GFR, Estimated: >60 mL/min (ref >60)
Glucose, Bld: 84 mg/dL (ref 70–99)
Potassium: 3.8 mmol/L (ref 3.5–5.1)
Sodium: 139 mmol/L (ref 135–145)

## 2023-02-21 SURGERY — INSERTION, HYPOGLOSSAL NERVE STIMULATOR
Anesthesia: General | Site: Neck | Laterality: Right

## 2023-02-21 MED ORDER — LIDOCAINE-EPINEPHRINE 1 %-1:100000 IJ SOLN
INTRAMUSCULAR | Status: AC
  Filled 2023-02-21: qty 1

## 2023-02-21 MED ORDER — PHENYLEPHRINE 80 MCG/ML (10ML) SYRINGE FOR IV PUSH (FOR BLOOD PRESSURE SUPPORT)
PREFILLED_SYRINGE | INTRAVENOUS | Status: AC
  Filled 2023-02-21: qty 20

## 2023-02-21 MED ORDER — LIDOCAINE 2% (20 MG/ML) 5 ML SYRINGE
INTRAMUSCULAR | Status: AC
  Filled 2023-02-21: qty 10

## 2023-02-21 MED ORDER — LIDOCAINE HCL (CARDIAC) PF 100 MG/5ML IV SOSY
PREFILLED_SYRINGE | INTRAVENOUS | Status: DC | PRN
  Administered 2023-02-21: 40 mg via INTRATRACHEAL

## 2023-02-21 MED ORDER — OXYCODONE HCL 5 MG/5ML PO SOLN: 5.0000 mg | Freq: Once | ORAL | Status: AC | PRN

## 2023-02-21 MED ORDER — OXYCODONE HCL 5 MG PO TABS
5.0000 mg | ORAL_TABLET | Freq: Once | ORAL | Status: AC | PRN
  Administered 2023-02-21: 5 mg via ORAL

## 2023-02-21 MED ORDER — 0.9 % SODIUM CHLORIDE (POUR BTL) OPTIME
TOPICAL | Status: DC | PRN
  Administered 2023-02-21: 1000 mL

## 2023-02-21 MED ORDER — MIDAZOLAM HCL 2 MG/2ML IJ SOLN
INTRAMUSCULAR | Status: DC | PRN
Start: 2023-02-21 — End: 2023-02-21
  Administered 2023-02-21: 2 mg via INTRAVENOUS

## 2023-02-21 MED ORDER — MEPERIDINE HCL 25 MG/ML IJ SOLN: 6.2500 mg | INTRAMUSCULAR | Status: DC | PRN

## 2023-02-21 MED ORDER — LACTATED RINGERS IV SOLN
INTRAVENOUS | Status: DC | PRN
Start: 2023-02-21 — End: 2023-02-21

## 2023-02-21 MED ORDER — CEFAZOLIN SODIUM-DEXTROSE 2-4 GM/100ML-% IV SOLN
INTRAVENOUS | Status: AC
  Filled 2023-02-21: qty 100

## 2023-02-21 MED ORDER — ROCURONIUM BROMIDE 100 MG/10ML IV SOLN
INTRAVENOUS | Status: DC | PRN
Start: 2023-02-21 — End: 2023-02-21
  Administered 2023-02-21: 15 mg via INTRAVENOUS

## 2023-02-21 MED ORDER — SUCCINYLCHOLINE CHLORIDE 200 MG/10ML IV SOSY
PREFILLED_SYRINGE | INTRAVENOUS | Status: DC | PRN
Start: 2023-02-21 — End: 2023-02-21
  Administered 2023-02-21: 120 mg via INTRAVENOUS

## 2023-02-21 MED ORDER — CEFAZOLIN SODIUM-DEXTROSE 2-4 GM/100ML-% IV SOLN
2.0000 g | INTRAVENOUS | Status: AC
  Administered 2023-02-21: 2 g via INTRAVENOUS

## 2023-02-21 MED ORDER — PROPOFOL 10 MG/ML IV BOLUS
INTRAVENOUS | Status: DC | PRN
Start: 2023-02-21 — End: 2023-02-21
  Administered 2023-02-21: 140 mg via INTRAVENOUS

## 2023-02-21 MED ORDER — LACTATED RINGERS IV SOLN: INTRAVENOUS | Status: DC

## 2023-02-21 MED ORDER — ONDANSETRON HCL 4 MG/2ML IJ SOLN: 4.0000 mg | Freq: Once | INTRAMUSCULAR | Status: DC | PRN

## 2023-02-21 MED ORDER — LIDOCAINE-EPINEPHRINE 1 %-1:100000 IJ SOLN
INTRAMUSCULAR | Status: DC | PRN
  Administered 2023-02-21: 4.5 mL

## 2023-02-21 MED ORDER — ACETAMINOPHEN 325 MG PO TABS: 325.0000 mg | ORAL_TABLET | ORAL | Status: DC | PRN

## 2023-02-21 MED ORDER — HYDROCODONE-ACETAMINOPHEN 5-325 MG PO TABS: 1.0000 | ORAL_TABLET | Freq: Four times a day (QID) | ORAL | 0 refills | Status: DC | PRN

## 2023-02-21 MED ORDER — FENTANYL CITRATE (PF) 250 MCG/5ML IJ SOLN
INTRAMUSCULAR | Status: DC | PRN
  Administered 2023-02-21: 100 ug via INTRAVENOUS

## 2023-02-21 MED ORDER — FENTANYL CITRATE (PF) 100 MCG/2ML IJ SOLN
25.0000 ug | INTRAMUSCULAR | Status: DC | PRN
  Administered 2023-02-21 (×3): 25 ug via INTRAVENOUS

## 2023-02-21 MED ORDER — FENTANYL CITRATE (PF) 100 MCG/2ML IJ SOLN
INTRAMUSCULAR | Status: AC
  Filled 2023-02-21: qty 2

## 2023-02-21 MED ORDER — ACETAMINOPHEN 160 MG/5ML PO SOLN: 325.0000 mg | ORAL | Status: DC | PRN

## 2023-02-21 MED ORDER — ORAL CARE MOUTH RINSE: 15.0000 mL | Freq: Once | OROMUCOSAL | Status: AC

## 2023-02-21 MED ORDER — ONDANSETRON HCL 4 MG/2ML IJ SOLN
INTRAMUSCULAR | Status: DC | PRN
  Administered 2023-02-21: 4 mg via INTRAVENOUS

## 2023-02-21 MED ORDER — CHLORHEXIDINE GLUCONATE 0.12 % MT SOLN
OROMUCOSAL | Status: AC
  Administered 2023-02-21: 15 mL via OROMUCOSAL
  Filled 2023-02-21: qty 15

## 2023-02-21 MED ORDER — FENTANYL CITRATE (PF) 250 MCG/5ML IJ SOLN
INTRAMUSCULAR | Status: AC
  Filled 2023-02-21: qty 5

## 2023-02-21 MED ORDER — OXYCODONE HCL 5 MG PO TABS
ORAL_TABLET | ORAL | Status: AC
  Filled 2023-02-21: qty 1

## 2023-02-21 MED ORDER — CHLORHEXIDINE GLUCONATE 0.12 % MT SOLN: 15.0000 mL | Freq: Once | OROMUCOSAL | Status: AC

## 2023-02-21 MED ORDER — EPHEDRINE 5 MG/ML INJ
INTRAVENOUS | Status: AC
  Filled 2023-02-21: qty 15

## 2023-02-21 MED ORDER — PHENYLEPHRINE HCL (PRESSORS) 10 MG/ML IV SOLN
INTRAVENOUS | Status: DC | PRN
  Administered 2023-02-21 (×2): 80 ug via INTRAVENOUS

## 2023-02-21 MED ORDER — MIDAZOLAM HCL 2 MG/2ML IJ SOLN
INTRAMUSCULAR | Status: AC
  Filled 2023-02-21: qty 2

## 2023-02-21 MED ORDER — PROPOFOL 10 MG/ML IV BOLUS
INTRAVENOUS | Status: AC
  Filled 2023-02-21: qty 20

## 2023-02-21 MED ORDER — DEXAMETHASONE SODIUM PHOSPHATE 4 MG/ML IJ SOLN
INTRAMUSCULAR | Status: DC | PRN
  Administered 2023-02-21: 4 mg via INTRAVENOUS

## 2023-02-21 MED ORDER — ALBUMIN HUMAN 5 % IV SOLN: INTRAVENOUS | Status: DC | PRN

## 2023-02-21 SURGICAL SUPPLY — 70 items
ACC NRSTM 4 TRQ WRNCH STRL (MISCELLANEOUS)
ADH SKN CLS APL DERMABOND .7 (GAUZE/BANDAGES/DRESSINGS) ×2
BAG COUNTER SPONGE SURGICOUNT (BAG) ×1 IMPLANT
BAG SPNG CNTER NS LX DISP (BAG)
BLADE CLIPPER SURG (BLADE) IMPLANT
BLADE SURG 15 STRL LF DISP TIS (BLADE) ×3 IMPLANT
BLADE SURG 15 STRL SS (BLADE) ×2
CANISTER SUCT 3000ML PPV (MISCELLANEOUS) ×1 IMPLANT
CORD BIPOLAR FORCEPS 12FT (ELECTRODE) ×1 IMPLANT
COVER PROBE W GEL 5X96 (DRAPES) ×1 IMPLANT
COVER SURGICAL LIGHT HANDLE (MISCELLANEOUS) ×1 IMPLANT
DERMABOND ADVANCED .7 DNX12 (GAUZE/BANDAGES/DRESSINGS) ×2 IMPLANT
DRAPE C-ARM 35X43 STRL (DRAPES) ×1 IMPLANT
DRAPE HEAD BAR (DRAPES) ×1 IMPLANT
DRAPE INCISE IOBAN 66X45 STRL (DRAPES) ×1 IMPLANT
DRAPE MICROSCOPE LEICA 54X105 (DRAPES) ×1 IMPLANT
DRAPE UTILITY XL STRL (DRAPES) ×1 IMPLANT
DRSG TEGADERM 4X4.75 (GAUZE/BANDAGES/DRESSINGS) ×3 IMPLANT
ELECT COATED BLADE 2.86 ST (ELECTRODE) ×1 IMPLANT
ELECT EMG 18 NIMS (NEUROSURGERY SUPPLIES) ×1
ELECT REM PT RETURN 9FT ADLT (ELECTROSURGICAL) ×1
ELECTRODE EMG 18 NIMS (NEUROSURGERY SUPPLIES) ×1 IMPLANT
ELECTRODE REM PT RTRN 9FT ADLT (ELECTROSURGICAL) ×1 IMPLANT
FORCEPS BIPOLAR SPETZLER 8 1.0 (NEUROSURGERY SUPPLIES) ×1 IMPLANT
GAUZE 4X4 16PLY ~~LOC~~+RFID DBL (SPONGE) ×1 IMPLANT
GAUZE SPONGE 4X4 12PLY STRL (GAUZE/BANDAGES/DRESSINGS) ×1 IMPLANT
GENERATOR PULSE INSPIRE (Generator) ×1 IMPLANT
GENERATOR PULSE INSPIRE IV (Generator) ×1 IMPLANT
GLOVE BIO SURGEON STRL SZ 6.5 (GLOVE) IMPLANT
GLOVE BIO SURGEON STRL SZ7.5 (GLOVE) ×1 IMPLANT
GOWN STRL REUS W/ TWL LRG LVL3 (GOWN DISPOSABLE) ×3 IMPLANT
GOWN STRL REUS W/TWL LRG LVL3 (GOWN DISPOSABLE) ×5
KIT BASIN OR (CUSTOM PROCEDURE TRAY) ×1 IMPLANT
KIT NEURO ACCESSORY W/WRENCH (MISCELLANEOUS) IMPLANT
KIT TURNOVER KIT B (KITS) ×1 IMPLANT
LEAD SENSING RESP INSPIRE (Lead) ×1 IMPLANT
LEAD SENSING RESP INSPIRE IV (Lead) ×1 IMPLANT
LEAD SLEEP STIM INSPIRE IV/V (Lead) ×1 IMPLANT
LEAD SLEEP STIMULATION INSPIRE (Lead) ×1 IMPLANT
LOOP VASCLR MAXI BLUE 18IN ST (MISCELLANEOUS) ×1 IMPLANT
LOOP VASCULAR MAXI 18 BLUE (MISCELLANEOUS) ×1
LOOP VASCULAR MINI 18 RED (MISCELLANEOUS) ×1
MARKER SKIN DUAL TIP RULER LAB (MISCELLANEOUS) ×2 IMPLANT
NDL HYPO 25GX1X1/2 BEV (NEEDLE) ×1 IMPLANT
NEEDLE HYPO 25GX1X1/2 BEV (NEEDLE) ×1
NS IRRIG 1000ML POUR BTL (IV SOLUTION) ×1 IMPLANT
PAD ARMBOARD 7.5X6 YLW CONV (MISCELLANEOUS) ×1 IMPLANT
PASSER CATH 38CM DISP (INSTRUMENTS) ×1 IMPLANT
PENCIL SMOKE EVACUATOR (MISCELLANEOUS) ×1 IMPLANT
POSITIONER HEAD DONUT 9IN (MISCELLANEOUS) ×1 IMPLANT
PROBE NERVE STIMULATOR (NEUROSURGERY SUPPLIES) ×1 IMPLANT
REMOTE CONTROL SLEEP INSPIRE (MISCELLANEOUS) ×1 IMPLANT
SET WALTER ACTIVATION W/DRAPE (SET/KITS/TRAYS/PACK) ×1 IMPLANT
SPONGE INTESTINAL PEANUT (DISPOSABLE) ×1 IMPLANT
STAPLER VISISTAT 35W (STAPLE) ×1 IMPLANT
SUT SILK 2 0 SH (SUTURE) ×1 IMPLANT
SUT SILK 3 0 REEL (SUTURE) ×1 IMPLANT
SUT SILK 3 0 SH 30 (SUTURE) ×2 IMPLANT
SUT SILK 3-0 (SUTURE) ×1
SUT SILK 3-0 RB1 30XBRD (SUTURE) ×1
SUT VIC AB 3-0 SH 27 (SUTURE) ×2
SUT VIC AB 3-0 SH 27X BRD (SUTURE) ×2 IMPLANT
SUT VIC AB 4-0 PS2 27 (SUTURE) ×2 IMPLANT
SUTURE SILK 3-0 RB1 30XBRD (SUTURE) ×1 IMPLANT
SYR 10ML LL (SYRINGE) ×1 IMPLANT
TAPE CLOTH SURG 4X10 WHT LF (GAUZE/BANDAGES/DRESSINGS) ×1 IMPLANT
TOWEL GREEN STERILE (TOWEL DISPOSABLE) ×1 IMPLANT
TRAY ENT MC OR (CUSTOM PROCEDURE TRAY) ×1 IMPLANT
VASCULAR TIE MAXI BLUE 18IN ST (MISCELLANEOUS) ×1
VASCULAR TIE MINI RED 18IN STL (MISCELLANEOUS) ×1 IMPLANT

## 2023-02-21 NOTE — H&P (Signed)
Mark Strong is an 52 y.o. male.   Chief Complaint: Sleep apnea HPI: 52 year old male with sleep apnea who has not been able to tolerate CPAP.  Past Medical History:  Diagnosis Date   Allergy Dec1,2017   Anxiety    Arthritis 2018   Depression    Dysrhythmia    Heart murmur 2012   Memory loss    Pacemaker    PVNS (pigmented villonodular synovitis)    Sciatica    Sleep apnea    Thyroid disease    Varicose veins of right leg with edema     Past Surgical History:  Procedure Laterality Date   CARDIAC CATHETERIZATION     left and right    DRUG INDUCED ENDOSCOPY Bilateral 11/20/2022   Procedure: DRUG INDUCED SLEEP  ENDOSCOPY;  Surgeon: Christia Reading, MD;  Location: [facility];  Service: ENT;  Laterality: Bilateral;   INSERT / REPLACE / REMOVE PACEMAKER     Biotronik, dual-chamber, 05/18/2016 for SSS   Right greater saphenous vein ablation Right     Family History  Problem Relation Age of Onset   Arthritis Mother    Depression Mother    Heart disease Mother    Obesity Mother    Arthritis Father    COPD Father    Diabetes Father    Hyperlipidemia Father    Hypertension Father    Kidney disease Father    Thyroid disease Father    Cancer Brother    Social History:  reports that he has never smoked. He has been exposed to tobacco smoke. He has never used smokeless tobacco. He reports that he does not drink alcohol and does not use drugs.  Allergies:  Allergies  Allergen Reactions   Betadine [Povidone Iodine] Rash    Medications Prior to Admission  Medication Sig Dispense Refill   albuterol (VENTOLIN HFA) 108 (90 Base) MCG/ACT inhaler Inhale 2 puffs into the lungs every 6 (six) hours as needed for wheezing or shortness of breath. 8 g 6   aspirin EC 81 MG tablet Take 81 mg by mouth once.     atomoxetine (STRATTERA) 40 MG capsule Take 40 mg by mouth daily.     atorvastatin (LIPITOR) 40 MG tablet Take 1 tablet (40 mg total) by mouth daily. 90 tablet 3    buPROPion (WELLBUTRIN XL) 300 MG 24 hr tablet Take 300 mg by mouth daily.     cholecalciferol (VITAMIN D3) 25 MCG (1000 UNIT) tablet Take 1,000 Units by mouth daily.     cyanocobalamin (VITAMIN B12) 1000 MCG tablet Take 1,000 mcg by mouth daily.     docusate sodium (COLACE) 100 MG capsule Take 100 mg by mouth daily as needed (Constipation).     DULoxetine (CYMBALTA) 60 MG capsule Take 60 mg by mouth 2 (two) times daily.     ezetimibe (ZETIA) 10 MG tablet Take 1 tablet (10 mg total) by mouth daily. 90 tablet 3   finasteride (PROSCAR) 5 MG tablet Take 1 tablet (5 mg total) by mouth daily. 90 tablet 3   ibuprofen (ADVIL) 800 MG tablet Take 800 mg by mouth every 8 (eight) hours as needed for mild pain or moderate pain.     levothyroxine (SYNTHROID) 75 MCG tablet Take 1 tablet (75 mcg total) by mouth every morning. 90 tablet 3   traZODone (DESYREL) 50 MG tablet Take 25 mg by mouth at bedtime.     BD DISP NEEDLES 25G X 5/8" MISC  Blood Pressure Monitoring (BLOOD PRESSURE CUFF) MISC 1 Device by Does not apply route once for 1 dose. 1 each 0   sildenafil (VIAGRA) 25 MG tablet Take 25 mg by mouth as needed for erectile dysfunction.      Results for orders placed or performed during the hospital encounter of 02/21/23 (from the past 48 hour(s))  Basic metabolic panel per protocol     Status: None   Collection Time: 02/21/23 11:08 AM  Result Value Ref Range   Sodium 139 135 - 145 mmol/L   Potassium 3.8 3.5 - 5.1 mmol/L   Chloride 104 98 - 111 mmol/L   CO2 26 22 - 32 mmol/L   Glucose, Bld 84 70 - 99 mg/dL    Comment: Glucose reference range applies only to samples taken after fasting for at least 8 hours.   BUN 14 6 - 20 mg/dL   Creatinine, Ser 8.29 0.61 - 1.24 mg/dL   Calcium 9.0 8.9 - 56.2 mg/dL   GFR, Estimated >13 >08 mL/min    Comment: (NOTE) Calculated using the CKD-EPI Creatinine Equation (2021)    Anion gap 9 5 - 15    Comment: Performed at [facility], [address],  [place], [place] [address]  CBC per protocol     Status: None   Collection Time: 02/21/23 11:08 AM  Result Value Ref Range   WBC 6.0 4.0 - 10.5 K/uL   RBC 4.92 4.22 - 5.81 MIL/uL   Hemoglobin 14.7 13.0 - 17.0 g/dL   HCT 69.6 29.5 - 28.4 %   MCV 87.6 80.0 - 100.0 fL   MCH 29.9 26.0 - 34.0 pg   MCHC 34.1 30.0 - 36.0 g/dL   RDW 13.2 44.0 - 10.2 %   Platelets 214 150 - 400 K/uL   nRBC 0.0 0.0 - 0.2 %    Comment: Performed at [facility], [address], [place], [place] [address]   No results found.  Review of Systems  All other systems reviewed and are negative.   Blood pressure (!) 156/100, pulse 68, temperature 98.3 F (36.8 C), temperature source Oral, resp. rate 18, height 6\' 2"  (1.88 m), weight 115.7 kg, SpO2 98%. Physical Exam Constitutional:      Appearance: Normal appearance. He is normal weight.  HENT:     Head: Normocephalic and atraumatic.     Right Ear: External ear normal.     Left Ear: External ear normal.     Nose: Nose normal.     Mouth/Throat:     Mouth: Mucous membranes are moist.     Pharynx: Oropharynx is clear.  Eyes:     Extraocular Movements: Extraocular movements intact.     Conjunctiva/sclera: Conjunctivae normal.     Pupils: Pupils are equal, round, and reactive to light.  Cardiovascular:     Rate and Rhythm: Normal rate.  Pulmonary:     Effort: Pulmonary effort is normal.  Musculoskeletal:        General: Normal range of motion.     Cervical back: Normal range of motion.  Skin:    General: Skin is warm and dry.  Neurological:     General: No focal deficit present.     Mental Status: He is alert and oriented to person, place, and time.  Psychiatric:        Mood and Affect: Mood normal.        Behavior: Behavior normal.        Thought Content:  Thought content normal.        Judgment: Judgment normal.      Assessment/Plan Obstructive sleep apnea and BMI 32.74  To OR for hypoglossal nerve stimulator placement.  Christia Reading,  MD 02/21/2023, 1:57 PM

## 2023-02-21 NOTE — Brief Op Note (Signed)
02/21/2023  4:09 PM  PATIENT:  Mark Strong  52 y.o. male  PRE-OPERATIVE DIAGNOSIS:  BMI 31.0-31.9, ADULT Obstructive Sleep Apnea  POST-OPERATIVE DIAGNOSIS:  BMI 31.0-31.9, ADULT Obstructive Sleep Apnea  PROCEDURE:  Procedure(s): IMPLANTATION OF HYPOGLOSSAL NERVE STIMULATOR (Right)  SURGEON:  Surgeons and Role:    Christia Reading, MD - Primary  PHYSICIAN ASSISTANT: Clovis Cao  ASSISTANTS: none   ANESTHESIA:   general  EBL:  Minimal   BLOOD ADMINISTERED:none  DRAINS: none   LOCAL MEDICATIONS USED:  LIDOCAINE   SPECIMEN:  No Specimen  DISPOSITION OF SPECIMEN:  N/A  COUNTS:  YES  TOURNIQUET:  * No tourniquets in log *  DICTATION: .Note written in EPIC  PLAN OF CARE: Discharge to home after PACU  PATIENT DISPOSITION:  PACU - hemodynamically stable.   Delay start of Pharmacological VTE agent (>24hrs) due to surgical blood loss or risk of bleeding: no

## 2023-02-21 NOTE — Anesthesia Procedure Notes (Signed)
Procedure Name: Intubation Date/Time: 02/21/2023 2:22 PM  Performed by: Shelton Silvas, MDPre-anesthesia Checklist: Patient identified, Patient being monitored, Timeout performed, Emergency Drugs available and Suction available Patient Re-evaluated:Patient Re-evaluated prior to induction Oxygen Delivery Method: Circle system utilized Preoxygenation: Pre-oxygenation with 100% oxygen Induction Type: IV induction Ventilation: Mask ventilation without difficulty Laryngoscope Size: Mac and 4 Grade View: Grade I Tube type: Oral Tube size: 7.5 mm Number of attempts: 1 Airway Equipment and Method: Stylet Placement Confirmation: ETT inserted through vocal cords under direct vision, positive ETCO2 and breath sounds checked- equal and bilateral Secured at: 22 cm Tube secured with: Tape Dental Injury: Teeth and Oropharynx as per pre-operative assessment

## 2023-02-21 NOTE — Op Note (Signed)

## 2023-02-21 NOTE — Transfer of Care (Signed)
Immediate Anesthesia Transfer of Care Note  Patient: Mark Strong  Procedure(s) Performed: IMPLANTATION OF HYPOGLOSSAL NERVE STIMULATOR (Right: Neck)  Patient Location: PACU  Anesthesia Type:General  Level of Consciousness: awake, alert , and oriented  Airway & Oxygen Therapy: Patient Spontanous Breathing  Post-op Assessment: Report given to RN  Post vital signs: stable  Last Vitals:  Vitals Value Taken Time  BP 129/76 02/21/23 1645  Temp 36.5 C 02/21/23 1631  Pulse 64 02/21/23 1649  Resp 15 02/21/23 1649  SpO2 94 % 02/21/23 1649  Vitals shown include unfiled device data.  Last Pain:  Vitals:   02/21/23 1631  TempSrc:   PainSc: 0-No pain         Complications: No notable events documented.

## 2023-02-22 ENCOUNTER — Encounter (HOSPITAL_COMMUNITY): Payer: Self-pay | Admitting: Otolaryngology

## 2023-02-26 DIAGNOSIS — G4733 Obstructive sleep apnea (adult) (pediatric): Secondary | ICD-10-CM | POA: Diagnosis not present

## 2023-02-26 DIAGNOSIS — M5416 Radiculopathy, lumbar region: Secondary | ICD-10-CM | POA: Diagnosis not present

## 2023-02-26 NOTE — Anesthesia Postprocedure Evaluation (Signed)
Anesthesia Post Note  Patient: Mark Strong  Procedure(s) Performed: IMPLANTATION OF HYPOGLOSSAL NERVE STIMULATOR (Right: Neck)     Patient location during evaluation: PACU Anesthesia Type: General Level of consciousness: awake and alert Pain management: pain level controlled Vital Signs Assessment: post-procedure vital signs reviewed and stable Respiratory status: spontaneous breathing, nonlabored ventilation, respiratory function stable and patient connected to nasal cannula oxygen Cardiovascular status: blood pressure returned to baseline and stable Postop Assessment: no apparent nausea or vomiting Anesthetic complications: no   No notable events documented.  Last Vitals:  Vitals:   02/21/23 1730 02/21/23 1745  BP: 133/83 138/82  Pulse: 62 63  Resp: 15 16  Temp:  36.5 C  SpO2: 95% 94%    Last Pain:  Vitals:   02/21/23 1745  TempSrc:   PainSc: 0-No pain                 Shelton Silvas

## 2023-02-28 ENCOUNTER — Encounter: Payer: Self-pay | Admitting: Emergency Medicine

## 2023-02-28 ENCOUNTER — Encounter: Payer: Self-pay | Admitting: Cardiovascular Disease

## 2023-02-28 NOTE — Telephone Encounter (Signed)
Thanks

## 2023-03-07 DIAGNOSIS — F900 Attention-deficit hyperactivity disorder, predominantly inattentive type: Secondary | ICD-10-CM | POA: Diagnosis not present

## 2023-03-07 DIAGNOSIS — F411 Generalized anxiety disorder: Secondary | ICD-10-CM | POA: Diagnosis not present

## 2023-03-07 DIAGNOSIS — F330 Major depressive disorder, recurrent, mild: Secondary | ICD-10-CM | POA: Diagnosis not present

## 2023-03-25 ENCOUNTER — Encounter: Payer: Medicare HMO | Admitting: Gastroenterology

## 2023-03-28 ENCOUNTER — Ambulatory Visit: Payer: Medicare HMO

## 2023-03-28 VITALS — BP 110/78 | HR 74 | Ht 71.0 in | Wt 261.0 lb

## 2023-03-28 DIAGNOSIS — Z23 Encounter for immunization: Secondary | ICD-10-CM

## 2023-03-28 DIAGNOSIS — F330 Major depressive disorder, recurrent, mild: Secondary | ICD-10-CM | POA: Diagnosis not present

## 2023-03-28 DIAGNOSIS — F411 Generalized anxiety disorder: Secondary | ICD-10-CM | POA: Diagnosis not present

## 2023-03-28 DIAGNOSIS — F900 Attention-deficit hyperactivity disorder, predominantly inattentive type: Secondary | ICD-10-CM | POA: Diagnosis not present

## 2023-03-28 DIAGNOSIS — Z0000 Encounter for general adult medical examination without abnormal findings: Secondary | ICD-10-CM

## 2023-03-28 NOTE — Patient Instructions (Addendum)
Mark Strong , Thank you for taking time to come for your Medicare Wellness Visit. I appreciate your ongoing commitment to your health goals. Please review the following plan we discussed and let me know if I can assist you in the future.   Referrals/Orders/Follow-Ups/Clinician Recommendations: You are due for a Covid vaccine.  You also are due for the PCV13 (Pneumonia vaccine), which you can get at your pharmacy.  It was nice speaking with you today.  Aim for 30 minutes of exercise or brisk walking, 6-8 glasses of water, and 5 servings of fruits and vegetables each day.   This is a list of the screening recommended for you and due dates:  Health Maintenance  Topic Date Due   HIV Screening  Never done   Hepatitis C Screening  Never done   Zoster (Shingles) Vaccine (1 of 2) Never done   Colon Cancer Screening  Never done   COVID-19 Vaccine (3 - Pfizer risk series) 12/19/2020   Medicare Annual Wellness Visit  03/27/2024   DTaP/Tdap/Td vaccine (2 - Td or Tdap) 01/20/2029   HPV Vaccine  Aged Out   Flu Shot  Discontinued    Advanced directives: (Copy Requested) Please bring a copy of your health care power of attorney and living will to the office to be added to your chart at your convenience.  Next Medicare Annual Wellness Visit scheduled for next year: Yes

## 2023-03-28 NOTE — Progress Notes (Signed)
Subjective:   Mark Strong is a 52 y.o. male who presents for an Initial Medicare Annual Wellness Visit.  Visit Complete: In person   Cardiac Risk Factors include: advanced age (>41men, >6 women);male gender;Other (see comment), Risk factor comments: A-Fib, OSA     Objective:    Today's Vitals   03/28/23 1429  BP: 110/78  Pulse: 74  SpO2: 98%  Weight: 261 lb (118.4 kg)  Height: 5\' 11"  (1.803 m)   Body mass index is 36.4 kg/m.     03/28/2023    3:23 PM 02/21/2023   11:26 AM 02/05/2023    3:26 PM 11/20/2022   11:29 AM 09/04/2022    7:55 AM 12/29/2019    1:53 PM 03/13/2019    1:43 PM  Advanced Directives  Does Patient Have a Medical Advance Directive? No No No No No No No  Would patient like information on creating a medical advance directive?  No - Patient declined No - Patient declined No - Patient declined No - Patient declined  No - Patient declined    Current Medications (verified) Outpatient Encounter Medications as of 03/28/2023  Medication Sig   albuterol (VENTOLIN HFA) 108 (90 Base) MCG/ACT inhaler Inhale 2 puffs into the lungs every 6 (six) hours as needed for wheezing or shortness of breath.   aspirin EC 81 MG tablet Take 81 mg by mouth once.   atomoxetine (STRATTERA) 40 MG capsule Take 40 mg by mouth 2 (two) times daily with a meal.   atorvastatin (LIPITOR) 40 MG tablet Take 1 tablet (40 mg total) by mouth daily.   buPROPion (WELLBUTRIN XL) 300 MG 24 hr tablet Take 300 mg by mouth daily.   cholecalciferol (VITAMIN D3) 25 MCG (1000 UNIT) tablet Take 1,000 Units by mouth daily.   cyanocobalamin (VITAMIN B12) 1000 MCG tablet Take 1,000 mcg by mouth daily.   docusate sodium (COLACE) 100 MG capsule Take 100 mg by mouth daily as needed (Constipation).   DULoxetine (CYMBALTA) 60 MG capsule Take 60 mg by mouth 2 (two) times daily.   ezetimibe (ZETIA) 10 MG tablet Take 1 tablet (10 mg total) by mouth daily.   finasteride (PROSCAR) 5 MG tablet Take 1 tablet (5 mg  total) by mouth daily.   ibuprofen (ADVIL) 800 MG tablet Take 800 mg by mouth every 8 (eight) hours as needed for mild pain or moderate pain.   levothyroxine (SYNTHROID) 75 MCG tablet Take 1 tablet (75 mcg total) by mouth every morning.   sildenafil (VIAGRA) 25 MG tablet Take 25 mg by mouth as needed for erectile dysfunction.   traZODone (DESYREL) 50 MG tablet Take 25 mg by mouth at bedtime.   BD DISP NEEDLES 25G X 5/8" MISC    Blood Pressure Monitoring (BLOOD PRESSURE CUFF) MISC 1 Device by Does not apply route once for 1 dose.   HYDROcodone-acetaminophen (NORCO/VICODIN) 5-325 MG tablet Take 1-2 tablets by mouth every 6 (six) hours as needed for moderate pain. (Patient not taking: Reported on 03/28/2023)   HYDROcodone-acetaminophen (NORCO/VICODIN) 5-325 MG tablet Take 1-2 tablets by mouth every 6 (six) hours as needed for moderate pain. (Patient not taking: Reported on 03/28/2023)   No facility-administered encounter medications on file as of 03/28/2023.    Allergies (verified) Betadine [povidone iodine]   History: Past Medical History:  Diagnosis Date   Allergy Dec1,2017   Anxiety    Arthritis 2018   Depression    Dysrhythmia    Heart murmur 2012   Memory loss  Pacemaker    PVNS (pigmented villonodular synovitis)    Sciatica    Sleep apnea    Thyroid disease    Varicose veins of right leg with edema    Past Surgical History:  Procedure Laterality Date   CARDIAC CATHETERIZATION     left and right    DRUG INDUCED ENDOSCOPY Bilateral 11/20/2022   Procedure: DRUG INDUCED SLEEP  ENDOSCOPY;  Surgeon: Christia Reading, MD;  Location: [facility];  Service: ENT;  Laterality: Bilateral;   IMPLANTATION OF HYPOGLOSSAL NERVE STIMULATOR Right 02/21/2023   Procedure: IMPLANTATION OF HYPOGLOSSAL NERVE STIMULATOR;  Surgeon: Christia Reading, MD;  Location: [facility] OR;  Service: ENT;  Laterality: Right;   INSERT / REPLACE / REMOVE PACEMAKER     Biotronik, dual-chamber, 05/18/2016 for SSS    Right greater saphenous vein ablation Right    Family History  Problem Relation Age of Onset   Arthritis Mother    Depression Mother    Heart disease Mother    Obesity Mother    Arthritis Father    COPD Father    Diabetes Father    Hyperlipidemia Father    Hypertension Father    Kidney disease Father    Thyroid disease Father    Cancer Brother    Social History   Socioeconomic History   Marital status: Single    Spouse name: Not on file   Number of children: 0   Years of education: 12   Highest education level: 12th grade  Occupational History   Occupation: Disabled  Tobacco Use   Smoking status: Never    Passive exposure: Past   Smokeless tobacco: Never  Vaping Use   Vaping status: Never Used  Substance and Sexual Activity   Alcohol use: Never   Drug use: Never   Sexual activity: Not Currently    Birth control/protection: Condom  Other Topics Concern   Not on file  Social History Narrative   Right handed   Sugar free    Mom lives with him.   Disabled    One floor home   Social Determinants of Health   Financial Resource Strain: Low Risk  (03/28/2023)   Overall Financial Resource Strain (CARDIA)    Difficulty of Paying Living Expenses: Not very hard  Food Insecurity: No Food Insecurity (03/28/2023)   Hunger Vital Sign    Worried About Running Out of Food in the Last Year: Never true    Ran Out of Food in the Last Year: Never true  Transportation Needs: No Transportation Needs (03/28/2023)   PRAPARE - Administrator, Civil Service (Medical): No    Lack of Transportation (Non-Medical): No  Physical Activity: Insufficiently Active (03/28/2023)   Exercise Vital Sign    Days of Exercise per Week: 3 days    Minutes of Exercise per Session: 30 min  Stress: No Stress Concern Present (03/28/2023)   [employer] of Occupational Health - Occupational Stress Questionnaire    Feeling of Stress : Not at all  Recent Concern: Stress - Stress  Concern Present (02/12/2023)   [employer] of Occupational Health - Occupational Stress Questionnaire    Feeling of Stress : To some extent  Social Connections: Moderately Isolated (03/28/2023)   Social Connection and Isolation Panel [NHANES]    Frequency of Communication with Friends and Family: Never    Frequency of Social Gatherings with Friends and Family: More than three times a week    Attends Religious Services: 1 to 4 times  per year    Active Member of Clubs or Organizations: No    Attends Banker Meetings: Never    Marital Status: Never married    Tobacco Counseling Counseling given: Not Answered   Clinical Intake:  Pre-visit preparation completed: Yes  Pain : No/denies pain     BMI - recorded: 36.4 Nutritional Status: BMI > 30  Obese Nutritional Risks: None Diabetes: No  How often do you need to have someone help you when you read instructions, pamphlets, or other written materials from your doctor or pharmacy?: 1 - Never  Interpreter Needed?: No  Information entered by :: Narvel Kozub, RMA   Activities of Daily Living    03/28/2023    2:33 PM 02/21/2023   11:25 AM  In your present state of health, do you have any difficulty performing the following activities:  Hearing? 0 0  Vision? 0 0  Difficulty concentrating or making decisions? 1 0  Comment ADD   Walking or climbing stairs? 0 0  Dressing or bathing? 0 0  Doing errands, shopping? 1   Comment Psychiatric nurse and eating ? N   Using the Toilet? N   In the past six months, have you accidently leaked urine? N   Do you have problems with loss of bowel control? N   Managing your Medications? N   Managing your Finances? N   Housekeeping or managing your Housekeeping? N     Patient Care Team: Georgina Quint, MD as PCP - General (Internal Medicine) Karel Jarvis Lesle [name], MD as Consulting Physician (Neurology)  Indicate any recent Medical Services  you may have received from other than [facility] providers in the past year (date may be approximate).     Assessment:   This is a routine wellness examination for [place].  Hearing/Vision screen Hearing Screening - Comments:: Denies hearing difficulties   Vision Screening - Comments:: Wears eyeglasses   Goals Addressed               This Visit's Progress     Patient Stated (pt-stated)        Would like to lose some weight.      Depression Screen    03/28/2023    2:48 PM 02/12/2023   10:20 AM 12/19/2022    9:09 AM 01/08/2020   10:47 AM 08/27/2019   11:34 AM 07/30/2019   11:50 AM 04/27/2019    3:07 PM  PHQ 2/9 Scores  PHQ - 2 Score 0 2 0 0 0 0 0  PHQ- 9 Score 1 5 0        Fall Risk    03/28/2023    2:42 PM 02/12/2023   10:20 AM 02/05/2023    3:26 PM 12/19/2022    9:09 AM 09/04/2022    7:55 AM  Fall Risk   Falls in the past year? 0 1 0 0 0  Number falls in past yr: 0 0 0 0 0  Injury with Fall? 0 0 0 0 0  Risk for fall due to : No Fall Risks No Fall Risks  No Fall Risks   Follow up Falls prevention discussed;Falls evaluation completed Falls evaluation completed Falls evaluation completed Falls evaluation completed Falls evaluation completed    MEDICARE RISK AT HOME: Medicare Risk at Home Any stairs in or around the home?: Yes (going into the house and in the back) If so, are there any without handrails?: Yes Home free of loose throw rugs  in walkways, pet beds, electrical cords, etc?: Yes Adequate lighting in your home to reduce risk of falls?: Yes Life alert?: No Use of a cane, walker or w/c?: Yes Grab bars in the bathroom?: Yes Shower chair or bench in shower?: Yes Elevated toilet seat or a handicapped toilet?: Yes  TIMED UP AND GO:  Was the test performed? Yes  Length of time to ambulate 10 feet: 10 sec Gait steady and fast with assistive device    Cognitive Function:      09/04/2022    9:00 AM 09/01/2019    9:00 AM  Montreal Cognitive Assessment    Visuospatial/ Executive (0/5) 4 3  Naming (0/3) 3 3  Attention: Read list of digits (0/2) 2 2  Attention: Read list of letters (0/1) 1 1  Attention: Serial 7 subtraction starting at 100 (0/3) 3 3  Language: Repeat phrase (0/2) 2 2  Language : Fluency (0/1) 1 1  Abstraction (0/2) 1 2  Delayed Recall (0/5) 4 5  Orientation (0/6) 6 6  Total 27 28  Adjusted Score (based on education) 28 29      03/28/2023    2:44 PM  6CIT Screen  What Year? 0 points  What time? 0 points  Count back from 20 0 points  Months in reverse 0 points  Repeat phrase 2 points    Immunizations Immunization History  Administered Date(s) Administered   PFIZER Comirnaty(Gray Top)Covid-19 Tri-Sucrose Vaccine 11/21/2020   PFIZER(Purple Top)SARS-COV-2 Vaccination 03/21/2020   Tdap 01/21/2019    TDAP status: Up to date  Flu Vaccine status: Completed at today's visit  Pneumococcal vaccine status: Due, Education has been provided regarding the importance of this vaccine. Advised may receive this vaccine at local pharmacy or Health Dept. Aware to provide a copy of the vaccination record if obtained from local pharmacy or Health Dept. Verbalized acceptance and understanding.  Covid-19 vaccine status: Information provided on how to obtain vaccines.   Qualifies for Shingles Vaccine? Yes   Zostavax completed No   Shingrix Completed?: No.    Education has been provided regarding the importance of this vaccine. Patient has been advised to call insurance company to determine out of pocket expense if they have not yet received this vaccine. Advised may also receive vaccine at local pharmacy or Health Dept. Verbalized acceptance and understanding.  Screening Tests Health Maintenance  Topic Date Due   HIV Screening  Never done   Hepatitis C Screening  Never done   Zoster Vaccines- Shingrix (1 of 2) Never done   Colonoscopy  Never done   COVID-19 Vaccine (3 - Pfizer risk series) 12/19/2020   Medicare Annual  Wellness (AWV)  03/27/2024   DTaP/Tdap/Td (2 - Td or Tdap) 01/20/2029   HPV VACCINES  Aged Out   INFLUENZA VACCINE  Discontinued    Health Maintenance  Health Maintenance Due  Topic Date Due   HIV Screening  Never done   Hepatitis C Screening  Never done   Zoster Vaccines- Shingrix (1 of 2) Never done   Colonoscopy  Never done   COVID-19 Vaccine (3 - Pfizer risk series) 12/19/2020    Colorectal cancer screening: Referral to GI placed 12/19/2022. Pt aware the office will call re: appt.  Lung Cancer Screening: (Low Dose CT Chest recommended if Age 55-80 years, 20 pack-year currently smoking OR have quit w/in 15years.) does not qualify.   Lung Cancer Screening Referral: N/A  Additional Screening:  Hepatitis C Screening: does not qualify;   Vision Screening:  Recommended annual ophthalmology exams for early detection of glaucoma and other disorders of the eye. Is the patient up to date with their annual eye exam?  No  Who is the provider or what is the name of the office in which the patient attends annual eye exams? N/A If pt is not established with a provider, would they like to be referred to a provider to establish care? Yes .   Dental Screening: Recommended annual dental exams for proper oral hygiene    Community Resource Referral / Chronic Care Management: CRR required this visit?  No   CCM required this visit?  No    Plan:     I have personally reviewed and noted the following in the patient's chart:   Medical and social history Use of alcohol, tobacco or illicit drugs  Current medications and supplements including opioid prescriptions. Patient is not currently taking opioid prescriptions. Functional ability and status Nutritional status Physical activity Advanced directives List of other physicians Hospitalizations, surgeries, and ER visits in previous 12 months Vitals Screenings to include cognitive, depression, and falls Referrals and appointments  In  addition, I have reviewed and discussed with patient certain preventive protocols, quality metrics, and best practice recommendations. A written personalized care plan for preventive services as well as general preventive health recommendations were provided to patient.     Christiano Blandon L Hance Caspers, CMA   03/28/2023   After Visit Summary: (MyChart) Due to this being a telephonic visit, the after visit summary with patients personalized plan was offered to patient via MyChart   Nurse Notes: Patient is due for a Covid vaccine.  Patient would like PCP to recommend a doctor to establish eye care with.  Patient also would like to discuss his weight during next office visit.  He had no other concerns to address today.

## 2023-03-31 ENCOUNTER — Encounter: Payer: Self-pay | Admitting: Cardiovascular Disease

## 2023-04-05 ENCOUNTER — Encounter: Payer: Self-pay | Admitting: Cardiovascular Disease

## 2023-04-05 MED ORDER — ATORVASTATIN CALCIUM 40 MG PO TABS: 40.0000 mg | ORAL_TABLET | Freq: Every day | ORAL | 3 refills | Status: DC

## 2023-04-08 ENCOUNTER — Ambulatory Visit: Payer: Medicare HMO | Admitting: Adult Health

## 2023-04-08 ENCOUNTER — Encounter: Payer: Self-pay | Admitting: Adult Health

## 2023-04-08 VITALS — BP 128/84 | HR 74 | Ht 74.0 in | Wt 264.0 lb

## 2023-04-08 DIAGNOSIS — R0602 Shortness of breath: Secondary | ICD-10-CM | POA: Diagnosis not present

## 2023-04-08 DIAGNOSIS — G4733 Obstructive sleep apnea (adult) (pediatric): Secondary | ICD-10-CM

## 2023-04-08 DIAGNOSIS — R0600 Dyspnea, unspecified: Secondary | ICD-10-CM | POA: Insufficient documentation

## 2023-04-08 NOTE — Progress Notes (Signed)
@Patient  ID: Mark Strong, male    DOB: [birth year], 52 y.o.   MRN: [id number]  No chief complaint on file.   Referring provider: Georgina Quint, *  HPI: 52 yo  male seen for pulmonary consult September 05, 2022 for shortness of breath and sleep apnea  TEST/EVENTS :   04/08/2023 Follow up : OSA  Patient returns for a follow-up visit.  Patient was seen September 05, 2022 for consult.  Patient had sleep apnea but was intolerant to CPAP or oral appliance. Was referred to ENT for evaluation for INSPIRE . Repeat home sleep study on September 13, 2022 showed moderate sleep apnea with AHI at 44.8/hour.  He underwent drug-induced sleep endoscopy on November 20, 2022 that showed 100% anterior posterior collapse at the vellum-consistent with a good hypoglossal nerve stimulator candidate..  Patient was set up for implantation which was done on February 21, 2023. Patient says he did very well with surgery.  Has had no difficulties over the last 6 weeks.  Has had his postop check with ENT.  Has had no redness or drainage at surgical site. He is here today for activation of his inspire device.  The inspire team is here for activation. Sensation level was 0.2 V, functional level 0.2 V. Start delay 30 minutes, pause time 15 minutes and 8-hour duration. Functional level 0.2 to 1.2 V.  Waveform was normal Remote control set up and patient education with return demonstration was completed  Patient was seen in March for an evaluation of shortness of breath.  He has been set up for PFTs that were normal.  Patient says the shortness of breath he was previously having has gotten better.  Currently is not having any dyspnea.  Denies any cough or wheezing.  He is a never smoker.  Chest x-ray February 21, 2023 showed clear lungs.  Patient is followed by cardiology has underlying coronary artery disease, history of sinus node dysfunction status post dual-chamber permanent pacemaker in 2017.       Allergies   Allergen Reactions   Betadine [Povidone Iodine] Rash    Immunization History  Administered Date(s) Administered   Influenza, Seasonal, Injecte, Preservative Fre 03/28/2023   PFIZER Comirnaty(Gray Top)Covid-19 Tri-Sucrose Vaccine 11/21/2020   PFIZER(Purple Top)SARS-COV-2 Vaccination 03/21/2020   Tdap 01/21/2019   Zoster Recombinant(Shingrix) 02/05/2022, 11/01/2022    Past Medical History:  Diagnosis Date   Allergy Dec1,2017   Anxiety    Arthritis 2018   Depression    Dysrhythmia    Heart murmur 2012   Memory loss    Pacemaker    PVNS (pigmented villonodular synovitis)    Sciatica    Sleep apnea    Thyroid disease    Varicose veins of right leg with edema     Tobacco History: Social History   Tobacco Use  Smoking Status Never   Passive exposure: Past  Smokeless Tobacco Never   Counseling given: Not Answered   Outpatient Medications Prior to Visit  Medication Sig Dispense Refill   albuterol (VENTOLIN HFA) 108 (90 Base) MCG/ACT inhaler Inhale 2 puffs into the lungs every 6 (six) hours as needed for wheezing or shortness of breath. 8 g 6   aspirin EC 81 MG tablet Take 81 mg by mouth once.     atomoxetine (STRATTERA) 40 MG capsule Take 40 mg by mouth 2 (two) times daily with a meal.     atorvastatin (LIPITOR) 40 MG tablet Take 1 tablet (40 mg total) by mouth daily. 90 tablet 3  BD DISP NEEDLES 25G X 5/8" MISC      buPROPion (WELLBUTRIN XL) 300 MG 24 hr tablet Take 300 mg by mouth daily.     busPIRone (BUSPAR) 5 MG tablet Take 5 mg by mouth 2 (two) times daily.     cholecalciferol (VITAMIN D3) 25 MCG (1000 UNIT) tablet Take 1,000 Units by mouth daily.     cyanocobalamin (VITAMIN B12) 1000 MCG tablet Take 1,000 mcg by mouth daily.     docusate sodium (COLACE) 100 MG capsule Take 100 mg by mouth daily as needed (Constipation).     DULoxetine (CYMBALTA) 60 MG capsule Take 60 mg by mouth 2 (two) times daily.     ezetimibe (ZETIA) 10 MG tablet Take 1 tablet (10 mg total)  by mouth daily. 90 tablet 3   finasteride (PROSCAR) 5 MG tablet Take 1 tablet (5 mg total) by mouth daily. 90 tablet 3   ibuprofen (ADVIL) 800 MG tablet Take 800 mg by mouth every 8 (eight) hours as needed for mild pain or moderate pain.     levothyroxine (SYNTHROID) 75 MCG tablet Take 1 tablet (75 mcg total) by mouth every morning. 90 tablet 3   sildenafil (VIAGRA) 25 MG tablet Take 25 mg by mouth as needed for erectile dysfunction.     traZODone (DESYREL) 50 MG tablet Take 25 mg by mouth at bedtime.     Blood Pressure Monitoring (BLOOD PRESSURE CUFF) MISC 1 Device by Does not apply route once for 1 dose. 1 each 0   HYDROcodone-acetaminophen (NORCO/VICODIN) 5-325 MG tablet Take 1-2 tablets by mouth every 6 (six) hours as needed for moderate pain. (Patient not taking: Reported on 03/28/2023) 12 tablet 0   HYDROcodone-acetaminophen (NORCO/VICODIN) 5-325 MG tablet Take 1-2 tablets by mouth every 6 (six) hours as needed for moderate pain. (Patient not taking: Reported on 03/28/2023) 12 tablet 0   No facility-administered medications prior to visit.     Review of Systems:   Constitutional:   No  weight loss, night sweats,  Fevers, chills, fatigue, or  lassitude.  HEENT:   No headaches,  Difficulty swallowing,  Tooth/dental problems, or  Sore throat,                No sneezing, itching, ear ache, nasal congestion, post nasal drip,   CV:  No chest pain,  Orthopnea, PND, swelling in lower extremities, anasarca, dizziness, palpitations, syncope.   GI  No heartburn, indigestion, abdominal pain, nausea, vomiting, diarrhea, change in bowel habits, loss of appetite, bloody stools.   Resp: No shortness of breath with exertion or at rest.  No excess mucus, no productive cough,  No non-productive cough,  No coughing up of blood.  No change in color of mucus.  No wheezing.  No chest wall deformity  Skin: no rash or lesions.  GU: no dysuria, change in color of urine, no urgency or frequency.  No flank  pain, no hematuria   MS:  No joint pain or swelling.  No decreased range of motion.  No back pain.    Physical Exam  BP 128/84 (BP Location: Left Arm, Patient Position: Sitting, Cuff Size: Normal)   Pulse 74   Ht 6\' 2"  (1.88 m)   Wt 264 lb (119.7 kg)   SpO2 99%   BMI 33.90 kg/m   GEN: A/Ox3; pleasant , NAD, well nourished    HEENT:  [place]/AT,   NOSE-clear, THROAT-clear, no lesions, no postnasal drip or exudate noted.   NECK:  Supple w/  fair ROM; no JVD; normal carotid impulses w/o bruits; no thyromegaly or nodules palpated; no lymphadenopathy.    RESP  Clear  P & A; w/o, wheezes/ rales/ or rhonchi. no accessory muscle use, no dullness to percussion  CARD:  RRR, no m/r/g, no peripheral edema, pulses intact, no cyanosis or clubbing.  GI:   Soft & nt; nml bowel sounds; no organomegaly or masses detected.   Musco: Warm bil, no deformities or joint swelling noted.   Neuro: alert, no focal deficits noted.    Skin: Warm, no lesions or rashes, right anterior chest wall incision/ and submandibular incision, healing well with no redness.     Lab Results:  CBC    Component Value Date/Time   WBC 6.0 02/21/2023 1108   RBC 4.92 02/21/2023 1108   HGB 14.7 02/21/2023 1108   HGB 15.9 06/28/2022 0955   HCT 43.1 02/21/2023 1108   HCT 46.2 06/28/2022 0955   PLT 214 02/21/2023 1108   PLT 254 06/28/2022 0955   MCV 87.6 02/21/2023 1108   MCV 84 06/28/2022 0955   MCH 29.9 02/21/2023 1108   MCHC 34.1 02/21/2023 1108   RDW 13.5 02/21/2023 1108   RDW 13.5 06/28/2022 0955    BMET    Component Value Date/Time   NA 139 02/21/2023 1108   NA 144 01/03/2023 1242   K 3.8 02/21/2023 1108   CL 104 02/21/2023 1108   CO2 26 02/21/2023 1108   GLUCOSE 84 02/21/2023 1108   BUN 14 02/21/2023 1108   BUN 14 01/03/2023 1242   CREATININE 1.18 02/21/2023 1108   CALCIUM 9.0 02/21/2023 1108   GFRNONAA >60 02/21/2023 1108   GFRAA 98 03/02/2019 1126    BNP No results found for: "BNP"  ProBNP     Component Value Date/Time   PROBNP 13.0 09/05/2022 1028    Imaging: No results found.  Administration History     None          Latest Ref Rng & Units 09/12/2022   10:52 AM  PFT Results  FVC-Pre L 5.60   FVC-Predicted Pre % 97   FVC-Post L 5.91   FVC-Predicted Post % 103   Pre FEV1/FVC % % 82   Post FEV1/FCV % % 82   FEV1-Pre L 4.60   FEV1-Predicted Pre % 103   FEV1-Post L 4.87   DLCO uncorrected ml/min/mmHg 35.72   DLCO UNC% % 108   DLCO corrected ml/min/mmHg 35.72   DLCO COR %Predicted % 108   DLVA Predicted % 108   TLC L 7.92   TLC % Predicted % 101   RV % Predicted % 96     No results found for: "NITRICOXIDE"      Assessment & Plan:   OSA (obstructive sleep apnea) Moderate obstructive sleep apnea with CPAP and oral appliance intolerance.  Patient is status post inspire implantation February 21, 2023. He has done well post implant.  Activation today with sensation at 0.2 V and functional level at 0.2 V.  Functional level will be set at 0.2 to 1.2 V.  Patient education given on inspire and titration.  He will go up by 0.1 V weekly.  Will be back in office in 4 to 6 weeks.  He is to titrate as tolerated.  Remote control was contacted with patient education and return demonstration done today in office. Current settings at 30 minutes start delay, 15-minute pause and 8-hour duration  Plan  Patient Instructions  Begin INSPIRE titration as discussed  Go up by 1 level each week as comfortable .  Follow up in 4 -6 weeks with Dr. Wynona Neat or Overton Boggus NP  and As needed   Call for any issues or questions.     Dyspnea Previous shortness of breath seen in March 2024.  PFTs were completed and normal.  Recent chest x-ray was clear.  Patient has no current symptoms of dyspnea.  If symptoms return will continue with ongoing evaluation.  PFTs were reviewed in detail.   I spent 40    minutes dedicated to the care of this patient on the date of this encounter to include  pre-visit review of records, face-to-face time with the patient discussing conditions above, post visit ordering of testing, clinical documentation with the electronic health record, making appropriate referrals as documented, and communicating necessary findings to members of the patients care team.     Rubye Oaks, NP 04/08/2023

## 2023-04-08 NOTE — Assessment & Plan Note (Signed)
Moderate obstructive sleep apnea with CPAP and oral appliance intolerance.  Patient is status post inspire implantation February 21, 2023. He has done well post implant.  Activation today with sensation at 0.2 V and functional level at 0.2 V.  Functional level will be set at 0.2 to 1.2 V.  Patient education given on inspire and titration.  He will go up by 0.1 V weekly.  Will be back in office in 4 to 6 weeks.  He is to titrate as tolerated.  Remote control was contacted with patient education and return demonstration done today in office. Current settings at 30 minutes start delay, 15-minute pause and 8-hour duration  Plan  Patient Instructions  Begin INSPIRE titration as discussed  Go up by 1 level each week as comfortable .  Follow up in 4 -6 weeks with Dr. Wynona Neat or Shaquel Chavous NP  and As needed   Call for any issues or questions.

## 2023-04-08 NOTE — Assessment & Plan Note (Signed)
Previous shortness of breath seen in March 2024.  PFTs were completed and normal.  Recent chest x-ray was clear.  Patient has no current symptoms of dyspnea.  If symptoms return will continue with ongoing evaluation.  PFTs were reviewed in detail.

## 2023-04-08 NOTE — Patient Instructions (Signed)
Begin INSPIRE titration as discussed  Go up by 1 level each week as comfortable .  Follow up in 4 -6 weeks with Dr. Wynona Neat or Jaleiah Asay NP  and As needed   Call for any issues or questions.

## 2023-04-09 ENCOUNTER — Ambulatory Visit (INDEPENDENT_AMBULATORY_CARE_PROVIDER_SITE_OTHER): Payer: Medicare HMO

## 2023-04-09 DIAGNOSIS — I495 Sick sinus syndrome: Secondary | ICD-10-CM

## 2023-04-11 LAB — CUP PACEART REMOTE DEVICE CHECK
Battery Remaining Percentage: 50 %
Brady Statistic RA Percent Paced: 91 %
Brady Statistic RV Percent Paced: 0 %
Date Time Interrogation Session: 20241022071720
Implantable Lead Connection Status: 753985
Implantable Lead Connection Status: 753985
Implantable Lead Implant Date: 20171217
Implantable Lead Implant Date: 20171217
Implantable Lead Location: 753859
Implantable Lead Location: 753860
Implantable Lead Model: 377
Implantable Lead Model: 377
Implantable Lead Serial Number: 49336100
Implantable Lead Serial Number: 49685190
Implantable Pulse Generator Implant Date: 20171217
Lead Channel Impedance Value: 507 Ohm
Lead Channel Impedance Value: 507 Ohm
Lead Channel Impedance Value: 585 Ohm
Lead Channel Impedance Value: 585 Ohm
Lead Channel Pacing Threshold Amplitude: 0.6 V
Lead Channel Pacing Threshold Amplitude: 1.1 V
Lead Channel Pacing Threshold Pulse Width: 0.4 ms
Lead Channel Pacing Threshold Pulse Width: 0.4 ms
Lead Channel Sensing Intrinsic Amplitude: 1.8 mV
Lead Channel Sensing Intrinsic Amplitude: 10.6 mV
Lead Channel Setting Pacing Amplitude: 1.2 V
Lead Channel Setting Pacing Amplitude: 1.6 V
Lead Channel Setting Pacing Pulse Width: 0.4 ms
Pulse Gen Model: 394929
Pulse Gen Serial Number: 68775417

## 2023-04-18 DIAGNOSIS — F900 Attention-deficit hyperactivity disorder, predominantly inattentive type: Secondary | ICD-10-CM | POA: Diagnosis not present

## 2023-04-18 DIAGNOSIS — F411 Generalized anxiety disorder: Secondary | ICD-10-CM | POA: Diagnosis not present

## 2023-04-18 DIAGNOSIS — F330 Major depressive disorder, recurrent, mild: Secondary | ICD-10-CM | POA: Diagnosis not present

## 2023-04-29 DIAGNOSIS — M5416 Radiculopathy, lumbar region: Secondary | ICD-10-CM | POA: Diagnosis not present

## 2023-04-29 NOTE — Progress Notes (Signed)
Remote pacemaker transmission.   

## 2023-05-02 NOTE — Telephone Encounter (Signed)
Blood pressure is running a little too high.  Heart rate is fine.  Please have him start taking amlodipine 2.5 mg once daily, send him in a prescription.  Remind him that he took this in the past for coronary spasm and he seemed to tolerate it well.  Send [place] another log of his blood pressure in about 10-14 days.

## 2023-05-03 MED ORDER — AMLODIPINE BESYLATE 2.5 MG PO TABS
2.5000 mg | ORAL_TABLET | Freq: Every day | ORAL | 3 refills | Status: DC
Start: 2023-05-03 — End: 2023-05-22

## 2023-05-03 NOTE — Addendum Note (Signed)
Addended by: Lindell Spar on: 05/03/2023 02:26 PM   Modules accepted: Orders

## 2023-05-06 ENCOUNTER — Ambulatory Visit: Payer: Medicare HMO | Admitting: Adult Health

## 2023-05-06 ENCOUNTER — Encounter: Payer: Self-pay | Admitting: Adult Health

## 2023-05-06 VITALS — BP 122/90 | HR 77 | Ht 74.0 in | Wt 266.8 lb

## 2023-05-06 DIAGNOSIS — G4733 Obstructive sleep apnea (adult) (pediatric): Secondary | ICD-10-CM

## 2023-05-06 NOTE — Progress Notes (Signed)
@Patient  ID: Mark Strong, male    DOB: 1970/08/16, 52 y.o.   MRN: [id number]  Chief Complaint  Patient presents with   Follow-up    Referring provider: Georgina Quint, * Discussed the use of AI scribe software for clinical note transcription with the patient, who gave verbal consent to proceed.  HPI: 52 year old male seen for pulmonary and sleep consult September 05, 2022 for shortness of breath (resolved on f//up with normal chest xray and normal PFT)  and sleep apnea status post inspire device Medical history significant for coronary artery disease, sinus node dysfunction status post pacemaker 2017  TEST/EVENTS :  home sleep study on September 13, 2022 showed moderate sleep apnea with AHI at 44.8/hour.   DISE November 20, 2022 100% anterior posterior collapse Hypoglossal nerve stimulator implantation February 21, 2023 April 08, 2023 inspire activation -Sensation level was 0.2 V, functional level 0.2 V. Start delay 30 minutes, pause time 15 minutes and 8-hour duration. Functional level 0.2 to 1.2 V.    05/06/2023 Follow up : OSA Patient returns for 1 month follow-up.  Patient has severe obstructive sleep apnea.  He was CPAP intolerant.  He underwent inspire implantation February 21, 2023.  Last visit patient underwent inspire activation in office. Sensation level was 0.2 V, functional level 0.2 V. Start delay 30 minutes, pause time 15 minutes and 8-hour duration. Functional level 0.2 to 1.2 V.   Since the activation of the Inspire device, the patient reported significant improvement in symptoms. The patient has been gradually increasing the device settings as instructed, currently at the level 5 and reported no discomfort. The patient's snoring has completely resolved, and he feels more rested. The patient also noted an improvement in previously experienced shortness of breath. No issues with swallowing or tongue problems were reported.  The patient has been compliant with  the use of the Inspire device, using it every night for an average of seven and a half hours. The patient has been tolerating the device well and reported no adverse effects.   Inspire compliance report shows 100% compliance.  Daily average usage at 7.5 hours.  Current voltage is at 0.6 V.  Stimulation test today in the office shows normal tongue movement and no significant discomfort.     Initial consult patient was seen in March for evaluation of shortness of breath.  He was set up for pulmonary function testing that was normal.  Last visit patient said his shortness of breath had resolved.  He is a never smoker.  Chest x-ray September 2024 showed clear lungs.  Today he says his breathing is doing well.  Allergies  Allergen Reactions   Betadine [Povidone Iodine] Rash    Immunization History  Administered Date(s) Administered   Influenza, Seasonal, Injecte, Preservative Fre 03/28/2023   PFIZER Comirnaty(Gray Top)Covid-19 Tri-Sucrose Vaccine 11/21/2020   PFIZER(Purple Top)SARS-COV-2 Vaccination 03/21/2020   Tdap 01/21/2019   Zoster Recombinant(Shingrix) 02/05/2022, 11/01/2022    Past Medical History:  Diagnosis Date   Allergy Dec1,2017   Anxiety    Arthritis 2018   Depression    Dysrhythmia    Heart murmur 2012   Memory loss    Pacemaker    PVNS (pigmented villonodular synovitis)    Sciatica    Sleep apnea    Thyroid disease    Varicose veins of right leg with edema     Tobacco History: Social History   Tobacco Use  Smoking Status Never   Passive exposure: Past  Smokeless Tobacco Never  Counseling given: Not Answered   Outpatient Medications Prior to Visit  Medication Sig Dispense Refill   albuterol (VENTOLIN HFA) 108 (90 Base) MCG/ACT inhaler Inhale 2 puffs into the lungs every 6 (six) hours as needed for wheezing or shortness of breath. 8 g 6   amLODipine (NORVASC) 2.5 MG tablet Take 1 tablet (2.5 mg total) by mouth daily. 90 tablet 3    amphetamine-dextroamphetamine (ADDERALL XR) 10 MG 24 hr capsule Take 10 mg by mouth every morning.     aspirin EC 81 MG tablet Take 81 mg by mouth once.     atorvastatin (LIPITOR) 40 MG tablet Take 1 tablet (40 mg total) by mouth daily. 90 tablet 3   BD DISP NEEDLES 25G X 5/8" MISC      buPROPion (WELLBUTRIN XL) 300 MG 24 hr tablet Take 300 mg by mouth daily.     busPIRone (BUSPAR) 5 MG tablet Take 5 mg by mouth 2 (two) times daily.     cholecalciferol (VITAMIN D3) 25 MCG (1000 UNIT) tablet Take 1,000 Units by mouth daily.     cyanocobalamin (VITAMIN B12) 1000 MCG tablet Take 1,000 mcg by mouth daily.     docusate sodium (COLACE) 100 MG capsule Take 100 mg by mouth daily as needed (Constipation).     DULoxetine (CYMBALTA) 60 MG capsule Take 60 mg by mouth 2 (two) times daily.     finasteride (PROSCAR) 5 MG tablet Take 1 tablet (5 mg total) by mouth daily. 90 tablet 3   ibuprofen (ADVIL) 800 MG tablet Take 800 mg by mouth every 8 (eight) hours as needed for mild pain or moderate pain.     levothyroxine (SYNTHROID) 75 MCG tablet Take 1 tablet (75 mcg total) by mouth every morning. 90 tablet 3   sildenafil (VIAGRA) 25 MG tablet Take 25 mg by mouth as needed for erectile dysfunction.     traZODone (DESYREL) 50 MG tablet Take 25 mg by mouth at bedtime.     atomoxetine (STRATTERA) 40 MG capsule Take 40 mg by mouth 2 (two) times daily with a meal.     Blood Pressure Monitoring (BLOOD PRESSURE CUFF) MISC 1 Device by Does not apply route once for 1 dose. 1 each 0   ezetimibe (ZETIA) 10 MG tablet Take 1 tablet (10 mg total) by mouth daily. 90 tablet 3   No facility-administered medications prior to visit.     Review of Systems:   Constitutional:   No  weight loss, night sweats,  Fevers, chills, fatigue, or  lassitude.  HEENT:   No headaches,  Difficulty swallowing,  Tooth/dental problems, or  Sore throat,                No sneezing, itching, ear ache, nasal congestion, post nasal drip,   CV:  No  chest pain,  Orthopnea, PND, swelling in lower extremities, anasarca, dizziness, palpitations, syncope.   GI  No heartburn, indigestion, abdominal pain, nausea, vomiting, diarrhea, change in bowel habits, loss of appetite, bloody stools.   Resp: No shortness of breath with exertion or at rest.  No excess mucus, no productive cough,  No non-productive cough,  No coughing up of blood.  No change in color of mucus.  No wheezing.  No chest wall deformity  Skin: no rash or lesions.  GU: no dysuria, change in color of urine, no urgency or frequency.  No flank pain, no hematuria   MS:  No joint pain or swelling.  No decreased range of motion.  No back pain.    Physical Exam  BP (!) 122/90 (BP Location: Left Arm, Cuff Size: Large)   Pulse 77   Ht 6\' 2"  (1.88 m)   Wt 266 lb 12.8 oz (121 kg)   SpO2 98%   BMI 34.26 kg/m   GEN: A/Ox3; pleasant , NAD, well nourished    HEENT:  [place]/AT,  NOSE-clear, THROAT-clear, no lesions, no postnasal drip or exudate noted.   NECK:  Supple w/ fair ROM; no JVD; normal carotid impulses w/o bruits; no thyromegaly or nodules palpated; no lymphadenopathy.    RESP  Clear  P & A; w/o, wheezes/ rales/ or rhonchi. no accessory muscle use, no dullness to percussion  CARD:  RRR, no m/r/g, no peripheral edema, pulses intact, no cyanosis or clubbing.  GI:   Soft & nt; nml bowel sounds; no organomegaly or masses detected.   Musco: Warm bil, no deformities or joint swelling noted.   Neuro: alert, no focal deficits noted.    Skin: Warm, no lesions or rashes    Lab Results:  CBC   BNP No results found for: "BNP"  ProBNP    Component Value Date/Time   PROBNP 13.0 09/05/2022 1028     Administration History     None          Latest Ref Rng & Units 09/12/2022   10:52 AM  PFT Results  FVC-Pre L 5.60   FVC-Predicted Pre % 97   FVC-Post L 5.91   FVC-Predicted Post % 103   Pre FEV1/FVC % % 82   Post FEV1/FCV % % 82   FEV1-Pre L 4.60    FEV1-Predicted Pre % 103   FEV1-Post L 4.87   DLCO uncorrected ml/min/mmHg 35.72   DLCO UNC% % 108   DLCO corrected ml/min/mmHg 35.72   DLCO COR %Predicted % 108   DLVA Predicted % 108   TLC L 7.92   TLC % Predicted % 101   RV % Predicted % 96     No results found for: "NITRICOXIDE"      Assessment & Plan:   Assessment and Plan    Obstructive Sleep Apnea   Has severe obstructive sleep apnea-CPAP intolerant. He underwent Inspire device implantation on February 21, 2023, and the device was activated one month ago with settings between 0.2 to 1.2 V He is tolerating it well without issues such as swallowing difficulties or tongue twitching. He reports significant improvement in symptoms, including resolution of snoring and feeling more rested, with compliance at 100% and an average use of 7.5 hours per night. We discussed the importance of not over-stimulating  We will continue the current Inspire device settings, decrease pause time to 10 minutes, set duration to 9 hours, continue to titrate up 1 level each week as tolerated.   Sinus Node Dysfunction   He has sinus node dysfunction, status post dual chamber permanent pacemaker implantation in 2017. No specific issues or changes were discussed during this visit. continue current management and follow up with cardiology as needed.  Coronary Artery Disease   He has coronary artery disease. No specific issues or changes were discussed during this visit. We will continue current management and medications as previously prescribed.        Rubye Oaks, NP 05/06/2023

## 2023-05-06 NOTE — Patient Instructions (Signed)
INSPIRE titration as discussed  Go up by 1 level each week as comfortable .  Follow up in 4 -6 weeks with Dr. Wynona Neat or Jesusita Jocelyn NP  and As needed   Call for any issues or questions.

## 2023-05-08 DIAGNOSIS — F900 Attention-deficit hyperactivity disorder, predominantly inattentive type: Secondary | ICD-10-CM | POA: Diagnosis not present

## 2023-05-08 DIAGNOSIS — F411 Generalized anxiety disorder: Secondary | ICD-10-CM | POA: Diagnosis not present

## 2023-05-08 DIAGNOSIS — F330 Major depressive disorder, recurrent, mild: Secondary | ICD-10-CM | POA: Diagnosis not present

## 2023-05-21 ENCOUNTER — Encounter: Payer: Self-pay | Admitting: Cardiovascular Disease

## 2023-05-22 MED ORDER — AMLODIPINE BESYLATE 5 MG PO TABS: 5.0000 mg | ORAL_TABLET | Freq: Every day | ORAL | 3 refills | Status: DC

## 2023-05-22 NOTE — Telephone Encounter (Signed)
Please increase amlodipine to 5 mg daily 

## 2023-05-31 DIAGNOSIS — S52501A Unspecified fracture of the lower end of right radius, initial encounter for closed fracture: Secondary | ICD-10-CM | POA: Diagnosis not present

## 2023-06-04 ENCOUNTER — Ambulatory Visit: Payer: Medicare HMO | Admitting: Adult Health

## 2023-06-04 ENCOUNTER — Encounter: Payer: Self-pay | Admitting: Adult Health

## 2023-06-04 VITALS — BP 122/74 | HR 64 | Temp 98.1°F | Ht 74.0 in | Wt 262.4 lb

## 2023-06-04 DIAGNOSIS — G4733 Obstructive sleep apnea (adult) (pediatric): Secondary | ICD-10-CM

## 2023-06-04 NOTE — Patient Instructions (Addendum)
INSPIRE titration study next month as discussed.  Go up by 1 level each week as comfortable .  Follow up in 6 weeks with Dr. Wynona Neat or Jayma Volpi NP  and As needed   Call for any issues or questions.

## 2023-06-04 NOTE — Progress Notes (Signed)
@Patient  ID: Mark Strong, male    DOB: Oct 02, 1970, 52 y.o.   MRN: [id number]  Chief Complaint  Patient presents with   Follow-up    Referring provider: Georgina Quint, *  HPI: 52 year old male seen for pulmonary and sleep consult September 05, 2022 for shortness of breath (resolved on follow-up visit with normal chest x-ray and normal PFT) and sleep apnea status post inspire device Medical history significant for coronary disease, sinus node dysfunction status post pacemaker 2017  TEST/EVENTS :  Home sleep study on September 13, 2022 showed moderate sleep apnea with AHI at 44.8/hour.    November 20, 2022 DISE 100% anterior posterior collapse September 5, 2024Hypoglossal nerve stimulator implantation February 21, 2023 April 08, 2023 inspire activation -Sensation level was 0.2 V, functional level 0.2 V. Start delay 30 minutes, pause time 15 minutes and 8-hour duration. Functional level 0.2 to 1.2 V.   06/04/2023 Follow up: OSA  Patient presents for a 1 month follow-up.  Patient has a known history of severe sleep apnea that was CPAP intolerant.  He had the hypoglossal nerve stimulator implantation February 21, 2023.  Inspire device activation on April 08, 2023.  Last visit patient was doing well with significant improvement in symptom burden.  Snoring completely resolved with inspire usage.  Patient has been very compliant with inspire use.  Patient denies any known issues today.  Inspire compliance report shows excellent usage-100%. Daily average usage at 8.5 hours.Current level is 0.2 to 1.2 V, current level is at 0.7 V.  Patient says he is doing very well.  He has went up 1 level since last visit.  Denies any issues with tongue movement or swallowing.   Patient did have a fall last week when he was on a ladder.  Has injured his right arm and shoulder.  Is following with orthopedics.  Is currently in a sling.       Allergies  Allergen Reactions   Betadine [Povidone Iodine]  Rash    Immunization History  Administered Date(s) Administered   Influenza, Seasonal, Injecte, Preservative Fre 03/28/2023   PFIZER Comirnaty(Gray Top)Covid-19 Tri-Sucrose Vaccine 11/21/2020   PFIZER(Purple Top)SARS-COV-2 Vaccination 03/21/2020   Tdap 01/21/2019   Zoster Recombinant(Shingrix) 02/05/2022, 11/01/2022    Past Medical History:  Diagnosis Date   Allergy Dec1,2017   Anxiety    Arthritis 2018   Depression    Dysrhythmia    Heart murmur 2012   Memory loss    Pacemaker    PVNS (pigmented villonodular synovitis)    Sciatica    Sleep apnea    Thyroid disease    Varicose veins of right leg with edema     Tobacco History: Social History   Tobacco Use  Smoking Status Never   Passive exposure: Past  Smokeless Tobacco Never   Counseling given: Not Answered   Outpatient Medications Prior to Visit  Medication Sig Dispense Refill   albuterol (VENTOLIN HFA) 108 (90 Base) MCG/ACT inhaler Inhale 2 puffs into the lungs every 6 (six) hours as needed for wheezing or shortness of breath. 8 g 6   amLODipine (NORVASC) 5 MG tablet Take 1 tablet (5 mg total) by mouth daily. 90 tablet 3   amphetamine-dextroamphetamine (ADDERALL XR) 10 MG 24 hr capsule Take 20 mg by mouth every morning.     aspirin EC 81 MG tablet Take 81 mg by mouth once.     atorvastatin (LIPITOR) 40 MG tablet Take 1 tablet (40 mg total) by mouth daily. 90 tablet 3  BD DISP NEEDLES 25G X 5/8" MISC      buPROPion (WELLBUTRIN XL) 300 MG 24 hr tablet Take 300 mg by mouth daily.     busPIRone (BUSPAR) 5 MG tablet Take 5 mg by mouth 2 (two) times daily.     cholecalciferol (VITAMIN D3) 25 MCG (1000 UNIT) tablet Take 1,000 Units by mouth daily.     cyanocobalamin (VITAMIN B12) 1000 MCG tablet Take 1,000 mcg by mouth daily.     docusate sodium (COLACE) 100 MG capsule Take 100 mg by mouth daily as needed (Constipation).     DULoxetine (CYMBALTA) 60 MG capsule Take 60 mg by mouth 2 (two) times daily.     finasteride  (PROSCAR) 5 MG tablet Take 1 tablet (5 mg total) by mouth daily. 90 tablet 3   ibuprofen (ADVIL) 800 MG tablet Take 800 mg by mouth every 8 (eight) hours as needed for mild pain or moderate pain.     levothyroxine (SYNTHROID) 75 MCG tablet Take 1 tablet (75 mcg total) by mouth every morning. 90 tablet 3   sildenafil (VIAGRA) 25 MG tablet Take 25 mg by mouth as needed for erectile dysfunction.     traZODone (DESYREL) 50 MG tablet Take 25 mg by mouth at bedtime.     Blood Pressure Monitoring (BLOOD PRESSURE CUFF) MISC 1 Device by Does not apply route once for 1 dose. 1 each 0   ezetimibe (ZETIA) 10 MG tablet Take 1 tablet (10 mg total) by mouth daily. 90 tablet 3   No facility-administered medications prior to visit.     Review of Systems:   Constitutional:   No  weight loss, night sweats,  Fevers, chills, fatigue, or  lassitude.  HEENT:   No headaches,  Difficulty swallowing,  Tooth/dental problems, or  Sore throat,                No sneezing, itching, ear ache, nasal congestion, post nasal drip,   CV:  No chest pain,  Orthopnea, PND, swelling in lower extremities, anasarca, dizziness, palpitations, syncope.   GI  No heartburn, indigestion, abdominal pain, nausea, vomiting, diarrhea, change in bowel habits, loss of appetite, bloody stools.   Resp: No shortness of breath with exertion or at rest.  No excess mucus, no productive cough,  No non-productive cough,  No coughing up of blood.  No change in color of mucus.  No wheezing.  No chest wall deformity  Skin: no rash or lesions.  GU: no dysuria, change in color of urine, no urgency or frequency.  No flank pain, no hematuria   MS: Right arm injury   Physical Exam  BP 122/74 (BP Location: Left Arm, Patient Position: Sitting, Cuff Size: Normal)   Pulse 64   Temp 98.1 F (36.7 C) (Oral)   Ht 6\' 2"  (1.88 m)   Wt 262 lb 6.4 oz (119 kg)   SpO2 100%   BMI 33.69 kg/m   GEN: A/Ox3; pleasant , NAD, well nourished    HEENT:  [place]/AT,   NOSE-clear, THROAT-clear, no lesions, no postnasal drip or exudate noted.  Normal tongue movement.  NECK:  Supple w/ fair ROM; no JVD; normal carotid impulses w/o bruits; no thyromegaly or nodules palpated; no lymphadenopathy.    RESP  Clear  P & A; w/o, wheezes/ rales/ or rhonchi. no accessory muscle use, no dullness to percussion  CARD:  RRR, no m/r/g, no peripheral edema, pulses intact, no cyanosis or clubbing.  GI:   Soft & nt; nml  bowel sounds; no organomegaly or masses detected.   Musco: Warm bil,   Right arm brace  Neuro: alert, no focal deficits noted.    Skin: Warm, no lesions or rashes    Lab Results:  CBC   BMET   BNP No results found for: "BNP"  ProBNP   Imaging: No results found.  Administration History     None          Latest Ref Rng & Units 09/12/2022   10:52 AM  PFT Results  FVC-Pre L 5.60   FVC-Predicted Pre % 97   FVC-Post L 5.91   FVC-Predicted Post % 103   Pre FEV1/FVC % % 82   Post FEV1/FCV % % 82   FEV1-Pre L 4.60   FEV1-Predicted Pre % 103   FEV1-Post L 4.87   DLCO uncorrected ml/min/mmHg 35.72   DLCO UNC% % 108   DLCO corrected ml/min/mmHg 35.72   DLCO COR %Predicted % 108   DLVA Predicted % 108   TLC L 7.92   TLC % Predicted % 101   RV % Predicted % 96     No results found for: "NITRICOXIDE"      Assessment & Plan:   OSA (obstructive sleep apnea) Moderate obstructive sleep apnea with CPAP and oral appliance intolerance.  He is status post hypoglossal nerve stimulator implantation February 21, 2023.  Activation April 08, 2023.  Patient has done exceptionally well.  Is tolerating inspire well.  Has excellent compliance.  Waveform today is normal.  Will continue with current settings.  Have encouraged patient to go up by 1 level (0.1 V) weekly as tolerated.  Will set patient up for inspire titration study in 1 month.     I spent   30 minutes dedicated to the care of this patient on the date of this encounter to  include pre-visit review of records, face-to-face time with the patient discussing conditions above, post visit ordering of testing, clinical documentation with the electronic health record, making appropriate referrals as documented, and communicating necessary findings to members of the patients care team.   Rubye Oaks, NP 06/04/2023

## 2023-06-04 NOTE — Assessment & Plan Note (Signed)
Moderate obstructive sleep apnea with CPAP and oral appliance intolerance.  He is status post hypoglossal nerve stimulator implantation February 21, 2023.  Activation April 08, 2023.  Patient has done exceptionally well.  Is tolerating inspire well.  Has excellent compliance.  Waveform today is normal.  Will continue with current settings.  Have encouraged patient to go up by 1 level (0.1 V) weekly as tolerated.  Will set patient up for inspire titration study in 1 month.

## 2023-06-05 DIAGNOSIS — F330 Major depressive disorder, recurrent, mild: Secondary | ICD-10-CM | POA: Diagnosis not present

## 2023-06-05 DIAGNOSIS — F900 Attention-deficit hyperactivity disorder, predominantly inattentive type: Secondary | ICD-10-CM | POA: Diagnosis not present

## 2023-06-05 DIAGNOSIS — F411 Generalized anxiety disorder: Secondary | ICD-10-CM | POA: Diagnosis not present

## 2023-06-06 DIAGNOSIS — S52501D Unspecified fracture of the lower end of right radius, subsequent encounter for closed fracture with routine healing: Secondary | ICD-10-CM | POA: Diagnosis not present

## 2023-06-10 DIAGNOSIS — M5412 Radiculopathy, cervical region: Secondary | ICD-10-CM | POA: Diagnosis not present

## 2023-06-10 DIAGNOSIS — M546 Pain in thoracic spine: Secondary | ICD-10-CM | POA: Diagnosis not present

## 2023-06-10 DIAGNOSIS — M25511 Pain in right shoulder: Secondary | ICD-10-CM | POA: Diagnosis not present

## 2023-06-13 DIAGNOSIS — S52551A Other extraarticular fracture of lower end of right radius, initial encounter for closed fracture: Secondary | ICD-10-CM | POA: Diagnosis not present

## 2023-06-17 ENCOUNTER — Other Ambulatory Visit (HOSPITAL_COMMUNITY): Payer: Self-pay | Admitting: Orthopedic Surgery

## 2023-06-17 DIAGNOSIS — M542 Cervicalgia: Secondary | ICD-10-CM

## 2023-06-17 DIAGNOSIS — M546 Pain in thoracic spine: Secondary | ICD-10-CM

## 2023-07-01 ENCOUNTER — Other Ambulatory Visit: Payer: Self-pay | Admitting: Cardiovascular Disease

## 2023-07-09 ENCOUNTER — Ambulatory Visit (INDEPENDENT_AMBULATORY_CARE_PROVIDER_SITE_OTHER): Payer: Medicare HMO

## 2023-07-09 DIAGNOSIS — I495 Sick sinus syndrome: Secondary | ICD-10-CM

## 2023-07-09 LAB — CUP PACEART REMOTE DEVICE CHECK
Date Time Interrogation Session: 20250121080916
Implantable Lead Connection Status: 753985
Implantable Lead Connection Status: 753985
Implantable Lead Implant Date: 20171217
Implantable Lead Implant Date: 20171217
Implantable Lead Location: 753859
Implantable Lead Location: 753860
Implantable Lead Model: 377
Implantable Lead Model: 377
Implantable Lead Serial Number: 49336100
Implantable Lead Serial Number: 49685190
Implantable Pulse Generator Implant Date: 20171217
Pulse Gen Model: 394929
Pulse Gen Serial Number: 68775417

## 2023-07-13 ENCOUNTER — Encounter: Payer: Self-pay | Admitting: Cardiovascular Disease

## 2023-07-15 ENCOUNTER — Encounter: Payer: Self-pay | Admitting: Cardiovascular Disease

## 2023-07-15 MED ORDER — AMLODIPINE BESYLATE 5 MG PO TABS: 7.5000 mg | ORAL_TABLET | Freq: Every day | ORAL | Status: DC

## 2023-07-15 NOTE — Telephone Encounter (Signed)
May just need a little bit of tweaking to get to 130/80 range. Please increase amlodipine to 7.5 mg daily. Give it 2 weeks to see full effect and then send in BP log. Let us know if ankles swell.

## 2023-07-16 ENCOUNTER — Encounter (HOSPITAL_BASED_OUTPATIENT_CLINIC_OR_DEPARTMENT_OTHER): Payer: Medicare HMO | Admitting: Pulmonary Disease

## 2023-07-16 ENCOUNTER — Ambulatory Visit (HOSPITAL_COMMUNITY)
Admission: RE | Admit: 2023-07-16 | Discharge: 2023-07-16 | Disposition: A | Discharge status: Home / Self Care | Payer: Medicare HMO | Source: Ambulatory Visit | Attending: Orthopedic Surgery | Admitting: Orthopedic Surgery

## 2023-07-16 DIAGNOSIS — M546 Pain in thoracic spine: Secondary | ICD-10-CM | POA: Insufficient documentation

## 2023-07-16 DIAGNOSIS — M542 Cervicalgia: Secondary | ICD-10-CM | POA: Diagnosis not present

## 2023-07-16 DIAGNOSIS — M4313 Spondylolisthesis, cervicothoracic region: Secondary | ICD-10-CM | POA: Diagnosis not present

## 2023-07-16 DIAGNOSIS — M5124 Other intervertebral disc displacement, thoracic region: Secondary | ICD-10-CM | POA: Diagnosis not present

## 2023-07-16 DIAGNOSIS — M47814 Spondylosis without myelopathy or radiculopathy, thoracic region: Secondary | ICD-10-CM | POA: Diagnosis not present

## 2023-07-16 DIAGNOSIS — M4804 Spinal stenosis, thoracic region: Secondary | ICD-10-CM | POA: Diagnosis not present

## 2023-07-16 DIAGNOSIS — M5023 Other cervical disc displacement, cervicothoracic region: Secondary | ICD-10-CM | POA: Diagnosis not present

## 2023-07-16 DIAGNOSIS — S22089A Unspecified fracture of T11-T12 vertebra, initial encounter for closed fracture: Secondary | ICD-10-CM | POA: Diagnosis not present

## 2023-07-16 DIAGNOSIS — M47812 Spondylosis without myelopathy or radiculopathy, cervical region: Secondary | ICD-10-CM | POA: Diagnosis not present

## 2023-07-16 DIAGNOSIS — M4802 Spinal stenosis, cervical region: Secondary | ICD-10-CM | POA: Diagnosis not present

## 2023-07-17 DIAGNOSIS — F411 Generalized anxiety disorder: Secondary | ICD-10-CM | POA: Diagnosis not present

## 2023-07-17 DIAGNOSIS — F900 Attention-deficit hyperactivity disorder, predominantly inattentive type: Secondary | ICD-10-CM | POA: Diagnosis not present

## 2023-07-17 DIAGNOSIS — F330 Major depressive disorder, recurrent, mild: Secondary | ICD-10-CM | POA: Diagnosis not present

## 2023-07-18 DIAGNOSIS — S52551A Other extraarticular fracture of lower end of right radius, initial encounter for closed fracture: Secondary | ICD-10-CM | POA: Diagnosis not present

## 2023-08-02 DIAGNOSIS — M542 Cervicalgia: Secondary | ICD-10-CM | POA: Diagnosis not present

## 2023-08-13 ENCOUNTER — Encounter: Payer: Self-pay | Admitting: Cardiovascular Disease

## 2023-08-14 DIAGNOSIS — F900 Attention-deficit hyperactivity disorder, predominantly inattentive type: Secondary | ICD-10-CM | POA: Diagnosis not present

## 2023-08-14 DIAGNOSIS — F330 Major depressive disorder, recurrent, mild: Secondary | ICD-10-CM | POA: Diagnosis not present

## 2023-08-14 DIAGNOSIS — F411 Generalized anxiety disorder: Secondary | ICD-10-CM | POA: Diagnosis not present

## 2023-08-14 MED ORDER — AMLODIPINE BESYLATE 10 MG PO TABS: 10.0000 mg | ORAL_TABLET | Freq: Every day | ORAL | 1 refills | Status: DC

## 2023-08-14 NOTE — Telephone Encounter (Signed)
 Let's increase the amlodipine to 10 mg daily please.

## 2023-08-15 ENCOUNTER — Encounter: Payer: Self-pay | Admitting: Emergency Medicine

## 2023-08-15 ENCOUNTER — Ambulatory Visit (INDEPENDENT_AMBULATORY_CARE_PROVIDER_SITE_OTHER): Payer: Medicare HMO | Admitting: Emergency Medicine

## 2023-08-15 VITALS — BP 120/88 | HR 86 | Temp 98.0°F | Ht 74.0 in | Wt 272.0 lb

## 2023-08-15 DIAGNOSIS — I10 Essential (primary) hypertension: Secondary | ICD-10-CM

## 2023-08-15 DIAGNOSIS — F419 Anxiety disorder, unspecified: Secondary | ICD-10-CM | POA: Diagnosis not present

## 2023-08-15 DIAGNOSIS — G8929 Other chronic pain: Secondary | ICD-10-CM | POA: Diagnosis not present

## 2023-08-15 DIAGNOSIS — E221 Hyperprolactinemia: Secondary | ICD-10-CM

## 2023-08-15 DIAGNOSIS — E7800 Pure hypercholesterolemia, unspecified: Secondary | ICD-10-CM | POA: Diagnosis not present

## 2023-08-15 DIAGNOSIS — I484 Atypical atrial flutter: Secondary | ICD-10-CM

## 2023-08-15 DIAGNOSIS — E039 Hypothyroidism, unspecified: Secondary | ICD-10-CM | POA: Diagnosis not present

## 2023-08-15 DIAGNOSIS — M549 Dorsalgia, unspecified: Secondary | ICD-10-CM

## 2023-08-15 DIAGNOSIS — G4733 Obstructive sleep apnea (adult) (pediatric): Secondary | ICD-10-CM

## 2023-08-15 DIAGNOSIS — Z125 Encounter for screening for malignant neoplasm of prostate: Secondary | ICD-10-CM

## 2023-08-15 DIAGNOSIS — S52551A Other extraarticular fracture of lower end of right radius, initial encounter for closed fracture: Secondary | ICD-10-CM | POA: Diagnosis not present

## 2023-08-15 DIAGNOSIS — F32A Depression, unspecified: Secondary | ICD-10-CM

## 2023-08-15 LAB — COMPREHENSIVE METABOLIC PANEL
ALT: 23 U/L (ref 0–53)
AST: 18 U/L (ref 0–37)
Albumin: 4.5 g/dL (ref 3.5–5.2)
Alkaline Phosphatase: 64 U/L (ref 39–117)
BUN: 15 mg/dL (ref 6–23)
CO2: 29 meq/L (ref 19–32)
Calcium: 9 mg/dL (ref 8.4–10.5)
Chloride: 106 meq/L (ref 96–112)
Creatinine, Ser: 1.11 mg/dL (ref 0.40–1.50)
GFR: 76.14 mL/min (ref >60.00)
Glucose, Bld: 90 mg/dL (ref 70–99)
Potassium: 4.1 meq/L (ref 3.5–5.1)
Sodium: 142 meq/L (ref 135–145)
Total Bilirubin: 0.5 mg/dL (ref 0.2–1.2)
Total Protein: 6.9 g/dL (ref 6.0–8.3)

## 2023-08-15 LAB — CBC WITH DIFFERENTIAL/PLATELET
Basophils Absolute: 0 10*3/uL (ref 0.0–0.1)
Basophils Relative: 0.7 % (ref 0.0–3.0)
Eosinophils Absolute: 0.2 10*3/uL (ref 0.0–0.7)
Eosinophils Relative: 3.5 % (ref 0.0–5.0)
HCT: 42.6 % (ref 39.0–52.0)
Hemoglobin: 14.2 g/dL (ref 13.0–17.0)
Lymphocytes Relative: 27 % (ref 12.0–46.0)
Lymphs Abs: 1.7 10*3/uL (ref 0.7–4.0)
MCHC: 33.4 g/dL (ref 30.0–36.0)
MCV: 85.1 fL (ref 78.0–100.0)
Monocytes Absolute: 0.5 10*3/uL (ref 0.1–1.0)
Monocytes Relative: 7.3 % (ref 3.0–12.0)
Neutro Abs: 3.9 10*3/uL (ref 1.4–7.7)
Neutrophils Relative %: 61.5 % (ref 43.0–77.0)
Platelets: 248 10*3/uL (ref 150.0–400.0)
RBC: 5.01 Mil/uL (ref 4.22–5.81)
RDW: 14.1 % (ref 11.5–15.5)
WBC: 6.3 10*3/uL (ref 4.0–10.5)

## 2023-08-15 LAB — LIPID PANEL
Cholesterol: 148 mg/dL (ref 0–200)
HDL: 50.7 mg/dL (ref >39.00)
LDL Cholesterol: 77 mg/dL (ref 0–99)
NonHDL: 97.15
Total CHOL/HDL Ratio: 3
Triglycerides: 99 mg/dL (ref 0.0–149.0)
VLDL: 19.8 mg/dL (ref 0.0–40.0)

## 2023-08-15 LAB — PSA: PSA: 1.04 ng/mL (ref 0.10–4.00)

## 2023-08-15 LAB — TSH: TSH: 1.32 u[IU]/mL (ref 0.35–5.50)

## 2023-08-15 LAB — HEMOGLOBIN A1C: Hgb A1c MFr Bld: 5.5 % (ref 4.6–6.5)

## 2023-08-15 NOTE — Assessment & Plan Note (Signed)
 Chronic stable condition Continues on atorvastatin 40 mg daily

## 2023-08-15 NOTE — Assessment & Plan Note (Signed)
Sees psychiatrist on a regular basis Medications handled by their office 

## 2023-08-15 NOTE — Assessment & Plan Note (Signed)
 Normal most recent levels No history of prolactinoma

## 2023-08-15 NOTE — Assessment & Plan Note (Signed)
 Atrial fibrillation Pacemaker in place Not on beta-blocker Takes 1 daily baby aspirin

## 2023-08-15 NOTE — Assessment & Plan Note (Signed)
 Well-controlled hypertension Continue amlodipine 10 mg daily Cardiovascular risks associated with hypertension discussed Blood work done today Dietary approaches to stop hypertension discussed Follow-up in 6 months

## 2023-08-15 NOTE — Assessment & Plan Note (Signed)
 Stable.  Sees orthopedist on a regular basis

## 2023-08-15 NOTE — Patient Instructions (Signed)
 Hypertension, Adult High blood pressure (hypertension) is when the force of blood pumping through the arteries is too strong. The arteries are the blood vessels that carry blood from the heart throughout the body. Hypertension forces the heart to work harder to pump blood and may cause arteries to become narrow or stiff. Untreated or uncontrolled hypertension can lead to a heart attack, heart failure, a stroke, kidney disease, and other problems. A blood pressure reading consists of a higher number over a lower number. Ideally, your blood pressure should be below 120/80. The first ("top") number is called the systolic pressure. It is a measure of the pressure in your arteries as your heart beats. The second ("bottom") number is called the diastolic pressure. It is a measure of the pressure in your arteries as the heart relaxes. What are the causes? The exact cause of this condition is not known. There are some conditions that result in high blood pressure. What increases the risk? Certain factors may make you more likely to develop high blood pressure. Some of these risk factors are under your control, including: Smoking. Not getting enough exercise or physical activity. Being overweight. Having too much fat, sugar, calories, or salt (sodium) in your diet. Drinking too much alcohol. Other risk factors include: Having a personal history of heart disease, diabetes, high cholesterol, or kidney disease. Stress. Having a family history of high blood pressure and high cholesterol. Having obstructive sleep apnea. Age. The risk increases with age. What are the signs or symptoms? High blood pressure may not cause symptoms. Very high blood pressure (hypertensive crisis) may cause: Headache. Fast or irregular heartbeats (palpitations). Shortness of breath. Nosebleed. Nausea and vomiting. Vision changes. Severe chest pain, dizziness, and seizures. How is this diagnosed? This condition is diagnosed by  measuring your blood pressure while you are seated, with your arm resting on a flat surface, your legs uncrossed, and your feet flat on the floor. The cuff of the blood pressure monitor will be placed directly against the skin of your upper arm at the level of your heart. Blood pressure should be measured at least twice using the same arm. Certain conditions can cause a difference in blood pressure between your right and left arms. If you have a high blood pressure reading during one visit or you have normal blood pressure with other risk factors, you may be asked to: Return on a different day to have your blood pressure checked again. Monitor your blood pressure at home for 1 week or longer. If you are diagnosed with hypertension, you may have other blood or imaging tests to help your health care provider understand your overall risk for other conditions. How is this treated? This condition is treated by making healthy lifestyle changes, such as eating healthy foods, exercising more, and reducing your alcohol intake. You may be referred for counseling on a healthy diet and physical activity. Your health care provider may prescribe medicine if lifestyle changes are not enough to get your blood pressure under control and if: Your systolic blood pressure is above 130. Your diastolic blood pressure is above 80. Your personal target blood pressure may vary depending on your medical conditions, your age, and other factors. Follow these instructions at home: Eating and drinking  Eat a diet that is high in fiber and potassium, and low in sodium, added sugar, and fat. An example of this eating plan is called the DASH diet. DASH stands for Dietary Approaches to Stop Hypertension. To eat this way: Eat  plenty of fresh fruits and vegetables. Try to fill one half of your plate at each meal with fruits and vegetables. Eat whole grains, such as whole-wheat pasta, brown rice, or whole-grain bread. Fill about one  fourth of your plate with whole grains. Eat or drink low-fat dairy products, such as skim milk or low-fat yogurt. Avoid fatty cuts of meat, processed or cured meats, and poultry with skin. Fill about one fourth of your plate with lean proteins, such as fish, chicken without skin, beans, eggs, or tofu. Avoid pre-made and processed foods. These tend to be higher in sodium, added sugar, and fat. Reduce your daily sodium intake. Many people with hypertension should eat less than 1,500 mg of sodium a day. Do not drink alcohol if: Your health care provider tells you not to drink. You are pregnant, may be pregnant, or are planning to become pregnant. If you drink alcohol: Limit how much you have to: 0-1 drink a day for women. 0-2 drinks a day for men. Know how much alcohol is in your drink. In the [place] one drink equals one 12 oz bottle of beer (355 mL), one 5 oz glass of wine (148 mL), or one 1 oz glass of hard liquor (44 mL). Lifestyle  Work with your health care provider to maintain a healthy body weight or to lose weight. Ask what an ideal weight is for you. Get at least 30 minutes of exercise that causes your heart to beat faster (aerobic exercise) most days of the week. Activities may include walking, swimming, or biking. Include exercise to strengthen your muscles (resistance exercise), such as Pilates or lifting weights, as part of your weekly exercise routine. Try to do these types of exercises for 30 minutes at least 3 days a week. Do not use any products that contain nicotine or tobacco. These products include cigarettes, chewing tobacco, and vaping devices, such as e-cigarettes. If you need help quitting, ask your health care provider. Monitor your blood pressure at home as told by your health care provider. Keep all follow-up visits. This is important. Medicines Take over-the-counter and prescription medicines only as told by your health care provider. Follow directions carefully. Blood  pressure medicines must be taken as prescribed. Do not skip doses of blood pressure medicine. Doing this puts you at risk for problems and can make the medicine less effective. Ask your health care provider about side effects or reactions to medicines that you should watch for. Contact a health care provider if you: Think you are having a reaction to a medicine you are taking. Have headaches that keep coming back (recurring). Feel dizzy. Have swelling in your ankles. Have trouble with your vision. Get help right away if you: Develop a severe headache or confusion. Have unusual weakness or numbness. Feel faint. Have severe pain in your chest or abdomen. Vomit repeatedly. Have trouble breathing. These symptoms may be an emergency. Get help right away. Call 911. Do not wait to see if the symptoms will go away. Do not drive yourself to the hospital. Summary Hypertension is when the force of blood pumping through your arteries is too strong. If this condition is not controlled, it may put you at risk for serious complications. Your personal target blood pressure may vary depending on your medical conditions, your age, and other factors. For most people, a normal blood pressure is less than 120/80. Hypertension is treated with lifestyle changes, medicines, or a combination of both. Lifestyle changes include losing weight, eating a healthy,  low-sodium diet, exercising more, and limiting alcohol. This information is not intended to replace advice given to you by your health care provider. Make sure you discuss any questions you have with your health care provider. Document Revised: 04/11/2021 Document Reviewed: 04/11/2021 [employer] Patient Education  2024 [employer].

## 2023-08-15 NOTE — Assessment & Plan Note (Signed)
Clinically euthyroid.  TSH done today. Continue Synthroid 75 mcg daily. 

## 2023-08-15 NOTE — Progress Notes (Signed)
 Mark Strong 53 y.o.   Chief Complaint  Patient presents with   Follow-up    6 month f/u. Patient did not do the colonoscopy, he did a cologuard test done and the results were negative     HISTORY OF PRESENT ILLNESS: This is a 53 y.o. male A1A here for 54-month follow-up of chronic medical conditions Was started on amlodipine for hypertension Since his last visit he was able to get inspire procedure done.  Working well for him.  Sleeping much better. Recently fell and sustained fracture of right wrist.  Healing well. Up-to-date with colon cancer screening Has no complaints or any other medical concerns today.  HPI   Prior to Admission medications   Medication Sig Start Date End Date Taking? Authorizing Provider  albuterol (VENTOLIN HFA) 108 (90 Base) MCG/ACT inhaler Inhale 2 puffs into the lungs every 6 (six) hours as needed for wheezing or shortness of breath. 09/05/22  Yes [name], MD  amLODipine (NORVASC) 10 MG tablet Take 1 tablet (10 mg total) by mouth daily. 08/14/23  Yes [name], [name], MD  amphetamine-dextroamphetamine (ADDERALL XR) 10 MG 24 hr capsule Take 20 mg by mouth every morning. 04/18/23  Yes [provider]  aspirin EC 81 MG tablet Take 81 mg by mouth once. 05/04/20  Yes [provider]  atorvastatin (LIPITOR) 40 MG tablet TAKE 1 TABLET(40 MG) BY MOUTH DAILY 07/03/23  Yes [name], [name], MD  BD DISP NEEDLES 25G X 5/8" MISC  09/06/22  Yes [provider]  buPROPion (WELLBUTRIN XL) 300 MG 24 hr tablet Take 300 mg by mouth daily. 09/12/22  Yes [provider]  busPIRone (BUSPAR) 5 MG tablet Take 5 mg by mouth 2 (two) times daily. 03/28/23  Yes [provider]  cholecalciferol (VITAMIN D3) 25 MCG (1000 UNIT) tablet Take 1,000 Units by mouth daily.   Yes [provider]  cyanocobalamin (VITAMIN B12) 1000 MCG tablet Take 1,000 mcg by mouth daily.   Yes [provider]  docusate sodium (COLACE)  100 MG capsule Take 100 mg by mouth daily as needed (Constipation).   Yes [provider]  DULoxetine (CYMBALTA) 60 MG capsule Take 60 mg by mouth 2 (two) times daily. 11/07/20  Yes [provider]  finasteride (PROSCAR) 5 MG tablet Take 1 tablet (5 mg total) by mouth daily. 02/12/23  Yes Tremane Spurgeon, [name], MD  ibuprofen (ADVIL) 800 MG tablet Take 800 mg by mouth every 8 (eight) hours as needed for mild pain or moderate pain. 02/24/20  Yes [provider]  levothyroxine (SYNTHROID) 75 MCG tablet Take 1 tablet (75 mcg total) by mouth every morning. 12/19/22  Yes Enedina Pair, [name], MD  sildenafil (VIAGRA) 25 MG tablet Take 25 mg by mouth as needed for erectile dysfunction. 06/08/21  Yes [provider]  traZODone (DESYREL) 50 MG tablet Take 25 mg by mouth at bedtime. 06/20/22  Yes [provider]  Blood Pressure Monitoring (BLOOD PRESSURE CUFF) MISC 1 Device by Does not apply route once for 1 dose. 10/15/18 02/05/23  [name], [name], MD  ezetimibe (ZETIA) 10 MG tablet Take 1 tablet (10 mg total) by mouth daily. 01/10/23 04/10/23  [name], [name], MD    Allergies  Allergen Reactions   Betadine [Povidone Iodine] Rash    Patient Active Problem List   Diagnosis Date Noted   Bilateral renal cysts 02/12/2023   Chronic anxiety 12/19/2022   Chronic depression 12/19/2022   Memory difficulties 09/04/2022   Varicose veins of both  lower extremities 03/13/2019   Atypical atrial flutter (HCC) 12/18/2018   Hyperprolactinemia (HCC) 10/28/2018   Hypercholesterolemia 10/15/2018   SSS (sick sinus syndrome) (HCC) 08/20/2018   Pacemaker 05/28/2018   OSA (obstructive sleep apnea) 05/28/2018   Chondromalacia of left knee 05/23/2018   Chondromalacia of right patella 05/23/2018   Hypothyroidism 04/28/2018   Right thyroid nodule 04/28/2018   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   Chronic back pain 03/14/2018   Bradycardia 03/14/2018    Past Medical History:   Diagnosis Date   Allergy Dec1,2017   Anxiety    Arthritis 2018   Depression    Dysrhythmia    Heart murmur 2012   Memory loss    Pacemaker    PVNS (pigmented villonodular synovitis)    Sciatica    Sleep apnea    Thyroid disease    Varicose veins of right leg with edema     Past Surgical History:  Procedure Laterality Date   CARDIAC CATHETERIZATION     left and right    DRUG INDUCED ENDOSCOPY Bilateral 11/20/2022   Procedure: DRUG INDUCED SLEEP  ENDOSCOPY;  Surgeon: Christia Reading, MD;  Location: [facility];  Service: ENT;  Laterality: Bilateral;   IMPLANTATION OF HYPOGLOSSAL NERVE STIMULATOR Right 02/21/2023   Procedure: IMPLANTATION OF HYPOGLOSSAL NERVE STIMULATOR;  Surgeon: Christia Reading, MD;  Location: [facility] OR;  Service: ENT;  Laterality: Right;   INSERT / REPLACE / REMOVE PACEMAKER     Biotronik, dual-chamber, 05/18/2016 for SSS   Right greater saphenous vein ablation Right     Social History   Socioeconomic History   Marital status: Single    Spouse name: Not on file   Number of children: 0   Years of education: 12   Highest education level: 12th grade  Occupational History   Occupation: Disabled  Tobacco Use   Smoking status: Never    Passive exposure: Past   Smokeless tobacco: Never  Vaping Use   Vaping status: Never Used  Substance and Sexual Activity   Alcohol use: Never   Drug use: Never   Sexual activity: Not Currently    Birth control/protection: Condom  Other Topics Concern   Not on file  Social History Narrative   Right handed   Sugar free    Mom lives with him.   Disabled    One floor home   Social Drivers of Health   Financial Resource Strain: Low Risk  (03/28/2023)   Overall Financial Resource Strain (CARDIA)    Difficulty of Paying Living Expenses: Not very hard  Food Insecurity: No Food Insecurity (03/28/2023)   Hunger Vital Sign    Worried About Running Out of Food in the Last Year: Never true    Ran Out of Food in the  Last Year: Never true  Transportation Needs: No Transportation Needs (03/28/2023)   PRAPARE - Administrator, Civil Service (Medical): No    Lack of Transportation (Non-Medical): No  Physical Activity: Insufficiently Active (03/28/2023)   Exercise Vital Sign    Days of Exercise per Week: 3 days    Minutes of Exercise per Session: 30 min  Stress: No Stress Concern Present (03/28/2023)   [employer] of Occupational Health - Occupational Stress Questionnaire    Feeling of Stress : Not at all  Recent Concern: Stress - Stress Concern Present (02/12/2023)   [employer] of Occupational Health - Occupational Stress Questionnaire    Feeling of Stress : To some extent  Social Connections: Moderately  Isolated (03/28/2023)   Social Connection and Isolation Panel [NHANES]    Frequency of Communication with Friends and Family: Never    Frequency of Social Gatherings with Friends and Family: More than three times a week    Attends Religious Services: 1 to 4 times per year    Active Member of [employer] or Organizations: No    Attends Banker Meetings: Never    Marital Status: Never married  Intimate Partner Violence: Patient Unable To Answer (03/28/2023)   Humiliation, Afraid, Rape, and Kick questionnaire    Fear of Current or Ex-Partner: Patient unable to answer    Emotionally Abused: Patient unable to answer    Physically Abused: Patient unable to answer    Sexually Abused: Patient unable to answer    Family History  Problem Relation Age of Onset   Arthritis Mother    Depression Mother    Heart disease Mother    Obesity Mother    Arthritis Father    COPD Father    Diabetes Father    Hyperlipidemia Father    Hypertension Father    Kidney disease Father    Thyroid disease Father    Cancer Brother      Review of Systems  Constitutional: Negative.  Negative for chills and fever.  HENT: Negative.  Negative for congestion and sore throat.   Respiratory:  Negative.  Negative for cough and shortness of breath.   Cardiovascular: Negative.  Negative for chest pain and palpitations.  Gastrointestinal:  Negative for abdominal pain, diarrhea, nausea and vomiting.  Genitourinary: Negative.  Negative for dysuria and hematuria.  Skin: Negative.  Negative for rash.  Neurological: Negative.  Negative for dizziness and headaches.  All other systems reviewed and are negative.   Vitals:   08/15/23 0939  BP: 120/88  Pulse: 86  Temp: 98 F (36.7 C)  SpO2: 96%    Physical Exam Vitals reviewed.  Constitutional:      Appearance: Normal appearance.  HENT:     Head: Normocephalic.     Mouth/Throat:     Mouth: Mucous membranes are moist.     Pharynx: Oropharynx is clear.  Eyes:     Extraocular Movements: Extraocular movements intact.     Conjunctiva/sclera: Conjunctivae normal.     Pupils: Pupils are equal, round, and reactive to light.  Cardiovascular:     Rate and Rhythm: Normal rate and regular rhythm.     Pulses: Normal pulses.     Heart sounds: Normal heart sounds.  Pulmonary:     Effort: Pulmonary effort is normal.     Breath sounds: Normal breath sounds.  Abdominal:     Palpations: Abdomen is soft.     Tenderness: There is no abdominal tenderness.  Musculoskeletal:     Cervical back: No tenderness.     Comments: Wearing Velcro splint on right wrist due to recent fracture  Lymphadenopathy:     Cervical: No cervical adenopathy.  Skin:    General: Skin is warm and dry.     Capillary Refill: Capillary refill takes less than 2 seconds.  Neurological:     General: No focal deficit present.     Mental Status: He is alert and oriented to person, place, and time.  Psychiatric:        Mood and Affect: Mood normal.        Behavior: Behavior normal.      ASSESSMENT & PLAN: A total of 45 minutes was spent with the patient and counseling/coordination  of care regarding preparing for this visit, review of most recent office visit notes,  review of multiple chronic medical conditions and their management, cardiovascular risks associated with hypertension, review of all medications, review of most recent bloodwork results, review of health maintenance items, education on nutrition, prognosis, documentation, and need for follow up.   Problem List Items Addressed This Visit       Cardiovascular and Mediastinum   Atypical atrial flutter (HCC)   Atrial fibrillation Pacemaker in place Not on beta-blocker Takes 1 daily baby aspirin      Essential hypertension - Primary   Well-controlled hypertension Continue amlodipine 10 mg daily Cardiovascular risks associated with hypertension discussed Blood work done today Dietary approaches to stop hypertension discussed Follow-up in 6 months      Relevant Orders   CBC with Differential/Platelet   Comprehensive metabolic panel   Hemoglobin A1c     Respiratory   OSA (obstructive sleep apnea)   Much improved after inspire procedure        Endocrine   Hypothyroidism   Clinically euthyroid TSH done today Continue Synthroid 75 mcg daily      Relevant Orders   TSH   Hyperprolactinemia (HCC)   Normal most recent levels No history of prolactinoma        Other   Chronic back pain   Stable. Sees orthopedist on a regular basis       Hypercholesterolemia   Chronic stable condition Continues on atorvastatin 40 mg daily      Relevant Orders   Lipid panel   Chronic anxiety   Sees psychiatrist on a regular basis Medications handled by their office      Chronic depression   Sees psychiatrist on a regular basis Medications handled by their office      Other Visit Diagnoses       Screening for prostate cancer       Relevant Orders   PSA      Patient Instructions  Hypertension, Adult High blood pressure (hypertension) is when the force of blood pumping through the arteries is too strong. The arteries are the blood vessels that carry blood from the heart  throughout the body. Hypertension forces the heart to work harder to pump blood and may cause arteries to become narrow or stiff. Untreated or uncontrolled hypertension can lead to a heart attack, heart failure, a stroke, kidney disease, and other problems. A blood pressure reading consists of a higher number over a lower number. Ideally, your blood pressure should be below 120/80. The first ("top") number is called the systolic pressure. It is a measure of the pressure in your arteries as your heart beats. The second ("bottom") number is called the diastolic pressure. It is a measure of the pressure in your arteries as the heart relaxes. What are the causes? The exact cause of this condition is not known. There are some conditions that result in high blood pressure. What increases the risk? Certain factors may make you more likely to develop high blood pressure. Some of these risk factors are under your control, including: Smoking. Not getting enough exercise or physical activity. Being overweight. Having too much fat, sugar, calories, or salt (sodium) in your diet. Drinking too much alcohol. Other risk factors include: Having a personal history of heart disease, diabetes, high cholesterol, or kidney disease. Stress. Having a family history of high blood pressure and high cholesterol. Having obstructive sleep apnea. Age. The risk increases with age. What are the signs or  symptoms? High blood pressure may not cause symptoms. Very high blood pressure (hypertensive crisis) may cause: Headache. Fast or irregular heartbeats (palpitations). Shortness of breath. Nosebleed. Nausea and vomiting. Vision changes. Severe chest pain, dizziness, and seizures. How is this diagnosed? This condition is diagnosed by measuring your blood pressure while you are seated, with your arm resting on a flat surface, your legs uncrossed, and your feet flat on the floor. The cuff of the blood pressure monitor will be  placed directly against the skin of your upper arm at the level of your heart. Blood pressure should be measured at least twice using the same arm. Certain conditions can cause a difference in blood pressure between your right and left arms. If you have a high blood pressure reading during one visit or you have normal blood pressure with other risk factors, you may be asked to: Return on a different day to have your blood pressure checked again. Monitor your blood pressure at home for 1 week or longer. If you are diagnosed with hypertension, you may have other blood or imaging tests to help your health care provider understand your overall risk for other conditions. How is this treated? This condition is treated by making healthy lifestyle changes, such as eating healthy foods, exercising more, and reducing your alcohol intake. You may be referred for counseling on a healthy diet and physical activity. Your health care provider may prescribe medicine if lifestyle changes are not enough to get your blood pressure under control and if: Your systolic blood pressure is above 130. Your diastolic blood pressure is above 80. Your personal target blood pressure may vary depending on your medical conditions, your age, and other factors. Follow these instructions at home: Eating and drinking  Eat a diet that is high in fiber and potassium, and low in sodium, added sugar, and fat. An example of this eating plan is called the DASH diet. DASH stands for Dietary Approaches to Stop Hypertension. To eat this way: Eat plenty of fresh fruits and vegetables. Try to fill one half of your plate at each meal with fruits and vegetables. Eat whole grains, such as whole-wheat pasta, brown rice, or whole-grain bread. Fill about one fourth of your plate with whole grains. Eat or drink low-fat dairy products, such as skim milk or low-fat yogurt. Avoid fatty cuts of meat, processed or cured meats, and poultry with skin. Fill  about one fourth of your plate with lean proteins, such as fish, chicken without skin, beans, eggs, or tofu. Avoid pre-made and processed foods. These tend to be higher in sodium, added sugar, and fat. Reduce your daily sodium intake. Many people with hypertension should eat less than 1,500 mg of sodium a day. Do not drink alcohol if: Your health care provider tells you not to drink. You are pregnant, may be pregnant, or are planning to become pregnant. If you drink alcohol: Limit how much you have to: 0-1 drink a day for women. 0-2 drinks a day for men. Know how much alcohol is in your drink. In the [place] one drink equals one 12 oz bottle of beer (355 mL), one 5 oz glass of wine (148 mL), or one 1 oz glass of hard liquor (44 mL). Lifestyle  Work with your health care provider to maintain a healthy body weight or to lose weight. Ask what an ideal weight is for you. Get at least 30 minutes of exercise that causes your heart to beat faster (aerobic exercise) most days of  the week. Activities may include walking, swimming, or biking. Include exercise to strengthen your muscles (resistance exercise), such as Pilates or lifting weights, as part of your weekly exercise routine. Try to do these types of exercises for 30 minutes at least 3 days a week. Do not use any products that contain nicotine or tobacco. These products include cigarettes, chewing tobacco, and vaping devices, such as e-cigarettes. If you need help quitting, ask your health care provider. Monitor your blood pressure at home as told by your health care provider. Keep all follow-up visits. This is important. Medicines Take over-the-counter and prescription medicines only as told by your health care provider. Follow directions carefully. Blood pressure medicines must be taken as prescribed. Do not skip doses of blood pressure medicine. Doing this puts you at risk for problems and can make the medicine less effective. Ask your health  care provider about side effects or reactions to medicines that you should watch for. Contact a health care provider if you: Think you are having a reaction to a medicine you are taking. Have headaches that keep coming back (recurring). Feel dizzy. Have swelling in your ankles. Have trouble with your vision. Get help right away if you: Develop a severe headache or confusion. Have unusual weakness or numbness. Feel faint. Have severe pain in your chest or abdomen. Vomit repeatedly. Have trouble breathing. These symptoms may be an emergency. Get help right away. Call 911. Do not wait to see if the symptoms will go away. Do not drive yourself to the hospital. Summary Hypertension is when the force of blood pumping through your arteries is too strong. If this condition is not controlled, it may put you at risk for serious complications. Your personal target blood pressure may vary depending on your medical conditions, your age, and other factors. For most people, a normal blood pressure is less than 120/80. Hypertension is treated with lifestyle changes, medicines, or a combination of both. Lifestyle changes include losing weight, eating a healthy, low-sodium diet, exercising more, and limiting alcohol. This information is not intended to replace advice given to you by your health care provider. Make sure you discuss any questions you have with your health care provider. Document Revised: 04/11/2021 Document Reviewed: 04/11/2021 Elsevier Patient Education  2024 Else[employer] Inc[employer]    Edwina Barth, MD [facility]

## 2023-08-15 NOTE — Assessment & Plan Note (Signed)
 Much improved after inspire procedure

## 2023-08-16 ENCOUNTER — Encounter: Payer: Self-pay | Admitting: Emergency Medicine

## 2023-08-16 ENCOUNTER — Encounter: Payer: Self-pay | Admitting: Cardiovascular Disease

## 2023-08-19 ENCOUNTER — Ambulatory Visit: Payer: Medicare HMO | Admitting: Psychology

## 2023-08-19 DIAGNOSIS — F900 Attention-deficit hyperactivity disorder, predominantly inattentive type: Secondary | ICD-10-CM

## 2023-08-19 DIAGNOSIS — F411 Generalized anxiety disorder: Secondary | ICD-10-CM

## 2023-08-19 DIAGNOSIS — F330 Major depressive disorder, recurrent, mild: Secondary | ICD-10-CM

## 2023-08-19 DIAGNOSIS — F3340 Major depressive disorder, recurrent, in remission, unspecified: Secondary | ICD-10-CM

## 2023-08-19 NOTE — Progress Notes (Addendum)
 PROGRESS NOTES:  Annual Review  Name: Mark Strong Date: 08/19/2023 MRN: [id number] DOB: 11-Mar-1971 PCP: Mark Quint, MD  Time Spent: Start: 10:00 am End:  10:58 am   Today I met with  Mark Strong in remote video ([facility]) face-to-face individual psychotherapy.  Distance Site: Client's Home Orginating Site: Dr Odette Horns Remote Office Consent: Obtained verbal consent to transmit session remotely. Patient is aware of the limitations related to participating in virtual therapy.      Presenting Problem:  Mark Strong is a 53 y.o. SWM who was originally referred by his previous therapist for depression and anxiety.  He noticed that he has become increasingly more anxious and depressed.  Most recently, his mother's health continues to worsen and become more complicated, he gets stressed thinking about her failing health, and is anticipating a time when she will need assisted living when it becomes too much for him to manage.   Background History:  Mark Strong is single and is living in his own house in [place].  He moved to [facility] in August 2019.  He moved to [facility] for financial reasons.  His parents moved down and then didn't like paying the fees for her assisted living facility.  They moved back to [place] and have been unhappy there and eventually moved back to [place], [place] to be near his sister.  After his father got gravely sick, during the COVID pandemic, he moved to [place] to be closer to his ailing father and support his mother.  A year after his father passed away, Mark Strong moved back to [place] and his mother soon followed and is now living with him.    Mother: Mark Strong "Mark Strong" (72) parents have been married for 52 years.  She hasn't worked for years because of her seizure disorder. Father: Mark Strong "Mark Strong" (deceased) he worked in [employer].  He was strict but always tried to make a better life for his family.  Mark Strong is a lot like his father and  they have a great relationship. Sister: Mark Strong (57) Mark Strong, Mark Strong she works, is divorced and has three children.  Has a son Mark Strong (43) with autism - he is smart but angry. Mark Strong [phone]) niece went to culinary school Mark Strong (47) Mark Strong,, [place] - husband works for [employer] and has his Scientific laboratory technician, two Academic librarian (6) very bright and talkative, Mark Strong (15) is also very bright and well behaved Mark Strong (56)  Mark Strong, [place] - is divorced, but he Mark Strong with his ex-wife and their son.  They help each other out financially.  He has a daughter who lives with her boyfriend.    Mental Status Exam: Appearance:   Casual and Fairly Groomed     Behavior:  Appropriate  Motor:  Normal  Speech/Language:   NA  Affect:  Appropriate  Mood:  anxious and depressed  Thought process:  normal  Thought content:    WNL  Sensory/Perceptual disturbances:    WNL  Orientation:  oriented to person, place, time/date, and situation  Attention:  Fair  Concentration:  Good  Memory:  WNL  Fund of knowledge:   Good  Insight:    Good  Judgment:   Good  Impulse Control:  Good    Risk Assessment: Danger to Self:  No Self-injurious Behavior: No Danger to Others: No Duty to Warn:no Physical Aggression / Violence:No  Access to Firearms a concern: No   Substance Abuse History: Current substance abuse: No     Past Psychiatric History:   Previous psychological history  is significant for ADHD, anxiety, and depression Outpatient Providers:  previous episode of treatment with this therapist History of Psych Hospitalization: No  Psychological Testing: Attention/ADHD:  unknown    Abuse History:  Victim of: No.,  n/a    Report needed: No. Victim of Neglect:No. Perpetrator of  n/a   Witness / Exposure to Domestic Violence: No   Protective Services Involvement: No  Witness to [employer] Violence:  No   Family History:  Family History  Problem Relation Age of Onset   Arthritis Mother    Depression Mother     Heart disease Mother    Obesity Mother    Arthritis Father    COPD Father    Diabetes Father    Hyperlipidemia Father    Hypertension Father    Kidney disease Father    Thyroid disease Father    Cancer Brother     Living situation: the patient lives with their family  Sexual Orientation: Straight  Relationship Status: single  Name of spouse / other:n/a If a parent, number of children / ages:n/a  Support Systems: parents, church members   Financial Stress:  Yes   Income/Employment/Disability: Doctor, hospital Service: No   Educational History: Education: Water quality scientist: Protestant  Any cultural differences that may affect / interfere with treatment:  not applicable   Recreation/Hobbies: home repairs, reading the bible, spiritual texts, & devotionals  Stressors: Financial difficulties   Health problems   Other: aging mother lives with him    Strengths: Family, Church, Spirituality, Hopefulness, and Self Advocate  Barriers: None  Legal History: Pending legal issue / charges:  n/a. History of legal issue / charges:  n/a  Medical History/Surgical History: reviewed Past Medical History:  Diagnosis Date   Allergy Dec1,2017   Anxiety    Arthritis 2018   Depression    Dysrhythmia    Heart murmur 2012   Memory loss    Pacemaker    PVNS (pigmented villonodular synovitis)    Sciatica    Sleep apnea    Thyroid disease    Varicose veins of right leg with edema     Past Surgical History:  Procedure Laterality Date   CARDIAC CATHETERIZATION     left and right    DRUG INDUCED ENDOSCOPY Bilateral 11/20/2022   Procedure: DRUG INDUCED SLEEP  ENDOSCOPY;  Surgeon: Christia Reading, MD;  Location: [facility];  Service: ENT;  Laterality: Bilateral;   IMPLANTATION OF HYPOGLOSSAL NERVE STIMULATOR Right 02/21/2023   Procedure: IMPLANTATION OF HYPOGLOSSAL NERVE STIMULATOR;  Surgeon: Christia Reading, MD;   Location: [facility] OR;  Service: ENT;  Laterality: Right;   INSERT / REPLACE / REMOVE PACEMAKER     Biotronik, dual-chamber, 05/18/2016 for SSS   Right greater saphenous vein ablation Right     Medications: Current Outpatient Medications  Medication Sig Dispense Refill   albuterol (VENTOLIN HFA) 108 (90 Base) MCG/ACT inhaler Inhale 2 puffs into the lungs every 6 (six) hours as needed for wheezing or shortness of breath. 8 g 6   amLODipine (NORVASC) 10 MG tablet Take 1 tablet (10 mg total) by mouth daily. 90 tablet 1   amphetamine-dextroamphetamine (ADDERALL XR) 10 MG 24 hr capsule Take 20 mg by mouth every morning.     aspirin EC 81 MG tablet Take 81 mg by mouth once.     atorvastatin (LIPITOR) 40 MG tablet TAKE 1 TABLET(40 MG) BY MOUTH DAILY 90 tablet 1   BD DISP NEEDLES 25G X  5/8" MISC      Blood Pressure Monitoring (BLOOD PRESSURE CUFF) MISC 1 Device by Does not apply route once for 1 dose. 1 each 0   buPROPion (WELLBUTRIN XL) 300 MG 24 hr tablet Take 300 mg by mouth daily.     busPIRone (BUSPAR) 5 MG tablet Take 5 mg by mouth 2 (two) times daily.     cholecalciferol (VITAMIN D3) 25 MCG (1000 UNIT) tablet Take 1,000 Units by mouth daily.     cyanocobalamin (VITAMIN B12) 1000 MCG tablet Take 1,000 mcg by mouth daily.     docusate sodium (COLACE) 100 MG capsule Take 100 mg by mouth daily as needed (Constipation).     DULoxetine (CYMBALTA) 60 MG capsule Take 60 mg by mouth 2 (two) times daily.     ezetimibe (ZETIA) 10 MG tablet Take 1 tablet (10 mg total) by mouth daily. 90 tablet 3   finasteride (PROSCAR) 5 MG tablet Take 1 tablet (5 mg total) by mouth daily. 90 tablet 3   ibuprofen (ADVIL) 800 MG tablet Take 800 mg by mouth every 8 (eight) hours as needed for mild pain or moderate pain.     levothyroxine (SYNTHROID) 75 MCG tablet Take 1 tablet (75 mcg total) by mouth every morning. 90 tablet 3   sildenafil (VIAGRA) 25 MG tablet Take 25 mg by mouth as needed for erectile dysfunction.      traZODone (DESYREL) 50 MG tablet Take 25 mg by mouth at bedtime.     No current facility-administered medications for this visit.    Allergies  Allergen Reactions   Betadine [Povidone Iodine] Rash          Individualized Treatment Plan               Strengths: motivated, engaged, curious  Supports: family, neighbors and church members   Goal/Needs for Treatment:  In order of importance to patient 1) Learn and implement relapse prevention skills that lead to the alleviation and help prevent the relapse of depression. 2) Learn and implement coping skills that result in a reduction of anxiety and worry, and improved daily functioning. 3)  Engage in social, productive, and recreational activities that are possible in spite of medical condition. 4) Comply with the medication regimen and necessary medical procedures, reporting any side effects or problems to physicians or therapists.  5) Learn and implement coping skills to reduce the disruptive influence of distractibility  and forgetfulness to improved daily functioning.        Client Statement of Needs: states he needs assistance with his depression, anxiety and ADHD.     Treatment Level: Monthly Individual Outpatient Psychotherapy  Symptoms: A diagnosis of a chronic illness that is not life-threatening, but necessitates changes in living.  Autonomic hyperactivity (e.g., palpitations, shortness of breath, dry mouth, trouble swallowing, nausea, diarrhea).   Childhood history of Attention Deficit Disorder (ADD) that was either diagnosed or later concluded due to the symptoms of behavioral problems at school, impulsivity, temper outbursts, and lack of concentration.  Depressed or irritable mood.   Diminished interest in or enjoyment of activities.  Disorganized in most areas of his/her life.  Easily distracted and drawn from task at hand.  Excessive and/or unrealistic worry that is difficult to control occurring more days than not for  at least 6 months about a number of events or activities. Feelings of hopelessness, worthlessness, or inappropriate guilt. Lack of energy.  Low self-esteem.  Psychological or behavioral factors that influence the course of the medical  condition.  Psychomotor agitation or retardation.   Sleeplessness or hypersomnia.   Social withdrawal.   Unable to concentrate or pay attention to things of low interest, even when those things are important to his/her life.   Client Treatment Preferences: Continue with present therapist   Healthcare consumer's goal for treatment:  Psychologist, Mark Favors, Mark Strong will support the patient's ability to achieve the goals identified. Cognitive Behavioral Therapy, Dialectical Behavioral Therapy, Motivational Interviewing, organizational and time management skills, and other evidenced-based practices will be used to promote progress towards healthy functioning.   Healthcare consumer Mark Strong will: Actively participate in therapy, working towards healthy functioning.    *Justification for Continuation/Discontinuation of Goal: R=Revised, O=Ongoing, A=Achieved, D=Discontinued  Goal 1) Learn and implement relapse prevention skills that lead to the alleviation and help prevent  relapse of depression. . 5 Point Likert rating baseline date: 10/20/2021  Target Date Goal Was reviewed Status Code Progress towards goal/Likert rating  11/08/2022 11/07/2021          O 3/5 - pt has introduced new lifestyle known to reduce depression  08/18/2024 08/19/2023          O 4/5 - pt continues to  maintain and improve lifestyle changes to support positive mood, uses skills to manage mood.        Goal 2) Learn and implement coping skills that result in a reduction of anxiety and worry, and improved  functioning.     5 Point Likert rating baseline date: 10/20/2021  Target Date Goal Was reviewed Status Code Progress towards goal/Likert rating   11/08/2022  5//23/2023            O 2/5 - pt implements coping skills inconsistently   08/18/2024  08/19/2023           O 4/5 - pt continues to  maintain and improve lifestyle changes to support positive mood, uses skills to manage mood.        Goal 3) Engage in social, productive, and recreational activities that are possible in spite of medical  condition  5 Point Likert rating baseline date: 10/20/2021  Target Date Goal Was reviewed Status Code Progress towards goal/Likert rating  11/08/2022 11/07/2021          O 2/5 - pt is making connections with neighbors and periodically attend church/church events  08/18/2024 08/19/2023          O 3.5/5 - pt is more socially involved with his neighbors, is productive around his house and in caring for his mother        Goal 4) Comply with the medication regimen and necessary medical procedures, reporting any side  or problems physicians or therapist.   5 Point Likert rating baseline date: 10/20/2021   Target Date Goal Was reviewed Status Code Progress towards goal/Likert rating  11/08/2022 11/07/2021          O 5/5 - pt is compliant in taking medications as prescribed  08/18/2024 08/19/2023          O 5/5 - pt is compliant in taking medications as prescribed, will continue to monitor           Goal 5) Learn and implement coping skills to reduce the disruptive influence of distractibility  and forgetfulness to improved daily functioning.  Goal  5 Point Likert rating baseline date: 10/20/2021  Target Date Goal Was reviewed Status Code Progress towards goal/Likert rating   11/08/2022  11/07/2021  O 2/5 - pt inconsistently implements skills known to reduce distractibility and forgetfulness   08/18/2024  08/19/2023           O 3.5/5 - pt continues to use skills and maintains improvements          This plan has been reviewed and created by the following participants:  This plan will be reviewed at least every 12 months. Date Behavioral Health Clinician Date Guardian/Patient   11/07/2021  Mark Favors, Mark Strong  11/07/2021 Mark Strong  08/19/2023 Mark Favors, Mark Strong 08/19/2023 Mark Strong              Diagnosis: Attention-deficit/hyperactivity disorder, Predominately inattentive presentation Generalized Anxiety Disorder  Major Depressive Disorder, recurrent, mild   Depression Rating: 3 Anxiety Rating: 3-5   Mark Strong returns to therapy after a long break.   He reports that he is preparing to sell his house because he can't keep up with it on his own.  He also states that he is needing to help his mother financially, as she continues to have more complicated heart issues and needs assisted living.  As their are a number of big stresses in his life related to his mother's and brother's health, we agreed to return to monthly sessions.  Mark Strong knows he can always request additional sessions should the need arise.   In session today, conducted pt's annual review.  We review Mark Strong's progress, d/ goals and updated her treatment plan.  He actively participated in the creation of her treatment plan and freely gave he consent.   Mark Favors, Mark Strong  [facility] - psychiatric PA, [facility]

## 2023-08-21 NOTE — Progress Notes (Signed)
 Remote pacemaker transmission.

## 2023-08-27 ENCOUNTER — Ambulatory Visit (INDEPENDENT_AMBULATORY_CARE_PROVIDER_SITE_OTHER)

## 2023-08-27 ENCOUNTER — Ambulatory Visit (HOSPITAL_BASED_OUTPATIENT_CLINIC_OR_DEPARTMENT_OTHER): Payer: Medicare HMO | Attending: Adult Health | Admitting: Pulmonary Disease

## 2023-08-27 ENCOUNTER — Ambulatory Visit: Admitting: Podiatry

## 2023-08-27 ENCOUNTER — Encounter: Payer: Self-pay | Admitting: Podiatry

## 2023-08-27 DIAGNOSIS — M79672 Pain in left foot: Secondary | ICD-10-CM

## 2023-08-27 DIAGNOSIS — M7750 Other enthesopathy of unspecified foot: Secondary | ICD-10-CM

## 2023-08-27 DIAGNOSIS — G4733 Obstructive sleep apnea (adult) (pediatric): Secondary | ICD-10-CM | POA: Insufficient documentation

## 2023-08-27 DIAGNOSIS — M7752 Other enthesopathy of left foot: Secondary | ICD-10-CM | POA: Diagnosis not present

## 2023-08-27 DIAGNOSIS — Z9682 Presence of neurostimulator: Secondary | ICD-10-CM | POA: Diagnosis not present

## 2023-08-27 NOTE — Patient Instructions (Addendum)
 Look for a UREA BASED CREAM for the calluses. Look for 30% or 40% urea cream.  ---  While at your visit today you received a steroid injection in your foot or ankle to help with your pain. Along with having the steroid medication there is some "numbing" medication in the shot that you received. Due to this you may notice some numbness to the area for the next couple of hours.   I would recommend limiting activity for the next few days to help the steroid injection take affect.    The actually benefit from the steroid injection may take up to 2-7 days to see a difference. You may actually experience a small (as in 10%) INCREASE in pain in the first 24 hours---that is common. It would be best if you can ice the area today and take anti-inflammatory medications (such as Ibuprofen, Motrin, or Aleve) if you are able to take these medications. If you were prescribed another medication to help with the pain go ahead and start that medication today    Things to watch out for that you should contact us or a health care provider urgently would include: 1. Unusual (as in more than 10%) increase in pain 2. New fever > 101.5 3. New swelling or redness of the injected area.  4. Streaking of red lines around the area injected.  If you have any questions or concerns about this, please give our office a call at [phone].

## 2023-08-27 NOTE — Progress Notes (Unsigned)
 Subjective:   Patient ID: Mark Strong, male   DOB: 53 y.o.   MRN: [id number]   HPI Chief Complaint  Patient presents with   Foot Pain    RM#11 Left foot pain has gotten worse in the last few feeks no injury to the foot unable to get it comfortable.Last injection was about 2 years ago and was effective treatment.   53 year old male presents the office the above concerns.  States he had pain more to the lateral aspect of foot last couple of weeks.  He does not report any injuries.  There is tender to walk at times and fluctuates.  No recent treatment.  No other concerns.    Review of Systems  All other systems reviewed and are negative.  Past Medical History:  Diagnosis Date   Allergy Dec1,2017   Anxiety    Arthritis 2018   Depression    Dysrhythmia    Heart murmur 2012   Memory loss    Pacemaker    PVNS (pigmented villonodular synovitis)    Sciatica    Sleep apnea    Thyroid disease    Varicose veins of right leg with edema     Past Surgical History:  Procedure Laterality Date   CARDIAC CATHETERIZATION     left and right    DRUG INDUCED ENDOSCOPY Bilateral 11/20/2022   Procedure: DRUG INDUCED SLEEP  ENDOSCOPY;  Surgeon: Christia Reading, MD;  Location: [facility];  Service: ENT;  Laterality: Bilateral;   IMPLANTATION OF HYPOGLOSSAL NERVE STIMULATOR Right 02/21/2023   Procedure: IMPLANTATION OF HYPOGLOSSAL NERVE STIMULATOR;  Surgeon: Christia Reading, MD;  Location: [facility] OR;  Service: ENT;  Laterality: Right;   INSERT / REPLACE / REMOVE PACEMAKER     Biotronik, dual-chamber, 05/18/2016 for SSS   Right greater saphenous vein ablation Right      Current Outpatient Medications:    albuterol (VENTOLIN HFA) 108 (90 Base) MCG/ACT inhaler, Inhale 2 puffs into the lungs every 6 (six) hours as needed for wheezing or shortness of breath., Disp: 8 g, Rfl: 6   amLODipine (NORVASC) 10 MG tablet, Take 1 tablet (10 mg total) by mouth daily., Disp: 90 tablet, Rfl: 1    amphetamine-dextroamphetamine (ADDERALL XR) 10 MG 24 hr capsule, Take 20 mg by mouth every morning., Disp: , Rfl:    aspirin EC 81 MG tablet, Take 81 mg by mouth once., Disp: , Rfl:    atorvastatin (LIPITOR) 40 MG tablet, TAKE 1 TABLET(40 MG) BY MOUTH DAILY, Disp: 90 tablet, Rfl: 1   BD DISP NEEDLES 25G X 5/8" MISC, , Disp: , Rfl:    buPROPion (WELLBUTRIN XL) 300 MG 24 hr tablet, Take 300 mg by mouth daily., Disp: , Rfl:    busPIRone (BUSPAR) 5 MG tablet, Take 5 mg by mouth 2 (two) times daily., Disp: , Rfl:    cholecalciferol (VITAMIN D3) 25 MCG (1000 UNIT) tablet, Take 1,000 Units by mouth daily., Disp: , Rfl:    cyanocobalamin (VITAMIN B12) 1000 MCG tablet, Take 1,000 mcg by mouth daily., Disp: , Rfl:    docusate sodium (COLACE) 100 MG capsule, Take 100 mg by mouth daily as needed (Constipation)., Disp: , Rfl:    DULoxetine (CYMBALTA) 60 MG capsule, Take 60 mg by mouth 2 (two) times daily., Disp: , Rfl:    finasteride (PROSCAR) 5 MG tablet, Take 1 tablet (5 mg total) by mouth daily., Disp: 90 tablet, Rfl: 3   ibuprofen (ADVIL) 800 MG tablet, Take 800 mg by mouth  every 8 (eight) hours as needed for mild pain or moderate pain., Disp: , Rfl:    levothyroxine (SYNTHROID) 75 MCG tablet, Take 1 tablet (75 mcg total) by mouth every morning., Disp: 90 tablet, Rfl: 3   sildenafil (VIAGRA) 25 MG tablet, Take 25 mg by mouth as needed for erectile dysfunction., Disp: , Rfl:    traZODone (DESYREL) 50 MG tablet, Take 25 mg by mouth at bedtime., Disp: , Rfl:    Blood Pressure Monitoring (BLOOD PRESSURE CUFF) MISC, 1 Device by Does not apply route once for 1 dose., Disp: 1 each, Rfl: 0   ezetimibe (ZETIA) 10 MG tablet, Take 1 tablet (10 mg total) by mouth daily., Disp: 90 tablet, Rfl: 3  Allergies  Allergen Reactions   Betadine [Povidone Iodine] Rash          Objective:  Physical Exam  General: AAO x3, NAD  Dermatological: Skin is warm, dry and supple bilateral.  There are no open sores, no  preulcerative lesions, no rash or signs of infection present.  Vascular: Dorsalis Pedis artery and Posterior Tibial artery pedal pulses are 2/4 bilateral with immedate capillary fill time. There is no pain with calf compression, swelling, warmth, erythema.   Neruologic: Grossly intact via light touch bilateral.   Musculoskeletal: Tenderness palpation of the fifth metatarsal base on the left foot there is some localized edema but there is no erythema or warmth.  There is no pain with course the peroneal tendons otherwise there is no area pinpoint tenderness.  No pain on the Achilles tendon or plantar fascia today.  No other areas of discomfort.  Gait: Unassisted, Nonantalgic.       Assessment:   5th metatarsal base pain, insertional peroneal tendinitis     Plan:  -Treatment options discussed including all alternatives, risks, and complications -Etiology of symptoms were discussed -X-rays were obtained reviewed.  3 views left foot were obtained there is no evidence of acute fracture. Calcaneal spurring present. -We discussed steroid injection agrees to proceed.  Consumed alcohol.  Mixture of 0.5 cc of Marcaine plain, 0.5 cc of dexamethasone phosphate was infiltrated into the fifth metatarsal base but complications.  Care was taken to avoid injecting into the peroneal tendon.  Postinjection care discussed.  Tolerated well. -Discussed ice daily as well as shoes, good support.  I had Bethann Berkshire, pedorthist evaluate patient's inserts but he wants to hold off on refurbishing orthotics today.  Vivi Barrack DPM     Inj 5th met base dex Orthotic worn out

## 2023-08-31 DIAGNOSIS — G4733 Obstructive sleep apnea (adult) (pediatric): Secondary | ICD-10-CM

## 2023-08-31 NOTE — Procedures (Signed)
 Patient Name:  Mark Strong, Mark Strong Date:  08/27/2023 Referring Physician:  Rubye Oaks, NP  Indications for Polysomnography The patient is a 53 year-old Male who is 6\' 2"  and weighs 263.0 lbs. His BMI equals 33.8.  A full night inspire treatment study was performed.  Polysomnogram Data A full night polysomnogram recorded the standard physiologic parameters including EEG, EOG, EMG, EKG, nasal and oral airflow.  Respiratory parameters of chest and abdominal movements were recorded with Respiratory Inductance Plethysmography belts.  Oxygen saturation was recorded by pulse oximetry.   Sleep Architecture The total recording time of the polysomnogram was 433.7 minutes.  The total sleep time was 313.5 minutes.  The patient spent 10.8% of total sleep time in Stage N1, 87.2% in Stage N2, 0.0% in Stages N3, and 1.9% in REM.  Sleep latency was 43.7 minutes.  REM latency was 304.0 minutes.  Sleep Efficiency was 72.3%.  Wake after Sleep Onset time was 76.5 minutes.  Inspire Summary The patient was on inspire therapy at levels ranging from 0.8* volts up to 1.2* volts.  The last level used in the study was 1.2* volts.  Respiratory Events The polysomnogram revealed a presence of - obstructive, 4 central, and - mixed apneas resulting in an Apnea index of 0.8 events per hour.  There were 90 hypopneas (>=3% desaturation and/or arousal) resulting in an Apnea\Hypopnea Index (AHI >=3% desaturation and/or arousal) of 18.0 events per hour.  There were 37 hypopneas (>=4% desaturation) resulting in an Apnea\Hypopnea Index (AHI >=4% desaturation) of 7.8 events per hour.  There were - Respiratory Effort Related Arousals resulting in a RERA index of - events per hour. The Respiratory Disturbance Index is 18.0 events per hour.  The snore index was 263.9 events per hour.  Mean oxygen saturation was 92.7%.  The lowest oxygen saturation during sleep was 87.0%.  Time spent <=88% oxygen saturation was 0.7 minutes  (0.2%).  Limb Activity There were - limb movements recorded.  Of this total, - were classified as PLMs.  Of the PLMs, - were associated with arousals.  The Limb Movement index was - per hour while the PLM index was - per hour.  Cardiac Summary The average pulse rate was 66.6 bpm.  The minimum pulse rate was 59.0 bpm while the maximum pulse rate was 111.0 bpm.  Cardiac rhythm was normal/abnormal.  Diagnosis: OSA s/p hypoglossal nerve stimulator implant  Recommendations: Therapeutic amplitude was found at 0.8V.Events increased as amplitude was raised suggest overstimulation. Another therapeutic level was found at 1.2 V. Please co relate clinically as to which amplitude is better tolerated. Would favor the lower amplitude of 0.8 V on this study.   This study was personally reviewed and electronically signed by: Cyril Mourning, MD Accredited Board Certified in Sleep Medicine

## 2023-09-18 DIAGNOSIS — F330 Major depressive disorder, recurrent, mild: Secondary | ICD-10-CM | POA: Diagnosis not present

## 2023-09-18 DIAGNOSIS — F900 Attention-deficit hyperactivity disorder, predominantly inattentive type: Secondary | ICD-10-CM | POA: Diagnosis not present

## 2023-09-18 DIAGNOSIS — F411 Generalized anxiety disorder: Secondary | ICD-10-CM | POA: Diagnosis not present

## 2023-09-24 ENCOUNTER — Encounter: Payer: Self-pay | Admitting: Adult Health

## 2023-10-08 ENCOUNTER — Ambulatory Visit (INDEPENDENT_AMBULATORY_CARE_PROVIDER_SITE_OTHER): Admitting: Psychology

## 2023-10-08 ENCOUNTER — Ambulatory Visit (INDEPENDENT_AMBULATORY_CARE_PROVIDER_SITE_OTHER): Payer: Medicare HMO

## 2023-10-08 DIAGNOSIS — F411 Generalized anxiety disorder: Secondary | ICD-10-CM

## 2023-10-08 DIAGNOSIS — F330 Major depressive disorder, recurrent, mild: Secondary | ICD-10-CM | POA: Diagnosis not present

## 2023-10-08 DIAGNOSIS — F900 Attention-deficit hyperactivity disorder, predominantly inattentive type: Secondary | ICD-10-CM

## 2023-10-08 DIAGNOSIS — I495 Sick sinus syndrome: Secondary | ICD-10-CM

## 2023-10-08 LAB — CUP PACEART REMOTE DEVICE CHECK
Battery Voltage: 50
Date Time Interrogation Session: 20250422084011
Implantable Lead Connection Status: 753985
Implantable Lead Connection Status: 753985
Implantable Lead Implant Date: 20171217
Implantable Lead Implant Date: 20171217
Implantable Lead Location: 753859
Implantable Lead Location: 753860
Implantable Lead Model: 377
Implantable Lead Model: 377
Implantable Lead Serial Number: 49336100
Implantable Lead Serial Number: 49685190
Implantable Pulse Generator Implant Date: 20171217
Pulse Gen Model: 394929
Pulse Gen Serial Number: 68775417

## 2023-10-08 NOTE — Progress Notes (Signed)
 PROGRESS NOTES:  Annual ReviewName: Mark Strong Date: 10/08/2023 MRN: [id number] DOB: 1971-04-08 PCP: Elvira Hammersmith, MD  Time Spent: Start: 10:01 am End:  10:59 am   Today I met with  Mark Strong in remote video (Careg[facility]) face-to-face individual psychotherapy.  Distance Site: Client's Home Orginating Site: Dr Durand Gift Remote Office Consent: Obtained verbal consent to transmit session remotely. Patient is aware of the limitations related to participating in virtual therapy.      Presenting Problem:  Mark Strong is a 53 y.o. SWM who was originally referred by his previous therapist for depression and anxiety.  He noticed that he has become increasingly more anxious and depressed.  Most recently, his mother's health continues to worsen and become more complicated, he gets stressed thinking about her failing health, and is anticipating a time when she will need assisted living when it becomes too much for him to manage.   Background History:  Mark Strong is single and is living in his own house in [place].  He moved to [facility] in August 2019.  He moved to [facility] for financial reasons.  His parents moved down and then didn't like paying the fees for her assisted living facility.  They moved back to [place] and have been unhappy there and eventually moved back to [place], [place] to be near his sister.  After his father got gravely sick, during the COVID pandemic, he moved to [place] to be closer to his ailing father and support his mother.  A year after his father passed away, Kirke moved back to [place] and his mother soon followed and is now living with him.    Mother: Mark Dickens "Milda Aline" (72) parents have been married for 52 years.  She hasn't worked for years because of her seizure disorder. Father: Mark Neat "Samuel Crock" (deceased) he worked in [employer].  He was strict but always tried to make a better life for his family.  Aiven is a lot like his father and  they have a great relationship. Sister: Mark Strong (29) [place], [place] she works, is divorced and has three children.  Has a son Mark Strong (22) with autism - he is smart but angry. Mark Strong [phone]) niece went to culinary school Mark Strong (47) [place],, [place] - husband works for [employer] and has his Scientific laboratory technician, two Academic librarian (6) very bright and talkative, Mark Strong (15) is also very bright and well behaved Mark Strong (56)  [place], [place] - is divorced, but he Mark Strong with his ex-wife and their son.  They help each other out financially.  He has a daughter who lives with her boyfriend.    Mental Status Exam: Appearance:   Casual and Fairly Groomed     Behavior:  Appropriate  Motor:  Normal  Speech/Language:   NA  Affect:  Appropriate  Mood:  anxious and depressed  Thought process:  normal  Thought content:    WNL  Sensory/Perceptual disturbances:    WNL  Orientation:  oriented to person, place, time/date, and situation  Attention:  Fair  Concentration:  Good  Memory:  WNL  Fund of knowledge:   Good  Insight:    Good  Judgment:   Good  Impulse Control:  Good    Risk Assessment: Danger to Self:  No Self-injurious Behavior: No Danger to Others: No Duty to Warn:no Physical Aggression / Violence:No  Access to Firearms a concern: No   Substance Abuse History: Current substance abuse: No     Past Psychiatric History:   Previous psychological history is significant  for ADHD, anxiety, and depression Outpatient Providers:  previous episode of treatment with this therapist History of Psych Hospitalization: No  Psychological Testing: Attention/ADHD:  unknown    Abuse History:  Victim of: No.,  n/a    Report needed: No. Victim of Neglect:No. Perpetrator of  n/a   Witness / Exposure to Domestic Violence: No   Protective Services Involvement: No  Witness to [employer] Violence:  No   Family History:  Family History  Problem Relation Age of Onset   Arthritis Mother    Depression Mother     Heart disease Mother    Obesity Mother    Arthritis Father    COPD Father    Diabetes Father    Hyperlipidemia Father    Hypertension Father    Kidney disease Father    Thyroid  disease Father    Cancer Brother     Living situation: the patient lives with their family  Sexual Orientation: Straight  Relationship Status: single  Name of spouse / other:n/a If a parent, number of children / ages:n/a  Support Systems: parents, church members   Financial Stress:  Yes   Income/Employment/Disability: Doctor, hospital Service: No   Educational History: Education: Water quality scientist: Protestant  Any cultural differences that may affect / interfere with treatment:  not applicable   Recreation/Hobbies: home repairs, reading the bible, spiritual texts, & devotionals  Stressors: Financial difficulties   Health problems   Other: aging mother lives with him    Strengths: Family, Church, Spirituality, Hopefulness, and Self Advocate  Barriers: None  Legal History: Pending legal issue / charges:  n/a. History of legal issue / charges:  n/a  Medical History/Surgical History: reviewed Past Medical History:  Diagnosis Date   Allergy Dec1,2017   Anxiety    Arthritis 2018   Depression    Dysrhythmia    Heart murmur 2012   Memory loss    Pacemaker    PVNS (pigmented villonodular synovitis)    Sciatica    Sleep apnea    Thyroid  disease    Varicose veins of right leg with edema     Past Surgical History:  Procedure Laterality Date   CARDIAC CATHETERIZATION     left and right    DRUG INDUCED ENDOSCOPY Bilateral 11/20/2022   Procedure: DRUG INDUCED SLEEP  ENDOSCOPY;  Surgeon: Virgina Grills, MD;  Location: [facility];  Service: ENT;  Laterality: Bilateral;   IMPLANTATION OF HYPOGLOSSAL NERVE STIMULATOR Right 02/21/2023   Procedure: IMPLANTATION OF HYPOGLOSSAL NERVE STIMULATOR;  Surgeon: Virgina Grills, MD;   Location: [facility] OR;  Service: ENT;  Laterality: Right;   INSERT / REPLACE / REMOVE PACEMAKER     Biotronik, dual-chamber, 05/18/2016 for SSS   Right greater saphenous vein ablation Right     Medications: Current Outpatient Medications  Medication Sig Dispense Refill   albuterol  (VENTOLIN  HFA) 108 (90 Base) MCG/ACT inhaler Inhale 2 puffs into the lungs every 6 (six) hours as needed for wheezing or shortness of breath. 8 g 6   amLODipine  (NORVASC ) 10 MG tablet Take 1 tablet (10 mg total) by mouth daily. 90 tablet 1   amphetamine-dextroamphetamine (ADDERALL XR) 10 MG 24 hr capsule Take 20 mg by mouth every morning.     aspirin EC 81 MG tablet Take 81 mg by mouth once.     atorvastatin  (LIPITOR) 40 MG tablet TAKE 1 TABLET(40 MG) BY MOUTH DAILY 90 tablet 1   BD DISP NEEDLES 25G X 5/8" MISC  Blood Pressure Monitoring (BLOOD PRESSURE CUFF) MISC 1 Device by Does not apply route once for 1 dose. 1 each 0   buPROPion (WELLBUTRIN XL) 300 MG 24 hr tablet Take 300 mg by mouth daily.     busPIRone (BUSPAR) 5 MG tablet Take 5 mg by mouth 2 (two) times daily.     cholecalciferol (VITAMIN D3) 25 MCG (1000 UNIT) tablet Take 1,000 Units by mouth daily.     cyanocobalamin  (VITAMIN B12) 1000 MCG tablet Take 1,000 mcg by mouth daily.     docusate sodium (COLACE) 100 MG capsule Take 100 mg by mouth daily as needed (Constipation).     DULoxetine  (CYMBALTA ) 60 MG capsule Take 60 mg by mouth 2 (two) times daily.     ezetimibe  (ZETIA ) 10 MG tablet Take 1 tablet (10 mg total) by mouth daily. 90 tablet 3   finasteride  (PROSCAR ) 5 MG tablet Take 1 tablet (5 mg total) by mouth daily. 90 tablet 3   ibuprofen (ADVIL) 800 MG tablet Take 800 mg by mouth every 8 (eight) hours as needed for mild pain or moderate pain.     levothyroxine  (SYNTHROID ) 75 MCG tablet Take 1 tablet (75 mcg total) by mouth every morning. 90 tablet 3   sildenafil (VIAGRA) 25 MG tablet Take 25 mg by mouth as needed for erectile dysfunction.      traZODone (DESYREL) 50 MG tablet Take 25 mg by mouth at bedtime.     No current facility-administered medications for this visit.    Allergies  Allergen Reactions   Betadine [Povidone Iodine] Rash          Individualized Treatment Plan               Strengths: motivated, engaged, curious  Supports: family, neighbors and church members   Goal/Needs for Treatment:  In order of importance to patient 1) Learn and implement relapse prevention skills that lead to the alleviation and help prevent the relapse of depression. 2) Learn and implement coping skills that result in a reduction of anxiety and worry, and improved daily functioning. 3)  Engage in social, productive, and recreational activities that are possible in spite of medical condition. 4) Comply with the medication regimen and necessary medical procedures, reporting any side effects or problems to physicians or therapists.  5) Learn and implement coping skills to reduce the disruptive influence of distractibility  and forgetfulness to improved daily functioning.        Client Statement of Needs: states he needs assistance with his depression, anxiety and ADHD.     Treatment Level: Monthly Individual Outpatient Psychotherapy  Symptoms: A diagnosis of a chronic illness that is not life-threatening, but necessitates changes in living.  Autonomic hyperactivity (e.g., palpitations, shortness of breath, dry mouth, trouble swallowing, nausea, diarrhea).   Childhood history of Attention Deficit Disorder (ADD) that was either diagnosed or later concluded due to the symptoms of behavioral problems at school, impulsivity, temper outbursts, and lack of concentration.  Depressed or irritable mood.   Diminished interest in or enjoyment of activities.  Disorganized in most areas of his/her life.  Easily distracted and drawn from task at hand.  Excessive and/or unrealistic worry that is difficult to control occurring more days than not for  at least 6 months about a number of events or activities. Feelings of hopelessness, worthlessness, or inappropriate guilt. Lack of energy.  Low self-esteem.  Psychological or behavioral factors that influence the course of the medical condition.  Psychomotor agitation or retardation.  Sleeplessness or hypersomnia.   Social withdrawal.   Unable to concentrate or pay attention to things of low interest, even when those things are important to his/her life.   Client Treatment Preferences: Continue with present therapist   Healthcare consumer's goal for treatment:  Psychologist, Elder Greening, PhD will support the patient's ability to achieve the goals identified. Cognitive Behavioral Therapy, Dialectical Behavioral Therapy, Motivational Interviewing, organizational and time management skills, and other evidenced-based practices will be used to promote progress towards healthy functioning.   Healthcare consumer Mark Strong will: Actively participate in therapy, working towards healthy functioning.    *Justification for Continuation/Discontinuation of Goal: R=Revised, O=Ongoing, A=Achieved, D=Discontinued  Goal 1) Learn and implement relapse prevention skills that lead to the alleviation and help prevent  relapse of depression. . 5 Point Likert rating baseline date: 10/20/2021  Target Date Goal Was reviewed Status Code Progress towards goal/Likert rating  11/08/2022 11/07/2021          O 3/5 - pt has introduced new lifestyle known to reduce depression  08/18/2024 08/19/2023          O 4/5 - pt continues to  maintain and improve lifestyle changes to support positive mood, uses skills to manage mood.        Goal 2) Learn and implement coping skills that result in a reduction of anxiety and worry, and improved  functioning.     5 Point Likert rating baseline date: 10/20/2021  Target Date Goal Was reviewed Status Code Progress towards goal/Likert rating   11/08/2022  5//23/2023            O 2/5 - pt implements coping skills inconsistently   08/18/2024  08/19/2023           O 4/5 - pt continues to  maintain and improve lifestyle changes to support positive mood, uses skills to manage mood.        Goal 3) Engage in social, productive, and recreational activities that are possible in spite of medical  condition  5 Point Likert rating baseline date: 10/20/2021  Target Date Goal Was reviewed Status Code Progress towards goal/Likert rating  11/08/2022 11/07/2021          O 2/5 - pt is making connections with neighbors and periodically attend church/church events  08/18/2024 08/19/2023          O 3.5/5 - pt is more socially involved with his neighbors, is productive around his house and in caring for his mother        Goal 4) Comply with the medication regimen and necessary medical procedures, reporting any side  or problems physicians or therapist.   5 Point Likert rating baseline date: 10/20/2021   Target Date Goal Was reviewed Status Code Progress towards goal/Likert rating  11/08/2022 11/07/2021          O 5/5 - pt is compliant in taking medications as prescribed  08/18/2024 08/19/2023          O 5/5 - pt is compliant in taking medications as prescribed, will continue to monitor           Goal 5) Learn and implement coping skills to reduce the disruptive influence of distractibility  and forgetfulness to improved daily functioning.  Goal  5 Point Likert rating baseline date: 10/20/2021  Target Date Goal Was reviewed Status Code Progress towards goal/Likert rating   11/08/2022  11/07/2021           O 2/5 - pt inconsistently implements  skills known to reduce distractibility and forgetfulness   08/18/2024  08/19/2023           O 3.5/5 - pt continues to use skills and maintains improvements          This plan has been reviewed and created by the following participants:  This plan will be reviewed at least every 12 months. Date Behavioral Health Clinician Date Guardian/Patient   11/07/2021  Elder Greening, PhD  11/07/2021 Mark Strong  08/19/2023 Elder Greening, PhD 08/19/2023 Mark Strong              Diagnosis: Attention-deficit/hyperactivity disorder, Predominately inattentive presentation Generalized Anxiety Disorder  Major Depressive Disorder, recurrent, mild   Depression Rating: 3 Anxiety Rating: 3-5   Mark Strong reports that he is feeling very stressed and anxious.  We d/e/p what's been according with her mother's chronic health problems, related financial stress, and the stress of becoming his mother's primary care taker.  We d/p how his sleep has improved after the surgery, feeling more energized and alert.    We d/ and checked in around lifestyle routine that supports positive mental health.  I encouraged Mark Strong to use the [facility] app to keep on top of his tasks and reward self care behaviors.   Elder Greening, PhD  [facility] - psychiatric NP, [facility] [place]

## 2023-10-15 ENCOUNTER — Ambulatory Visit: Admitting: Adult Health

## 2023-10-15 ENCOUNTER — Encounter: Payer: Self-pay | Admitting: Adult Health

## 2023-10-15 VITALS — BP 116/86 | HR 71 | Ht 74.0 in | Wt 273.6 lb

## 2023-10-15 DIAGNOSIS — I495 Sick sinus syndrome: Secondary | ICD-10-CM

## 2023-10-15 DIAGNOSIS — Z6835 Body mass index (BMI) 35.0-35.9, adult: Secondary | ICD-10-CM

## 2023-10-15 DIAGNOSIS — G4733 Obstructive sleep apnea (adult) (pediatric): Secondary | ICD-10-CM

## 2023-10-15 DIAGNOSIS — I10 Essential (primary) hypertension: Secondary | ICD-10-CM | POA: Diagnosis not present

## 2023-10-15 NOTE — Progress Notes (Signed)
 @Patient  ID: Mark Strong, male    DOB: 09/05/1970, 53 y.o.   MRN: [id number]  Chief Complaint  Patient presents with   Follow-up    Referring provider: Elvira Hammersmith, *  HPI: 53 year old male seen for pulmonary and sleep consult March 20 and 2024 for shortness of breath (resolved on follow-up visit with workup revealing normal chest x-ray and pulmonary function testing) and sleep apnea status post inspire device Medical history significant for coronary artery disease, sinus node dysfunction status post pacemaker 2017  TEST/EVENTS :  Home sleep study on September 13, 2022 showed moderate  sleep apnea with AHI at 24.8/hour.    November 20, 2022 DISE 100% anterior posterior collapse September 5, 2024Hypoglossal nerve stimulator implantation April 08, 2023 inspire activation -Sensation level was 0.2 V, functional level 0.2 V. Start delay 30 minutes, pause time 15 minutes and 8-hour duration. Functional level 0.2 to 1.2 V.   Inspire titration study 08/27/23 -Therapeutic amplitude was found at 0.8V.Events increased as amplitude was raised suggest overstimulation. Another therapeutic level was found at 1.2 V. Please co relate clinically as to which amplitude is better tolerated. Would favor the lower amplitude of 0.8 V on this study.   10/15/2023 follow-up obstructive sleep apnea Patient presents for a 47-month follow-up.  Patient has underlying severe obstructive sleep apnea that was CPAP intolerant.  He underwent hypoglossal nerve stimulator implantation September 2024.  Inspire device activation October 2024.  Last visit patient had perceived benefit and excellent compliance on inspire.  Level range was 0.2-1.2 voltage -visit voltage at 0.7 V.  Start delay 30 minutes, pause time 10 minutes in duration 9 hours.  Patient underwent inspire titration on August 27, 2023.  With therapeutic amplitude failed at 0.8 V.  It appeared as an increase as amplitude was raised suggesting  overstimulation.  At 0.8 V AHI was 3.0/hour with average O2 saturation 91.5% and 94% sleep efficiency.  We discussed his titration results in detail.  Device today was set at 0.8 V with range at 0.6 to 1.0 V.  Waveform was normal.  Since last visit patient says he is doing well.  Uses his inspire every night.  Compliance report shows excellent usage with daily average usage at 8 hours.  Says he feels that he is sleeping better and feels more rested since using inspire.  Allergies  Allergen Reactions   Betadine [Povidone Iodine] Rash    Immunization History  Administered Date(s) Administered   Influenza, Seasonal, Injecte, Preservative Fre 03/28/2023   PFIZER Comirnaty(Gray Top)Covid-19 Tri-Sucrose Vaccine 11/21/2020   PFIZER(Purple Top)SARS-COV-2 Vaccination 03/21/2020   Tdap 01/21/2019   Zoster Recombinant(Shingrix) 02/05/2022, 11/01/2022    Past Medical History:  Diagnosis Date   Allergy Dec1,2017   Anxiety    Arthritis 2018   Depression    Dysrhythmia    Heart murmur 2012   Memory loss    Pacemaker    PVNS (pigmented villonodular synovitis)    Sciatica    Sleep apnea    Thyroid  disease    Varicose veins of right leg with edema     Tobacco History: Social History   Tobacco Use  Smoking Status Never   Passive exposure: Past  Smokeless Tobacco Never   Counseling given: Not Answered   Outpatient Medications Prior to Visit  Medication Sig Dispense Refill   albuterol  (VENTOLIN  HFA) 108 (90 Base) MCG/ACT inhaler Inhale 2 puffs into the lungs every 6 (six) hours as needed for wheezing or shortness of breath. 8 g 6  amLODipine  (NORVASC ) 10 MG tablet Take 1 tablet (10 mg total) by mouth daily. 90 tablet 1   amphetamine-dextroamphetamine (ADDERALL XR) 10 MG 24 hr capsule Take 20 mg by mouth every morning.     aspirin EC 81 MG tablet Take 81 mg by mouth once.     atorvastatin  (LIPITOR) 40 MG tablet TAKE 1 TABLET(40 MG) BY MOUTH DAILY 90 tablet 1   BD DISP NEEDLES 25G X  5/8" MISC      buPROPion (WELLBUTRIN XL) 300 MG 24 hr tablet Take 300 mg by mouth daily.     busPIRone (BUSPAR) 5 MG tablet Take 5 mg by mouth 2 (two) times daily.     cholecalciferol (VITAMIN D3) 25 MCG (1000 UNIT) tablet Take 1,000 Units by mouth daily.     cyanocobalamin  (VITAMIN B12) 1000 MCG tablet Take 1,000 mcg by mouth daily.     docusate sodium (COLACE) 100 MG capsule Take 100 mg by mouth daily as needed (Constipation).     DULoxetine  (CYMBALTA ) 60 MG capsule Take 60 mg by mouth 2 (two) times daily.     finasteride  (PROSCAR ) 5 MG tablet Take 1 tablet (5 mg total) by mouth daily. 90 tablet 3   ibuprofen (ADVIL) 800 MG tablet Take 800 mg by mouth every 8 (eight) hours as needed for mild pain or moderate pain.     levothyroxine  (SYNTHROID ) 75 MCG tablet Take 1 tablet (75 mcg total) by mouth every morning. 90 tablet 3   sildenafil (VIAGRA) 25 MG tablet Take 25 mg by mouth as needed for erectile dysfunction.     traZODone (DESYREL) 50 MG tablet Take 25 mg by mouth at bedtime.     Blood Pressure Monitoring (BLOOD PRESSURE CUFF) MISC 1 Device by Does not apply route once for 1 dose. 1 each 0   ezetimibe  (ZETIA ) 10 MG tablet Take 1 tablet (10 mg total) by mouth daily. 90 tablet 3   No facility-administered medications prior to visit.     Review of Systems:   Constitutional:   No  weight loss, night sweats,  Fevers, chills, fatigue, or  lassitude.  HEENT:   No headaches,  Difficulty swallowing,  Tooth/dental problems, or  Sore throat,                No sneezing, itching, ear ache, nasal congestion, post nasal drip,   CV:  No chest pain,  Orthopnea, PND, swelling in lower extremities, anasarca, dizziness, palpitations, syncope.   GI  No heartburn, indigestion, abdominal pain, nausea, vomiting, diarrhea, change in bowel habits, loss of appetite, bloody stools.   Resp: No shortness of breath with exertion or at rest.  No excess mucus, no productive cough,  No non-productive cough,  No  coughing up of blood.  No change in color of mucus.  No wheezing.  No chest wall deformity  Skin: no rash or lesions.  GU: no dysuria, change in color of urine, no urgency or frequency.  No flank pain, no hematuria   MS:  No joint pain or swelling.  No decreased range of motion.  No back pain.    Physical Exam  BP 116/86 (BP Location: Left Arm, Patient Position: Sitting, Cuff Size: Large)   Pulse 71   Ht 6\' 2"  (1.88 m)   Wt 273 lb 9.6 oz (124.1 kg)   SpO2 98%   BMI 35.13 kg/m   GEN: A/Ox3; pleasant , NAD, well nourished    HEENT:  [place]/AT,  NOSE-clear, THROAT-clear, no lesions, no  postnasal drip or exudate noted.   NECK:  Supple w/ fair ROM; no JVD; normal carotid impulses w/o bruits; no thyromegaly or nodules palpated; no lymphadenopathy.    RESP  Clear  P & A; w/o, wheezes/ rales/ or rhonchi. no accessory muscle use, no dullness to percussion  CARD:  RRR, no m/r/g, no peripheral edema, pulses intact, no cyanosis or clubbing.  GI:   Soft & nt; nml bowel sounds; no organomegaly or masses detected.   Musco: Warm bil, no deformities or joint swelling noted.   Neuro: alert, no focal deficits noted.    Skin: Warm, no lesions or rashes    Lab Results:     BNP No results found for: "BNP"    Imaging: CUP PACEART REMOTE DEVICE CHECK Result Date: 10/08/2023 PPM Scheduled remote reviewed. Normal device function.  Presenting rhythm:  AP/VS Next remote 91 days. LA, CVRS   Administration History     None          Latest Ref Rng & Units 09/12/2022   10:52 AM  PFT Results  FVC-Pre L 5.60   FVC-Predicted Pre % 97   FVC-Post L 5.91   FVC-Predicted Post % 103   Pre FEV1/FVC % % 82   Post FEV1/FCV % % 82   FEV1-Pre L 4.60   FEV1-Predicted Pre % 103   FEV1-Post L 4.87   DLCO uncorrected ml/min/mmHg 35.72   DLCO UNC% % 108   DLCO corrected ml/min/mmHg 35.72   DLCO COR %Predicted % 108   DLVA Predicted % 108   TLC L 7.92   TLC % Predicted % 101   RV %  Predicted % 96     No results found for: "NITRICOXIDE"      Assessment & Plan:   OSA (obstructive sleep apnea) Severe obstructive sleep apnea CPAP intolerant.  Patient is doing very well on inspire.  Has perceived benefit.  Recent inspire titration shows optimal control on 0.8 V.  Voltage range set at 0.6-1.0 voltage.  Continue with start delay at 30 minutes, pause time 10 minutes with duration at 9 hours.  Patient is to follow back in our office in 6 months and as needed.  Essential hypertension Continue follow-up with primary care  SSS (sick sinus syndrome) (HCC) Continue follow-up with cardiology  BMI 35.0-35.9,adult Continue with healthy weight loss    I spent  30 minutes dedicated to the care of this patient on the date of this encounter to include pre-visit review of records, face-to-face time with the patient discussing conditions above, post visit ordering of testing, clinical documentation with the electronic health record, making appropriate referrals as documented, and communicating necessary findings to members of the patients care team.   Roena Clark, NP 10/15/2023

## 2023-10-15 NOTE — Assessment & Plan Note (Signed)
 Continue follow-up with cardiology

## 2023-10-15 NOTE — Assessment & Plan Note (Signed)
Continue follow-up with primary care 

## 2023-10-15 NOTE — Assessment & Plan Note (Signed)
 Severe obstructive sleep apnea CPAP intolerant.  Patient is doing very well on inspire.  Has perceived benefit.  Recent inspire titration shows optimal control on 0.8 V.  Voltage range set at 0.6-1.0 voltage.  Continue with start delay at 30 minutes, pause time 10 minutes with duration at 9 hours.  Patient is to follow back in our office in 6 months and as needed.

## 2023-10-15 NOTE — Patient Instructions (Addendum)
 Continue on Inspire each night .  Work on healthy weight loss Do not drive if sleepy  Follow up in 6 months with Dr. Gaynell Keeler or Allyah Heather NP and As needed

## 2023-10-15 NOTE — Assessment & Plan Note (Signed)
Continue with healthy weight loss 

## 2023-10-19 ENCOUNTER — Encounter: Payer: Self-pay | Admitting: Cardiovascular Disease

## 2023-11-25 DIAGNOSIS — F330 Major depressive disorder, recurrent, mild: Secondary | ICD-10-CM | POA: Diagnosis not present

## 2023-11-25 DIAGNOSIS — F411 Generalized anxiety disorder: Secondary | ICD-10-CM | POA: Diagnosis not present

## 2023-11-25 DIAGNOSIS — F900 Attention-deficit hyperactivity disorder, predominantly inattentive type: Secondary | ICD-10-CM | POA: Diagnosis not present

## 2023-12-09 ENCOUNTER — Telehealth: Payer: Self-pay | Admitting: Emergency Medicine

## 2023-12-09 DIAGNOSIS — F900 Attention-deficit hyperactivity disorder, predominantly inattentive type: Secondary | ICD-10-CM | POA: Diagnosis not present

## 2023-12-09 NOTE — Telephone Encounter (Signed)
 Copied from CRM [id number]. Topic: Referral - Question >> Dec 09, 2023  8:56 AM Robinson H wrote: Reason for CRM: Patient is calling with Vernell from [facility] on the line requesting a retro referral for 6/9 from provider for the psychotherapy and a physician visit. Please reach out to patient for an update  Vernell Chute [phone] fax

## 2023-12-11 NOTE — Telephone Encounter (Signed)
 LVM for patient to call back in regards of his message.. not sure what he is requesting

## 2023-12-16 NOTE — Telephone Encounter (Unsigned)
 Copied from CRM [id number]. Topic: Referral - Question >> Dec 16, 2023 12:49 PM Viola F wrote: Patient returned Lalita's phone call, says he needs back dated referral for 11/25/23 from when he went to  general psychiatry at [facility] and seen Dr. Duwaine Brooklyn to establish care and discuss medications. His insurance denied payment due to no referral. Please call him with an update at [phone] (M)

## 2023-12-17 ENCOUNTER — Other Ambulatory Visit: Payer: Self-pay | Admitting: Radiology

## 2023-12-17 DIAGNOSIS — F32A Depression, unspecified: Secondary | ICD-10-CM

## 2023-12-17 DIAGNOSIS — F419 Anxiety disorder, unspecified: Secondary | ICD-10-CM

## 2023-12-17 NOTE — Telephone Encounter (Signed)
 Spoke with patient and he was needing a referral from pcp for billing/ insurance purposes. Referral has been placed and made him aware anything regarding billing he will need to reach out to the billing department. He understood

## 2023-12-24 DIAGNOSIS — F330 Major depressive disorder, recurrent, mild: Secondary | ICD-10-CM | POA: Diagnosis not present

## 2023-12-24 DIAGNOSIS — F411 Generalized anxiety disorder: Secondary | ICD-10-CM | POA: Diagnosis not present

## 2023-12-24 DIAGNOSIS — F900 Attention-deficit hyperactivity disorder, predominantly inattentive type: Secondary | ICD-10-CM | POA: Diagnosis not present

## 2023-12-27 ENCOUNTER — Other Ambulatory Visit: Payer: Self-pay

## 2023-12-27 ENCOUNTER — Other Ambulatory Visit: Payer: Self-pay | Admitting: Emergency Medicine

## 2023-12-27 MED ORDER — EZETIMIBE 10 MG PO TABS
10.0000 mg | ORAL_TABLET | Freq: Every day | ORAL | 0 refills | Status: DC
Start: 2023-12-27 — End: 2024-01-30

## 2024-01-07 ENCOUNTER — Ambulatory Visit: Payer: Medicare HMO

## 2024-01-07 DIAGNOSIS — I495 Sick sinus syndrome: Secondary | ICD-10-CM | POA: Diagnosis not present

## 2024-01-08 ENCOUNTER — Ambulatory Visit: Payer: Self-pay | Admitting: Cardiovascular Disease

## 2024-01-08 LAB — CUP PACEART REMOTE DEVICE CHECK
Battery Voltage: 45
Date Time Interrogation Session: 20250722095916
Implantable Lead Connection Status: 753985
Implantable Lead Connection Status: 753985
Implantable Lead Implant Date: 20171217
Implantable Lead Implant Date: 20171217
Implantable Lead Location: 753859
Implantable Lead Location: 753860
Implantable Lead Model: 377
Implantable Lead Model: 377
Implantable Lead Serial Number: 49336100
Implantable Lead Serial Number: 49685190
Implantable Pulse Generator Implant Date: 20171217
Pulse Gen Model: 394929
Pulse Gen Serial Number: 68775417

## 2024-01-16 DIAGNOSIS — F330 Major depressive disorder, recurrent, mild: Secondary | ICD-10-CM | POA: Diagnosis not present

## 2024-01-16 DIAGNOSIS — F411 Generalized anxiety disorder: Secondary | ICD-10-CM | POA: Diagnosis not present

## 2024-01-16 DIAGNOSIS — F900 Attention-deficit hyperactivity disorder, predominantly inattentive type: Secondary | ICD-10-CM | POA: Diagnosis not present

## 2024-01-20 DIAGNOSIS — M25511 Pain in right shoulder: Secondary | ICD-10-CM | POA: Diagnosis not present

## 2024-01-22 NOTE — Telephone Encounter (Signed)
 Orders only

## 2024-01-30 ENCOUNTER — Other Ambulatory Visit: Payer: Self-pay | Admitting: Cardiovascular Disease

## 2024-02-12 ENCOUNTER — Ambulatory Visit: Payer: Self-pay | Admitting: Emergency Medicine

## 2024-02-12 ENCOUNTER — Ambulatory Visit: Payer: Medicare HMO | Admitting: Emergency Medicine

## 2024-02-12 VITALS — BP 130/80 | HR 68 | Temp 97.8°F | Ht 74.0 in | Wt 274.0 lb

## 2024-02-12 DIAGNOSIS — G4733 Obstructive sleep apnea (adult) (pediatric): Secondary | ICD-10-CM

## 2024-02-12 DIAGNOSIS — I484 Atypical atrial flutter: Secondary | ICD-10-CM

## 2024-02-12 DIAGNOSIS — F32A Depression, unspecified: Secondary | ICD-10-CM

## 2024-02-12 DIAGNOSIS — E785 Hyperlipidemia, unspecified: Secondary | ICD-10-CM

## 2024-02-12 DIAGNOSIS — Z950 Presence of cardiac pacemaker: Secondary | ICD-10-CM

## 2024-02-12 DIAGNOSIS — I10 Essential (primary) hypertension: Secondary | ICD-10-CM

## 2024-02-12 DIAGNOSIS — E221 Hyperprolactinemia: Secondary | ICD-10-CM | POA: Diagnosis not present

## 2024-02-12 DIAGNOSIS — F419 Anxiety disorder, unspecified: Secondary | ICD-10-CM

## 2024-02-12 DIAGNOSIS — M549 Dorsalgia, unspecified: Secondary | ICD-10-CM

## 2024-02-12 DIAGNOSIS — E039 Hypothyroidism, unspecified: Secondary | ICD-10-CM

## 2024-02-12 DIAGNOSIS — G8929 Other chronic pain: Secondary | ICD-10-CM

## 2024-02-12 LAB — COMPREHENSIVE METABOLIC PANEL WITH GFR
ALT: 20 U/L (ref 0–53)
AST: 20 U/L (ref 0–37)
Albumin: 4.6 g/dL (ref 3.5–5.2)
Alkaline Phosphatase: 56 U/L (ref 39–117)
BUN: 21 mg/dL (ref 6–23)
CO2: 26 meq/L (ref 19–32)
Calcium: 9.4 mg/dL (ref 8.4–10.5)
Chloride: 104 meq/L (ref 96–112)
Creatinine, Ser: 1.07 mg/dL (ref 0.40–1.50)
GFR: 79.29 mL/min (ref >60.00)
Glucose, Bld: 92 mg/dL (ref 70–99)
Potassium: 4.3 meq/L (ref 3.5–5.1)
Sodium: 139 meq/L (ref 135–145)
Total Bilirubin: 0.8 mg/dL (ref 0.2–1.2)
Total Protein: 7.5 g/dL (ref 6.0–8.3)

## 2024-02-12 LAB — CBC WITH DIFFERENTIAL/PLATELET
Basophils Absolute: 0 K/uL (ref 0.0–0.1)
Basophils Relative: 0.5 % (ref 0.0–3.0)
Eosinophils Absolute: 0.1 K/uL (ref 0.0–0.7)
Eosinophils Relative: 1.4 % (ref 0.0–5.0)
HCT: 41.9 % (ref 39.0–52.0)
Hemoglobin: 14 g/dL (ref 13.0–17.0)
Lymphocytes Relative: 16.3 % (ref 12.0–46.0)
Lymphs Abs: 1.2 K/uL (ref 0.7–4.0)
MCHC: 33.4 g/dL (ref 30.0–36.0)
MCV: 82.8 fl (ref 78.0–100.0)
Monocytes Absolute: 0.4 K/uL (ref 0.1–1.0)
Monocytes Relative: 5.3 % (ref 3.0–12.0)
Neutro Abs: 5.6 K/uL (ref 1.4–7.7)
Neutrophils Relative %: 76.5 % (ref 43.0–77.0)
Platelets: 272 K/uL (ref 150.0–400.0)
RBC: 5.06 Mil/uL (ref 4.22–5.81)
RDW: 14.9 % (ref 11.5–15.5)
WBC: 7.3 K/uL (ref 4.0–10.5)

## 2024-02-12 LAB — LIPID PANEL
Cholesterol: 122 mg/dL (ref 0–200)
HDL: 49.5 mg/dL (ref >39.00)
LDL Cholesterol: 60 mg/dL (ref 0–99)
NonHDL: 72.39
Total CHOL/HDL Ratio: 2
Triglycerides: 64 mg/dL (ref 0.0–149.0)
VLDL: 12.8 mg/dL (ref 0.0–40.0)

## 2024-02-12 LAB — HEMOGLOBIN A1C: Hgb A1c MFr Bld: 5.7 % (ref 4.6–6.5)

## 2024-02-12 LAB — TSH: TSH: 0.69 u[IU]/mL (ref 0.35–5.50)

## 2024-02-12 NOTE — Assessment & Plan Note (Signed)
Clinically euthyroid.  TSH done today. Continue Synthroid 75 mcg daily. 

## 2024-02-12 NOTE — Assessment & Plan Note (Signed)
 Chronic stable condition Diet and nutrition discussed Continues atorvastatin  40 mg daily

## 2024-02-12 NOTE — Assessment & Plan Note (Signed)
 BP Readings from Last 3 Encounters:  02/12/24 130/80  10/15/23 116/86  08/15/23 120/88  Well-controlled hypertension Continue amlodipine  10 mg daily Cardiovascular risks associated with hypertension discussed Benefits of exercise discussed Dietary approaches to stop hypertension discussed

## 2024-02-12 NOTE — Assessment & Plan Note (Signed)
Recently tested Not pacemaker dependent

## 2024-02-12 NOTE — Assessment & Plan Note (Signed)
 As per most recent pulmonology office visit notes,:  Severe obstructive sleep apnea CPAP intolerant. Patient is doing very well on inspire. Has perceived benefit. Recent inspire titration shows optimal control on 0.8 V. Voltage range set at 0.6-1.0 voltage. Continue with start delay at 30 minutes, pause time 10 minutes with duration at 9 hours. Patient is to follow back in our office in 6 months and as needed.

## 2024-02-12 NOTE — Assessment & Plan Note (Signed)
 Stable.  Sees orthopedist on a regular basis

## 2024-02-12 NOTE — Patient Instructions (Signed)
 Health Maintenance, Male  Adopting a healthy lifestyle and getting preventive care are important in promoting health and wellness. Ask your health care provider about:  The right schedule for you to have regular tests and exams.  Things you can do on your own to prevent diseases and keep yourself healthy.  What should I know about diet, weight, and exercise?  Eat a healthy diet    Eat a diet that includes plenty of vegetables, fruits, low-fat dairy products, and lean protein.  Do not eat a lot of foods that are high in solid fats, added sugars, or sodium.  Maintain a healthy weight  Body mass index (BMI) is a measurement that can be used to identify possible weight problems. It estimates body fat based on height and weight. Your health care provider can help determine your BMI and help you achieve or maintain a healthy weight.  Get regular exercise  Get regular exercise. This is one of the most important things you can do for your health. Most adults should:  Exercise for at least 150 minutes each week. The exercise should increase your heart rate and make you sweat (moderate-intensity exercise).  Do strengthening exercises at least twice a week. This is in addition to the moderate-intensity exercise.  Spend less time sitting. Even light physical activity can be beneficial.  Watch cholesterol and blood lipids  Have your blood tested for lipids and cholesterol at 53 years of age, then have this test every 5 years.  You may need to have your cholesterol levels checked more often if:  Your lipid or cholesterol levels are high.  You are older than 53 years of age.  You are at high risk for heart disease.  What should I know about cancer screening?  Many types of cancers can be detected early and may often be prevented. Depending on your health history and family history, you may need to have cancer screening at various ages. This may include screening for:  Colorectal cancer.  Prostate cancer.  Skin cancer.  Lung  cancer.  What should I know about heart disease, diabetes, and high blood pressure?  Blood pressure and heart disease  High blood pressure causes heart disease and increases the risk of stroke. This is more likely to develop in people who have high blood pressure readings or are overweight.  Talk with your health care provider about your target blood pressure readings.  Have your blood pressure checked:  Every 3-5 years if you are 24-52 years of age.  Every year if you are 3 years old or older.  If you are between the ages of 60 and 72 and are a current or former smoker, ask your health care provider if you should have a one-time screening for abdominal aortic aneurysm (AAA).  Diabetes  Have regular diabetes screenings. This checks your fasting blood sugar level. Have the screening done:  Once every three years after age 66 if you are at a normal weight and have a low risk for diabetes.  More often and at a younger age if you are overweight or have a high risk for diabetes.  What should I know about preventing infection?  Hepatitis B  If you have a higher risk for hepatitis B, you should be screened for this virus. Talk with your health care provider to find out if you are at risk for hepatitis B infection.  Hepatitis C  Blood testing is recommended for:  Everyone born from [birth year] through 1965.  Anyone  with known risk factors for hepatitis C.  Sexually transmitted infections (STIs)  You should be screened each year for STIs, including gonorrhea and chlamydia, if:  You are sexually active and are younger than 53 years of age.  You are older than 53 years of age and your health care provider tells you that you are at risk for this type of infection.  Your sexual activity has changed since you were last screened, and you are at increased risk for chlamydia or gonorrhea. Ask your health care provider if you are at risk.  Ask your health care provider about whether you are at high risk for HIV. Your health care provider  may recommend a prescription medicine to help prevent HIV infection. If you choose to take medicine to prevent HIV, you should first get tested for HIV. You should then be tested every 3 months for as long as you are taking the medicine.  Follow these instructions at home:  Alcohol use  Do not drink alcohol if your health care provider tells you not to drink.  If you drink alcohol:  Limit how much you have to 0-2 drinks a day.  Know how much alcohol is in your drink. In the [place] one drink equals one 12 oz bottle of beer (355 mL), one 5 oz glass of wine (148 mL), or one 1 oz glass of hard liquor (44 mL).  Lifestyle  Do not use any products that contain nicotine or tobacco. These products include cigarettes, chewing tobacco, and vaping devices, such as e-cigarettes. If you need help quitting, ask your health care provider.  Do not use street drugs.  Do not share needles.  Ask your health care provider for help if you need support or information about quitting drugs.  General instructions  Schedule regular health, dental, and eye exams.  Stay current with your vaccines.  Tell your health care provider if:  You often feel depressed.  You have ever been abused or do not feel safe at home.  Summary  Adopting a healthy lifestyle and getting preventive care are important in promoting health and wellness.  Follow your health care provider's instructions about healthy diet, exercising, and getting tested or screened for diseases.  Follow your health care provider's instructions on monitoring your cholesterol and blood pressure.  This information is not intended to replace advice given to you by your health care provider. Make sure you discuss any questions you have with your health care provider.  Document Revised: 10/24/2020 Document Reviewed: 10/24/2020  Elsevier Patient Education  2024 [employer].

## 2024-02-12 NOTE — Assessment & Plan Note (Signed)
 Diet and nutrition discussed Benefits of exercise discussed Advised to decrease amount of daily carbohydrate intake and daily calories and increase amount of plant based protein in his diet Wt Readings from Last 3 Encounters:  02/12/24 274 lb (124.3 kg)  10/15/23 273 lb 9.6 oz (124.1 kg)  08/27/23 263 lb (119.3 kg)

## 2024-02-12 NOTE — Assessment & Plan Note (Signed)
Sees psychiatrist on a regular basis Medications handled by their office 

## 2024-02-12 NOTE — Assessment & Plan Note (Signed)
 Normal most recent levels No history of prolactinoma

## 2024-02-12 NOTE — Assessment & Plan Note (Signed)
 Normal sinus rhythm on examination Atrial fibrillation Pacemaker in place Not on beta-blocker Takes 1 daily baby aspirin

## 2024-02-12 NOTE — Progress Notes (Signed)
 Mark Strong 53 y.o.   Chief Complaint  Patient presents with   Follow-up    Patient here for 6 month f/u for HTN. Patient had questions about getting the RVS vaccine. Patient wanting to increase his thyroid  medications     HISTORY OF PRESENT ILLNESS: This is a 53 y.o. male A1A here for follow-up of multiple chronic medical conditions including hypertension Inquiring about RSV vaccine Concerned about thyroid  condition or medication No other complaint or medical concerns today Overall doing better.  HPI   Prior to Admission medications   Medication Sig Start Date End Date Taking? Authorizing Provider  albuterol  (VENTOLIN  HFA) 108 (90 Base) MCG/ACT inhaler Inhale 2 puffs into the lungs every 6 (six) hours as needed for wheezing or shortness of breath. 09/05/22  Yes Gladis Leonor HERO, MD  amLODipine  (NORVASC ) 10 MG tablet Take 1 tablet (10 mg total) by mouth daily. 08/14/23  Yes [name], [name], MD  amphetamine-dextroamphetamine (ADDERALL XR) 10 MG 24 hr capsule Take 20 mg by mouth every morning. 04/18/23  Yes [provider]  aspirin EC 81 MG tablet Take 81 mg by mouth once. 05/04/20  Yes [provider]  atorvastatin  (LIPITOR) 40 MG tablet TAKE 1 TABLET(40 MG) BY MOUTH DAILY 07/03/23  Yes [name], [name], MD  BD DISP NEEDLES 25G X 5/8 MISC  09/06/22  Yes [provider]  Blood Pressure Monitoring (BLOOD PRESSURE CUFF) MISC 1 Device by Does not apply route once for 1 dose. 10/15/18 02/12/24 Yes [name], [name], MD  busPIRone (BUSPAR) 10 MG tablet Take 20 mg by mouth 2 (two) times daily. 01/18/24  Yes [provider]  cholecalciferol (VITAMIN D3) 25 MCG (1000 UNIT) tablet Take 1,000 Units by mouth daily.   Yes [provider]  cyanocobalamin  (VITAMIN B12) 1000 MCG tablet Take 1,000 mcg by mouth daily.   Yes [provider]  docusate sodium (COLACE) 100 MG capsule Take 100 mg by mouth daily as needed (Constipation).   Yes [provider]  DULoxetine  (CYMBALTA ) 60 MG capsule Take 60 mg by mouth 2 (two) times daily. 11/07/20  Yes [provider]  ezetimibe  (ZETIA ) 10 MG tablet Take 1 tablet (10 mg total) by mouth daily. PT. MUST MAKE AN APPOINTMENT IN ORDER TO RECEIVE ADDITIONAL REFILLS. SECOND ATTEMPT. 01/30/24  Yes [name], [name], MD  finasteride  (PROSCAR ) 5 MG tablet Take 1 tablet (5 mg total) by mouth daily. 02/12/23  Yes Tynleigh Birt Jose, MD  ibuprofen (ADVIL) 800 MG tablet Take 800 mg by mouth every 8 (eight) hours as needed for mild pain or moderate pain. 02/24/20  Yes [provider]  levothyroxine  (SYNTHROID ) 75 MCG tablet TAKE 1 TABLET(75 MCG) BY MOUTH EVERY MORNING 12/27/23  Yes Stina Gane, Emil Schanz, MD  sildenafil (VIAGRA) 25 MG tablet Take 25 mg by mouth as needed for erectile dysfunction. 06/08/21  Yes [provider]  traZODone (DESYREL) 50 MG tablet Take 25 mg by mouth at bedtime. 06/20/22  Yes [provider]  buPROPion (WELLBUTRIN XL) 300 MG 24 hr tablet Take 300 mg by mouth daily. Patient not taking: Reported on 02/12/2024 09/12/22   [provider]  busPIRone (BUSPAR) 5 MG tablet Take 5 mg by mouth 2 (two) times daily. Patient not taking: Reported on 02/12/2024 03/28/23   [provider]    Allergies  Allergen Reactions   Betadine [Povidone Iodine] Rash    Patient Active Problem List   Diagnosis Date Noted   BMI 35.0-35.9,adult 10/15/2023   Essential hypertension 08/15/2023  Bilateral renal cysts 02/12/2023   Chronic anxiety 12/19/2022   Chronic depression 12/19/2022   Memory difficulties 09/04/2022   Varicose veins of both lower extremities 03/13/2019   Atypical atrial flutter (HCC) 12/18/2018   Hyperprolactinemia (HCC) 10/28/2018   Hypercholesterolemia 10/15/2018   SSS (sick sinus syndrome) (HCC) 08/20/2018   Pacemaker 05/28/2018   OSA (obstructive sleep apnea) 05/28/2018   Chondromalacia of left knee 05/23/2018   Chondromalacia  of right patella 05/23/2018   Hypothyroidism 04/28/2018   Right thyroid  nodule 04/28/2018   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   Chronic back pain 03/14/2018   Bradycardia 03/14/2018    Past Medical History:  Diagnosis Date   Allergy Dec1,2017   Anxiety    Arthritis 2018   Depression    Dysrhythmia    Heart murmur 2012   Memory loss    Pacemaker    PVNS (pigmented villonodular synovitis)    Sciatica    Sleep apnea    Thyroid  disease    Varicose veins of right leg with edema     Past Surgical History:  Procedure Laterality Date   CARDIAC CATHETERIZATION     left and right    DRUG INDUCED ENDOSCOPY Bilateral 11/20/2022   Procedure: DRUG INDUCED SLEEP  ENDOSCOPY;  Surgeon: Carlie Clark, MD;  Location:  SUR[facility]ERY [facility];  Service: ENT;  Laterality: Bilateral;   IMPLANTATION OF HYPOGLOSSAL NERVE STIMULATOR Right 02/21/2023   Procedure: IMPLANTATION OF HYPOGLOSSAL NERVE STIMULATOR;  Surgeon: Carlie Clark, MD;  Location: [facility] OR;  Service: ENT;  Laterality: Right;   INSERT / REPLACE / REMOVE PACEMAKER     Biotronik, dual-chamber, 05/18/2016 for SSS   Right greater saphenous vein ablation Right     Social History   Socioeconomic History   Marital status: Single    Spouse name: Not on file   Number of children: 0   Years of education: 12   Highest education level: 12th grade  Occupational History   Occupation: Disabled  Tobacco Use   Smoking status: Never    Passive exposure: Past   Smokeless tobacco: Never  Vaping Use   Vaping status: Never Used  Substance and Sexual Activity   Alcohol use: Never   Drug use: Never   Sexual activity: Not Currently    Birth control/protection: Condom  Other Topics Concern   Not on file  Social History Narrative   Right handed   Sugar free    Mom lives with him.   Disabled    One floor home   Social Drivers of Health   Financial Resource Strain: Low Risk  (02/12/2024)   Overall Financial Resource Strain  (CARDIA)    Difficulty of Paying Living Expenses: Not hard at all  Food Insecurity: No Food Insecurity (02/12/2024)   Hunger Vital Sign    Worried About Running Out of Food in the Last Year: Never true    Ran Out of Food in the Last Year: Never true  Transportation Needs: No Transportation Needs (02/12/2024)   PRAPARE - Administrator, Civil Service (Medical): No    Lack of Transportation (Non-Medical): No  Physical Activity: Inactive (02/12/2024)   Exercise Vital Sign    Days of Exercise per Week: 3 days    Minutes of Exercise per Session: 0 min  Stress: No Stress Concern Present (02/12/2024)   [employer] of Occupational Health - Occupational Stress Questionnaire    Feeling of Stress: Only a little  Social Connections: Socially Isolated (02/12/2024)   Social Connection  and Isolation Panel    Frequency of Communication with Friends and Family: Once a week    Frequency of Social Gatherings with Friends and Family: Once a week    Attends Religious Services: Patient declined    Database administrator or Organizations: No    Attends Engineer, structural: Not on file    Marital Status: Never married  Intimate Partner Violence: Patient Unable To Answer (03/28/2023)   Humiliation, Afraid, Rape, and Kick questionnaire    Fear of Current or Ex-Partner: Patient unable to answer    Emotionally Abused: Patient unable to answer    Physically Abused: Patient unable to answer    Sexually Abused: Patient unable to answer    Family History  Problem Relation Age of Onset   Arthritis Mother    Depression Mother    Heart disease Mother    Obesity Mother    Arthritis Father    COPD Father    Diabetes Father    Hyperlipidemia Father    Hypertension Father    Kidney disease Father    Thyroid  disease Father    Cancer Brother      Review of Systems  Constitutional: Negative.  Negative for chills and fever.  HENT: Negative.  Negative for congestion and sore throat.    Respiratory: Negative.  Negative for cough and shortness of breath.   Cardiovascular: Negative.  Negative for chest pain and palpitations.  Gastrointestinal:  Negative for abdominal pain, diarrhea, nausea and vomiting.  Genitourinary: Negative.  Negative for dysuria and hematuria.  Skin: Negative.  Negative for rash.  Neurological: Negative.  Negative for dizziness and headaches.  All other systems reviewed and are negative.   Vitals:   02/12/24 1008  BP: 130/80  Pulse: 68  Temp: 97.8 F (36.6 C)  SpO2: 98%    Physical Exam Vitals reviewed.  Constitutional:      Appearance: Normal appearance. He is obese.  HENT:     Head: Normocephalic.     Mouth/Throat:     Mouth: Mucous membranes are moist.     Pharynx: Oropharynx is clear.  Eyes:     Extraocular Movements: Extraocular movements intact.     Pupils: Pupils are equal, round, and reactive to light.  Cardiovascular:     Rate and Rhythm: Normal rate and regular rhythm.     Pulses: Normal pulses.     Heart sounds: Normal heart sounds.  Pulmonary:     Effort: Pulmonary effort is normal.     Breath sounds: Normal breath sounds.  Abdominal:     Palpations: Abdomen is soft.     Tenderness: There is no abdominal tenderness.  Musculoskeletal:     Cervical back: No tenderness.     Right lower leg: No edema.  Lymphadenopathy:     Cervical: No cervical adenopathy.  Skin:    General: Skin is warm and dry.     Capillary Refill: Capillary refill takes less than 2 seconds.  Neurological:     General: No focal deficit present.     Mental Status: He is alert and oriented to person, place, and time.  Psychiatric:        Mood and Affect: Mood normal.        Behavior: Behavior normal.      ASSESSMENT & PLAN: A total of 44 minutes was spent with the patient and counseling/coordination of care regarding preparing for this visit, review of most recent office visit notes, review of pulmonology most recent office  visit notes, review  of multiple chronic medical conditions and their management, review of all medications, review of most recent bloodwork results, review of health maintenance items, education on nutrition, prognosis, documentation, and need for follow up.  Problem List Items Addressed This Visit       Cardiovascular and Mediastinum   Atypical atrial flutter (HCC)   Normal sinus rhythm on examination Atrial fibrillation Pacemaker in place Not on beta-blocker Takes 1 daily baby aspirin      Essential hypertension - Primary   BP Readings from Last 3 Encounters:  02/12/24 130/80  10/15/23 116/86  08/15/23 120/88  Well-controlled hypertension Continue amlodipine  10 mg daily Cardiovascular risks associated with hypertension discussed Benefits of exercise discussed Dietary approaches to stop hypertension discussed       Relevant Orders   CBC with Differential/Platelet   Comprehensive metabolic panel with GFR   Hemoglobin A1c     Respiratory   OSA (obstructive sleep apnea)   As per most recent pulmonology office visit notes,:  Severe obstructive sleep apnea CPAP intolerant. Patient is doing very well on inspire. Has perceived benefit. Recent inspire titration shows optimal control on 0.8 V. Voltage range set at 0.6-1.0 voltage. Continue with start delay at 30 minutes, pause time 10 minutes with duration at 9 hours. Patient is to follow back in our office in 6 months and as needed.         Endocrine   Hypothyroidism   Clinically euthyroid TSH done today Continue Synthroid  75 mcg daily      Relevant Orders   TSH   Hyperprolactinemia (HCC)   Normal most recent levels No history of prolactinoma        Other   Chronic back pain   Stable. Sees orthopedist on a regular basis       Pacemaker   Recently tested Not pacemaker dependent      Chronic anxiety   Sees psychiatrist on a regular basis Medications handled by their office      Relevant Medications   busPIRone (BUSPAR) 10 MG  tablet   Chronic depression   Sees psychiatrist on a regular basis Medications handled by their office        Relevant Medications   busPIRone (BUSPAR) 10 MG tablet   Obesity, morbid (HCC)   Diet and nutrition discussed Benefits of exercise discussed Advised to decrease amount of daily carbohydrate intake and daily calories and increase amount of plant based protein in his diet Wt Readings from Last 3 Encounters:  02/12/24 274 lb (124.3 kg)  10/15/23 273 lb 9.6 oz (124.1 kg)  08/27/23 263 lb (119.3 kg)         Relevant Orders   Hemoglobin A1c   Lipid panel   Dyslipidemia   Chronic stable condition Diet and nutrition discussed Continues atorvastatin  40 mg daily      Patient Instructions  Health Maintenance, Male Adopting a healthy lifestyle and getting preventive care are important in promoting health and wellness. Ask your health care provider about: The right schedule for you to have regular tests and exams. Things you can do on your own to prevent diseases and keep yourself healthy. What should I know about diet, weight, and exercise? Eat a healthy diet  Eat a diet that includes plenty of vegetables, fruits, low-fat dairy products, and lean protein. Do not eat a lot of foods that are high in solid fats, added sugars, or sodium. Maintain a healthy weight Body mass index (BMI) is a measurement that  can be used to identify possible weight problems. It estimates body fat based on height and weight. Your health care provider can help determine your BMI and help you achieve or maintain a healthy weight. Get regular exercise Get regular exercise. This is one of the most important things you can do for your health. Most adults should: Exercise for at least 150 minutes each week. The exercise should increase your heart rate and make you sweat (moderate-intensity exercise). Do strengthening exercises at least twice a week. This is in addition to the moderate-intensity  exercise. Spend less time sitting. Even light physical activity can be beneficial. Watch cholesterol and blood lipids Have your blood tested for lipids and cholesterol at 53 years of age, then have this test every 5 years. You may need to have your cholesterol levels checked more often if: Your lipid or cholesterol levels are high. You are older than 53 years of age. You are at high risk for heart disease. What should I know about cancer screening? Many types of cancers can be detected early and may often be prevented. Depending on your health history and family history, you may need to have cancer screening at various ages. This may include screening for: Colorectal cancer. Prostate cancer. Skin cancer. Lung cancer. What should I know about heart disease, diabetes, and high blood pressure? Blood pressure and heart disease High blood pressure causes heart disease and increases the risk of stroke. This is more likely to develop in people who have high blood pressure readings or are overweight. Talk with your health care provider about your target blood pressure readings. Have your blood pressure checked: Every 3-5 years if you are 59-31 years of age. Every year if you are 66 years old or older. If you are between the ages of 35 and 38 and are a current or former smoker, ask your health care provider if you should have a one-time screening for abdominal aortic aneurysm (AAA). Diabetes Have regular diabetes screenings. This checks your fasting blood sugar level. Have the screening done: Once every three years after age 69 if you are at a normal weight and have a low risk for diabetes. More often and at a younger age if you are overweight or have a high risk for diabetes. What should I know about preventing infection? Hepatitis B If you have a higher risk for hepatitis B, you should be screened for this virus. Talk with your health care provider to find out if you are at risk for hepatitis B  infection. Hepatitis C Blood testing is recommended for: Everyone born from [birth year] through 1965. Anyone with known risk factors for hepatitis C. Sexually transmitted infections (STIs) You should be screened each year for STIs, including gonorrhea and chlamydia, if: You are sexually active and are younger than 53 years of age. You are older than 53 years of age and your health care provider tells you that you are at risk for this type of infection. Your sexual activity has changed since you were last screened, and you are at increased risk for chlamydia or gonorrhea. Ask your health care provider if you are at risk. Ask your health care provider about whether you are at high risk for HIV. Your health care provider may recommend a prescription medicine to help prevent HIV infection. If you choose to take medicine to prevent HIV, you should first get tested for HIV. You should then be tested every 3 months for as long as you are taking the medicine.  Follow these instructions at home: Alcohol use Do not drink alcohol if your health care provider tells you not to drink. If you drink alcohol: Limit how much you have to 0-2 drinks a day. Know how much alcohol is in your drink. In the [place] one drink equals one 12 oz bottle of beer (355 mL), one 5 oz glass of wine (148 mL), or one 1 oz glass of hard liquor (44 mL). Lifestyle Do not use any products that contain nicotine or tobacco. These products include cigarettes, chewing tobacco, and vaping devices, such as e-cigarettes. If you need help quitting, ask your health care provider. Do not use street drugs. Do not share needles. Ask your health care provider for help if you need support or information about quitting drugs. General instructions Schedule regular health, dental, and eye exams. Stay current with your vaccines. Tell your health care provider if: You often feel depressed. You have ever been abused or do not feel safe at  home. Summary Adopting a healthy lifestyle and getting preventive care are important in promoting health and wellness. Follow your health care provider's instructions about healthy diet, exercising, and getting tested or screened for diseases. Follow your health care provider's instructions on monitoring your cholesterol and blood pressure. This information is not intended to replace advice given to you by your health care provider. Make sure you discuss any questions you have with your health care provider. Document Revised: 10/24/2020 Document Reviewed: 10/24/2020 Elsevier Patient Education  2024 Elsevier Inc.    Emil Schaumann, MD [facility]

## 2024-02-13 ENCOUNTER — Other Ambulatory Visit: Payer: Self-pay

## 2024-02-13 MED ORDER — AMLODIPINE BESYLATE 10 MG PO TABS: 10.0000 mg | ORAL_TABLET | Freq: Every day | ORAL | 0 refills | Status: DC

## 2024-02-20 ENCOUNTER — Other Ambulatory Visit: Payer: Self-pay | Admitting: Cardiovascular Disease

## 2024-02-20 MED ORDER — AMLODIPINE BESYLATE 10 MG PO TABS: 10.0000 mg | ORAL_TABLET | Freq: Every day | ORAL | 1 refills | Status: AC

## 2024-02-20 MED ORDER — EZETIMIBE 10 MG PO TABS: 10.0000 mg | ORAL_TABLET | Freq: Every day | ORAL | 1 refills | Status: DC

## 2024-02-20 NOTE — Telephone Encounter (Signed)
 Called informed patient. Last vist was 12/2023. To continue with refills appt is needed.  Patient is aware.  Appt schedule for 04/01/24 at 1:20pm    Medications refilled

## 2024-02-22 DIAGNOSIS — M545 Low back pain, unspecified: Secondary | ICD-10-CM | POA: Diagnosis not present

## 2024-03-03 ENCOUNTER — Other Ambulatory Visit: Payer: Self-pay | Admitting: Emergency Medicine

## 2024-03-10 ENCOUNTER — Telehealth: Payer: Self-pay

## 2024-03-10 DIAGNOSIS — F900 Attention-deficit hyperactivity disorder, predominantly inattentive type: Secondary | ICD-10-CM | POA: Diagnosis not present

## 2024-03-10 DIAGNOSIS — F330 Major depressive disorder, recurrent, mild: Secondary | ICD-10-CM | POA: Diagnosis not present

## 2024-03-10 DIAGNOSIS — F411 Generalized anxiety disorder: Secondary | ICD-10-CM | POA: Diagnosis not present

## 2024-03-10 NOTE — Telephone Encounter (Signed)
 Copied from CRM #[id number]. Topic: Referral - Question >> Mar 10, 2024  9:48 AM Tinnie BROCKS wrote: Reason for CRM: Alexia from [facility] is reaching out regarding no coverage for patients psych appts. Patient has referral to psych on file, but it looks like it was labeled authorized - covered benefit with blue cross blue shield medicare. Insurance rep states he only has Chief Technology Officer and a referral is needed for it to be authorized with them for coverage.

## 2024-03-20 NOTE — Progress Notes (Signed)
 Remote PPM Transmission

## 2024-03-26 ENCOUNTER — Other Ambulatory Visit: Payer: Self-pay | Admitting: Emergency Medicine

## 2024-03-26 ENCOUNTER — Encounter: Payer: Self-pay | Admitting: Emergency Medicine

## 2024-03-26 DIAGNOSIS — Z1211 Encounter for screening for malignant neoplasm of colon: Secondary | ICD-10-CM

## 2024-03-26 DIAGNOSIS — R195 Other fecal abnormalities: Secondary | ICD-10-CM

## 2024-04-01 ENCOUNTER — Encounter: Payer: Self-pay | Admitting: Cardiovascular Disease

## 2024-04-01 ENCOUNTER — Ambulatory Visit: Attending: Cardiovascular Disease | Admitting: Cardiovascular Disease

## 2024-04-01 VITALS — BP 102/72 | HR 68 | Ht 74.0 in | Wt 281.5 lb

## 2024-04-01 DIAGNOSIS — Z950 Presence of cardiac pacemaker: Secondary | ICD-10-CM

## 2024-04-01 DIAGNOSIS — E039 Hypothyroidism, unspecified: Secondary | ICD-10-CM | POA: Diagnosis not present

## 2024-04-01 DIAGNOSIS — I480 Paroxysmal atrial fibrillation: Secondary | ICD-10-CM

## 2024-04-01 DIAGNOSIS — I495 Sick sinus syndrome: Secondary | ICD-10-CM | POA: Diagnosis not present

## 2024-04-01 DIAGNOSIS — G4733 Obstructive sleep apnea (adult) (pediatric): Secondary | ICD-10-CM | POA: Diagnosis not present

## 2024-04-01 DIAGNOSIS — E7800 Pure hypercholesterolemia, unspecified: Secondary | ICD-10-CM | POA: Diagnosis not present

## 2024-04-01 NOTE — Patient Instructions (Signed)
 Medication Instructions:  Continue all medications *If you need a refill on your cardiac medications before your next appointment, please call your pharmacy*  Lab Work: None ordered  Testing/Procedures: None ordered  Follow-Up: At [facility], you and your health needs are our priority.  As part of our continuing mission to provide you with exceptional heart care, our providers are all part of one team.  This team includes your primary Cardiologist (physician) and Advanced Practice Providers or APPs (Physician Assistants and Nurse Practitioners) who all work together to provide you with the care you need, when you need it.  Your next appointment:  1 year   Call in May to schedule Oct appointment     Provider:  Dr.[name]   We recommend signing up for the patient portal called MyChart.  Sign up information is provided on this After Visit Summary.  MyChart is used to connect with patients for Virtual Visits (Telemedicine).  Patients are able to view lab/test results, encounter notes, upcoming appointments, etc.  Non-urgent messages can be sent to your provider as well.   To learn more about what you can do with MyChart, go to [email or web address].

## 2024-04-01 NOTE — Progress Notes (Signed)
 Cardiology Office Note:    Date:  04/01/2024   ID:  Mark Strong, DOB Apr 14, 1971, MRN [id number]  PCP:  Purcell Emil Schanz, MD  Cardiologist:  None  Electrophysiologist:  None   Referring MD: Purcell Emil Schanz, *   Chief Complaint  Patient presents with   Pacemaker Check    History of Present Illness:    Mark Strong is a 53 y.o. 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  He received a dual-chamber permanent pacemaker in 2017 in [place] .  He has mild nonobstructive CAD by coronary CT angiography performed in [place] in Nov 2021.  He has obstructive sleep apnea treated with an Inspire device.  He has treated hypothyroidism.  His only cardiovascular complaint is occasional palpitations.  These generally occur at rest, have gradual onset and gradual resolution and he believes they are related to taking Adderall XR.  He did not have these until he started that medication.  He is not using albuterol .  He has gained over 20 pounds in the last year.  He thinks this is also related to the stimulant medication.  Interrogation of his pacemaker shows very limited evidence of arrhythmia.  Essentially had 2 episodes of what appears to be ectopic atrial tachycardia lasting 6 seconds and 8 seconds, respectively (once in July and once in August).  His palpitations have been occurring more recently than that.  He feels that he has plenty of energy, he denies dyspnea or angina either at rest or with activity.  He has not had any dizziness or syncope.  He denies lower extremity edema or claudication.  He feels great since he had the Inspire device implanted.  He no longer snores and feels that he has lots of energy.    His dual-chamber Biotronik E. Luna 8 pacemaker was implanted in 2017.  The device is functioning normally in DDDR-CLS mode.  Lead parameters are excellent and estimated generator longevity is about 4  years and 3 months.  Has 92 % atrial pacing and zero ventricular pacing.  There are very few episodes of paroxysmal atrial tachycardia (only twice in the last 4 months, total of 14 seconds).  There has been no atrial fibrillation and no ventricular tachycardia.   He had a single episode of paroxysmal atrial fibrillation lasting for 36 seconds in March 2023, without recurrence since then.  Previously his device had reported a 5-minute episode of atrial fibrillation in May 2021.  The heart rate histogram distribution appears appropriate.  There is a very small percentage of time spent in tachycardia.   An echocardiogram performed 07/24/2022 shows normal findings other than a mildly dilated left atrium.  The official report states that he has stage I diastolic dysfunction but his mitral annulus diastolic velocities are pretty normal in the E/e' ratio is quite low.  On the other hand, his official report states that his left atrium is normal in size when in fact it is mildly dilated.  He moved to [place] for couple of years to take care of his ailing father, who unfortunately has passed away.  While he was in [place] he had an episode of chest pain and was referred for coronary CT angiography.  This showed widespread atherosclerotic plaque with 25% stenoses in all the major coronary territories and a 25-49% stenosis in the mid LAD artery.  The formal calcium  score was not calculated.  [name] does not remember complaining of chest pain, but did have some  shortness of breath.  He did not have an echocardiogram performed at that time.  On atorvastatin .  Uses [facility]  for ED.  Compliant with CPAP for sleep apnea.  Has chronic pain and receives chronic buprenorphine .  Takes appropriate hormone supplements for hypothyroidism and allopurinol for hyperuricemia.  Prior to pacemaker implantation he had normal coronary angiography in 2016 and EF 55%.  Minor CAD by coronary CT angiography in [place] in Nov 2021.  At  one point he took amlodipine  for presumed coronary spasm, but he has not had any recurrent symptoms after discontinuing this medication.  He has a history of endovenous laser ablation of the right greater saphenous vein.    Past Medical History:  Diagnosis Date   Allergy Dec1,2017   Anxiety    Arthritis 2018   Depression    Dysrhythmia    Heart murmur 2012   Memory loss    Pacemaker    PVNS (pigmented villonodular synovitis)    Sciatica    Sleep apnea    Thyroid  disease    Varicose veins of right leg with edema     Past Surgical History:  Procedure Laterality Date   CARDIAC CATHETERIZATION     left and right    DRUG INDUCED ENDOSCOPY Bilateral 11/20/2022   Procedure: DRUG INDUCED SLEEP  ENDOSCOPY;  Surgeon: Carlie Clark, MD;  Location: [facility];  Service: ENT;  Laterality: Bilateral;   IMPLANTATION OF HYPOGLOSSAL NERVE STIMULATOR Right 02/21/2023   Procedure: IMPLANTATION OF HYPOGLOSSAL NERVE STIMULATOR;  Surgeon: Carlie Clark, MD;  Location: [facility] OR;  Service: ENT;  Laterality: Right;   INSERT / REPLACE / REMOVE PACEMAKER     Biotronik, dual-chamber, 05/18/2016 for SSS   Right greater saphenous vein ablation Right     Current Medications: Current Meds  Medication Sig   amLODipine  (NORVASC ) 10 MG tablet Take 1 tablet (10 mg total) by mouth daily. Please keep appointment in Oct 2025 for further refills   amphetamine-dextroamphetamine (ADDERALL XR) 20 MG 24 hr capsule Take 20 mg by mouth every morning.   aspirin EC 81 MG tablet Take 81 mg by mouth once.   atorvastatin  (LIPITOR) 40 MG tablet TAKE 1 TABLET(40 MG) BY MOUTH DAILY   BD DISP NEEDLES 25G X 5/8 MISC    buPROPion (WELLBUTRIN XL) 300 MG 24 hr tablet Take 300 mg by mouth daily.   busPIRone (BUSPAR) 10 MG tablet Take 20 mg by mouth 2 (two) times daily.   cholecalciferol (VITAMIN D3) 25 MCG (1000 UNIT) tablet Take 1,000 Units by mouth daily.   cyanocobalamin  (VITAMIN B12) 1000 MCG tablet Take 1,000 mcg by  mouth daily.   docusate sodium (COLACE) 100 MG capsule Take 100 mg by mouth daily as needed (Constipation).   DULoxetine  (CYMBALTA ) 60 MG capsule Take 60 mg by mouth 2 (two) times daily.   ezetimibe  (ZETIA ) 10 MG tablet Take 1 tablet (10 mg total) by mouth daily. Please keep appointment in Oct 2025 for further refills   finasteride  (PROSCAR ) 5 MG tablet TAKE 1 TABLET (5 MG TOTAL) BY MOUTH DAILY.   levothyroxine  (SYNTHROID ) 75 MCG tablet TAKE 1 TABLET(75 MCG) BY MOUTH EVERY MORNING   traZODone (DESYREL) 50 MG tablet Take 25 mg by mouth at bedtime.     Allergies:   Povidone-iodine and Betadine [povidone iodine]   Family History: The patient's family history includes Arthritis in his father and mother; COPD in his father; Cancer in his brother; Depression in his mother; Diabetes in his father; Heart disease  in his mother; Hyperlipidemia in his father; Hypertension in his father; Kidney disease in his father; Obesity in his mother; Thyroid  disease in his father.  ROS:   Please see the history of present illness.     All other systems reviewed and are negative.  EKGs/Labs/Other Studies Reviewed:    The following studies were reviewed today: Comprehensive pacemaker check performed in the office today.  No additional changes were made to pacemaker settings.  PFTs on 09/12/2022: Normal  Echocardiogram 07/24/2022:     1. Left ventricular ejection fraction, by estimation, is 60 to 65%. Left  ventricular ejection fraction by PLAX is 62 %. The left ventricle has  normal function. The left ventricle has no regional wall motion  abnormalities. There is severe asymmetric left   ventricular hypertrophy of the basal-septal segment. Left ventricular  diastolic parameters are consistent with Grade I diastolic dysfunction  (impaired relaxation).   2. Right ventricular systolic function is normal. The right ventricular  size is normal.   3. The mitral valve is abnormal. Mild mitral valve  regurgitation.   4. The aortic valve is tricuspid. Aortic valve regurgitation is not  visualized.   Comparison(s): No significant change from prior study. 01/20/2020: LVEF  60-65%, mild MR.   On my review, there was evidence of mild left atrial dilation and there is no evidence of diastolic dysfunction  MV Decel Time: 264 msec      TR Vmax:        215.00 cm/s  MV E velocity: 58.20 cm/s    Estimated RAP:  3.00 mmHg  MV A velocity: 58.20 cm/s    RVSP:           21.5 mmHg  MV E/A ratio:  1.00  LV e' medial:    6.09 cm/s  LV E/e' medial:  9.6  LV e' lateral:   9.03 cm/s  LV E/e' lateral: 6.4   LA diam:        4.20 cm 1.75 cm/m    LA Vol (A2C):   76.4 ml 31.88 ml/m   LA Vol (A4C):   57.6 ml 24.04 ml/m   LA Biplane Vol: 68.0 ml 28.38 ml/m    EKG:    EKG Interpretation Date/Time:  Wednesday April 01 2024 13:19:57 EDT Ventricular Rate:  68 PR Interval:  152 QRS Duration:  100 QT Interval:  416 QTC Calculation: 442 R Axis:   -35  Text Interpretation: ATRIAL PACED RHYTHM Possible Left atrial enlargement Left axis deviation When compared with ECG of 10-Jan-2023 09:02, No significant change was found Confirmed by Anaclara Acklin [phone]) on 04/01/2024 1:31:55 PM       .    Recent Labs: 02/12/2024: ALT 20; BUN 21; Creatinine, Ser 1.07; Hemoglobin 14.0; Platelets 272.0; Potassium 4.3; Sodium 139; TSH 0.69  Labs from care everywhere  09/28/2021 TSH 1.799, free T40.62, negative thyroid  peroxidase antibodies Vitamin D 25-hydroxy 23 (low) 12/11/2021 Creatinine 1.02, potassium 4.7  Recent Lipid Panel    Component Value Date/Time   CHOL 122 02/12/2024 1049   CHOL 180 01/03/2023 1242   TRIG 64.0 02/12/2024 1049   HDL 49.50 02/12/2024 1049   HDL 60 01/03/2023 1242   CHOLHDL 2 02/12/2024 1049   VLDL 12.8 02/12/2024 1049   LDLCALC 60 02/12/2024 1049   LDLCALC 100 (H) 01/03/2023 1242  09/28/2021 Cholesterol 153, triglycerides 90, HDL 56, LDL 79  Physical Exam:    VS:  BP  102/72 (BP Location: Left Arm, Patient Position: Sitting)   Pulse  68   Ht 6' 2 (1.88 m)   Wt 281 lb 8 oz (127.7 kg)   SpO2 96%   BMI 36.14 kg/m     Wt Readings from Last 3 Encounters:  04/01/24 281 lb 8 oz (127.7 kg)  02/12/24 274 lb (124.3 kg)  10/15/23 273 lb 9.6 oz (124.1 kg)      General: Alert, oriented x3, no distress, severely obese.  Healthy left subclavian pacemaker site Head: no evidence of trauma, PERRL, EOMI, no exophtalmos or lid lag, no myxedema, no xanthelasma; normal ears, nose and oropharynx Neck: normal jugular venous pulsations and no hepatojugular reflux; brisk carotid pulses without delay and no carotid bruits Chest: clear to auscultation, no signs of consolidation by percussion or palpation, normal fremitus, symmetrical and full respiratory excursions Cardiovascular: normal position and quality of the apical impulse, regular rhythm, normal first and second heart sounds, no murmurs, rubs or gallops Abdomen: no tenderness or distention, no masses by palpation, no abnormal pulsatility or arterial bruits, normal bowel sounds, no hepatosplenomegaly Extremities: no clubbing, cyanosis or edema; 2+ radial, ulnar and brachial pulses bilaterally; 2+ right femoral, posterior tibial and dorsalis pedis pulses; 2+ left femoral, posterior tibial and dorsalis pedis pulses; no subclavian or femoral bruits Neurological: grossly nonfocal Psych: Normal mood and affect   ASSESSMENT:    1. SSS (sick sinus syndrome) (HCC)   2. PAF (paroxysmal atrial fibrillation) (HCC)   3. Pacemaker   4. OSA (obstructive sleep apnea)   5. Hypercholesterolemia   6. Acquired hypothyroidism   7. Severe obesity (BMI 35.0-39.9) with comorbidity (HCC)      PLAN:    In order of problems listed above:  SSS: Excellent heart rate histogram distribution with the current sensor settings AFib: Very brief episodes have been seen twice in the last 4 years.  Anticoagulation is not indicated.  Also has a  very low embolic risk based on her CHA2DS2-VASc score of 1. Pacemaker: Normal device function.  Virtually 100% atrial paced, ventricular sensed rhythm.  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  Continue remote downloads every 3 months OSA: Excellent relief of symptoms with the Inspire device, despite weight gain HLP: Due to heavy calcification in his coronary arteries Target LDL is less than 70 and he has achieved that with combination ezetimibe  and atorvastatin .  Continue same medications Hypothyroidism: Appears euthyroid both clinically and based on his most recent TSH. Obesity: Hemoglobin A1c approaching prediabetes range.  Otherwise well metabolically compensated.  Would benefit from weight loss.    Medication Adjustments/Labs and Tests Ordered: Current medicines are reviewed at length with the patient today.  Concerns regarding medicines are outlined above.  Orders Placed This Encounter  Procedures   EKG 12-Lead   No orders of the defined types were placed in this encounter.   Patient Instructions  Medication Instructions:  Continue all medications *If you need a refill on your cardiac medications before your next appointment, please call your pharmacy*  Lab Work: None ordered  Testing/Procedures: None ordered  Follow-Up: At [facility], you and your health needs are our priority.  As part of our continuing mission to provide you with exceptional heart care, our providers are all part of one team.  This team includes your primary Cardiologist (physician) and Advanced Practice Providers or APPs (Physician Assistants and Nurse Practitioners) who all work together to provide you with the care you need, when you need it.  Your next appointment:  1 year   Call in May to schedule Oct appointment     Provider:  Dr.Sekai Gitlin   We recommend signing up for  the patient portal called MyChart.  Sign up information is provided on this After Visit Summary.  MyChart is used to connect with patients for Virtual Visits (Telemedicine).  Patients are able to view lab/test results, encounter notes, upcoming appointments, etc.  Non-urgent messages can be sent to your provider as well.   To learn more about what you can do with MyChart, go to [email or web address].         Signed, Jerel Balding, MD  04/01/2024 1:59 PM    [facility]

## 2024-04-02 ENCOUNTER — Ambulatory Visit: Admitting: Pulmonary Disease

## 2024-04-02 VITALS — BP 128/85 | HR 95 | Temp 98.1°F | Ht 74.0 in | Wt 281.0 lb

## 2024-04-02 DIAGNOSIS — G4733 Obstructive sleep apnea (adult) (pediatric): Secondary | ICD-10-CM

## 2024-04-02 NOTE — Patient Instructions (Signed)
 Your device was tested today and it is working well  We did not change the voltage Most significant is that you are continuing to tolerate the device well and waking up feeling rested, not tired during the day  The interrogation of the device shows it is working well  I will see you back in 6 months  Call us  with any significant concerns

## 2024-04-02 NOTE — Progress Notes (Signed)
 Mark Strong    [id number]    10-28-1970  Primary Care Physician:Sagardia, Emil Schanz, MD  Referring Physician: Purcell Emil Schanz, MD [address] [place],  [place] [address]  Chief complaint:   Patient with moderate obstructive sleep apnea inspire device in place In for follow-up  HPI:  Has been doing relatively well  Compliant with his device use on a nightly basis with download from his device on sleep setting showing excellent compliance with nightly use  Sleep study September 13, 2022 showed moderate sleep apnea with AHI of 24.8 He had a drug-induced sleep endoscopy on June 2024 February 21, 2023 Inspire device implantation Activation April 08, 2023  He is feeling well functioning well Feels he is benefiting from device use  Denies significant daytime symptoms continues to feel much better  Outpatient Encounter Medications as of 04/02/2024  Medication Sig   albuterol  (VENTOLIN  HFA) 108 (90 Base) MCG/ACT inhaler Inhale 2 puffs into the lungs every 6 (six) hours as needed for wheezing or shortness of breath. (Patient not taking: Reported on 04/01/2024)   amLODipine  (NORVASC ) 10 MG tablet Take 1 tablet (10 mg total) by mouth daily. Please keep appointment in Oct 2025 for further refills   amphetamine-dextroamphetamine (ADDERALL XR) 10 MG 24 hr capsule Take 20 mg by mouth every morning.   amphetamine-dextroamphetamine (ADDERALL XR) 20 MG 24 hr capsule Take 20 mg by mouth every morning.   aspirin EC 81 MG tablet Take 81 mg by mouth once.   atorvastatin  (LIPITOR) 40 MG tablet TAKE 1 TABLET(40 MG) BY MOUTH DAILY   BD DISP NEEDLES 25G X 5/8 MISC    Blood Pressure Monitoring (BLOOD PRESSURE CUFF) MISC 1 Device by Does not apply route once for 1 dose. (Patient not taking: Reported on 04/01/2024)   buPROPion (WELLBUTRIN XL) 300 MG 24 hr tablet Take 300 mg by mouth daily.   busPIRone (BUSPAR) 10 MG tablet Take 20 mg by mouth 2 (two) times daily.    cholecalciferol (VITAMIN D3) 25 MCG (1000 UNIT) tablet Take 1,000 Units by mouth daily.   cyanocobalamin  (VITAMIN B12) 1000 MCG tablet Take 1,000 mcg by mouth daily.   docusate sodium (COLACE) 100 MG capsule Take 100 mg by mouth daily as needed (Constipation).   DULoxetine  (CYMBALTA ) 60 MG capsule Take 60 mg by mouth 2 (two) times daily.   ezetimibe  (ZETIA ) 10 MG tablet Take 1 tablet (10 mg total) by mouth daily. Please keep appointment in Oct 2025 for further refills   finasteride  (PROSCAR ) 5 MG tablet TAKE 1 TABLET (5 MG TOTAL) BY MOUTH DAILY.   ibuprofen (ADVIL) 800 MG tablet Take 800 mg by mouth every 8 (eight) hours as needed for mild pain or moderate pain. (Patient not taking: Reported on 04/01/2024)   levothyroxine  (SYNTHROID ) 75 MCG tablet TAKE 1 TABLET(75 MCG) BY MOUTH EVERY MORNING   sildenafil (VIAGRA) 25 MG tablet Take 25 mg by mouth as needed for erectile dysfunction. (Patient not taking: Reported on 04/01/2024)   traZODone (DESYREL) 50 MG tablet Take 25 mg by mouth at bedtime.   No facility-administered encounter medications on file as of 04/02/2024.    Allergies as of 04/02/2024 - Review Complete 04/01/2024  Allergen Reaction Noted   Povidone-iodine Dermatitis 04/01/2024   Betadine [povidone iodine] Rash 04/03/2018    Past Medical History:  Diagnosis Date   Allergy Dec1,2017   Anxiety    Arthritis 2018   Depression    Dysrhythmia  Heart murmur 2012   Memory loss    Pacemaker    PVNS (pigmented villonodular synovitis)    Sciatica    Sleep apnea    Thyroid  disease    Varicose veins of right leg with edema     Past Surgical History:  Procedure Laterality Date   CARDIAC CATHETERIZATION     left and right    DRUG INDUCED ENDOSCOPY Bilateral 11/20/2022   Procedure: DRUG INDUCED SLEEP  ENDOSCOPY;  Surgeon: Carlie Clark, MD;  Location: [facility];  Service: ENT;  Laterality: Bilateral;   IMPLANTATION OF HYPOGLOSSAL NERVE STIMULATOR Right 02/21/2023    Procedure: IMPLANTATION OF HYPOGLOSSAL NERVE STIMULATOR;  Surgeon: Carlie Clark, MD;  Location: [facility] OR;  Service: ENT;  Laterality: Right;   INSERT / REPLACE / REMOVE PACEMAKER     Biotronik, dual-chamber, 05/18/2016 for SSS   Right greater saphenous vein ablation Right     Family History  Problem Relation Age of Onset   Arthritis Mother    Depression Mother    Heart disease Mother    Obesity Mother    Arthritis Father    COPD Father    Diabetes Father    Hyperlipidemia Father    Hypertension Father    Kidney disease Father    Thyroid  disease Father    Cancer Brother     Social History   Socioeconomic History   Marital status: Single    Spouse name: Not on file   Number of children: 0   Years of education: 12   Highest education level: 12th grade  Occupational History   Occupation: Disabled  Tobacco Use   Smoking status: Never    Passive exposure: Past   Smokeless tobacco: Never  Vaping Use   Vaping status: Never Used  Substance and Sexual Activity   Alcohol use: Never   Drug use: Never   Sexual activity: Not Currently    Birth control/protection: Condom  Other Topics Concern   Not on file  Social History Narrative   Right handed   Sugar free    Mom lives with him.   Disabled    One floor home   Social Drivers of Health   Financial Resource Strain: Low Risk  (02/12/2024)   Overall Financial Resource Strain (CARDIA)    Difficulty of Paying Living Expenses: Not hard at all  Food Insecurity: No Food Insecurity (02/12/2024)   Hunger Vital Sign    Worried About Running Out of Food in the Last Year: Never true    Ran Out of Food in the Last Year: Never true  Transportation Needs: No Transportation Needs (02/12/2024)   PRAPARE - Administrator, Civil Service (Medical): No    Lack of Transportation (Non-Medical): No  Physical Activity: Inactive (02/12/2024)   Exercise Vital Sign    Days of Exercise per Week: 3 days    Minutes of Exercise per Session:  0 min  Stress: No Stress Concern Present (02/12/2024)   [employer] of Occupational Health - Occupational Stress Questionnaire    Feeling of Stress: Only a little  Social Connections: Socially Isolated (02/12/2024)   Social Connection and Isolation Panel    Frequency of Communication with Friends and Family: Once a week    Frequency of Social Gatherings with Friends and Family: Once a week    Attends Religious Services: Patient declined    Database administrator or Organizations: No    Attends Banker Meetings: Not on file  Marital Status: Never married  Intimate Partner Violence: Patient Unable To Answer (03/28/2023)   Humiliation, Afraid, Rape, and Kick questionnaire    Fear of Current or Ex-Partner: Patient unable to answer    Emotionally Abused: Patient unable to answer    Physically Abused: Patient unable to answer    Sexually Abused: Patient unable to answer    Review of Systems  Respiratory:  Positive for apnea.   Psychiatric/Behavioral:  Positive for sleep disturbance.     Vitals:   04/02/24 1254  BP: 128/85  Pulse: 95  Temp: 98.1 F (36.7 C)  SpO2: 97%     Physical Exam Constitutional:      Appearance: He is obese.  HENT:     Head: Normocephalic.     Mouth/Throat:     Mouth: Mucous membranes are moist.  Eyes:     General: No scleral icterus. Cardiovascular:     Rate and Rhythm: Normal rate and regular rhythm.     Heart sounds: No murmur heard.    No friction rub.  Pulmonary:     Effort: No respiratory distress.     Breath sounds: No stridor. No wheezing or rhonchi.  Musculoskeletal:     Cervical back: No rigidity or tenderness.  Neurological:     Mental Status: He is alert.  Psychiatric:        Mood and Affect: Mood normal.    Data Reviewed: His device was interrogated in the office today  Setting of 0.8 Start delay of 30, pause time of 10 therapy duration of 9 hours  Download shows nightly use of 97% Hours used per night  of 8 hours 36 minutes    Assessment/Plan: Obstructive sleep apnea  Post inspire device placement  Appears to be doing well, sleeping well, functioning well Benefiting from device use  Encouraged to call with significant concerns  Follow-up in 6 months     Jennet Epley MD [facility] 04/02/2024, 1:12 PM  CC: Purcell Ray, *

## 2024-04-07 ENCOUNTER — Ambulatory Visit: Payer: Medicare HMO

## 2024-04-07 DIAGNOSIS — I495 Sick sinus syndrome: Secondary | ICD-10-CM

## 2024-04-09 LAB — CUP PACEART REMOTE DEVICE CHECK
Date Time Interrogation Session: 20251021084633
Implantable Lead Connection Status: 753985
Implantable Lead Connection Status: 753985
Implantable Lead Implant Date: 20171217
Implantable Lead Implant Date: 20171217
Implantable Lead Location: 753859
Implantable Lead Location: 753860
Implantable Lead Model: 377
Implantable Lead Model: 377
Implantable Lead Serial Number: 49336100
Implantable Lead Serial Number: 49685190
Implantable Pulse Generator Implant Date: 20171217
Pulse Gen Model: 394929
Pulse Gen Serial Number: 68775417

## 2024-04-10 NOTE — Progress Notes (Signed)
 Remote PPM Transmission

## 2024-04-13 ENCOUNTER — Ambulatory Visit: Payer: Self-pay | Admitting: Cardiovascular Disease

## 2024-04-22 ENCOUNTER — Other Ambulatory Visit: Payer: Self-pay | Admitting: Cardiovascular Disease

## 2024-05-01 ENCOUNTER — Other Ambulatory Visit: Payer: Self-pay | Admitting: Cardiovascular Disease

## 2024-05-04 MED ORDER — EZETIMIBE 10 MG PO TABS
10.0000 mg | ORAL_TABLET | Freq: Every day | ORAL | 3 refills | Status: AC
Start: 2024-05-04 — End: ?

## 2024-05-05 DIAGNOSIS — F330 Major depressive disorder, recurrent, mild: Secondary | ICD-10-CM | POA: Diagnosis not present

## 2024-05-05 DIAGNOSIS — F411 Generalized anxiety disorder: Secondary | ICD-10-CM | POA: Diagnosis not present

## 2024-05-05 DIAGNOSIS — F900 Attention-deficit hyperactivity disorder, predominantly inattentive type: Secondary | ICD-10-CM | POA: Diagnosis not present

## 2024-07-05 ENCOUNTER — Encounter: Payer: Self-pay | Admitting: Cardiovascular Disease

## 2024-07-07 ENCOUNTER — Telehealth: Payer: Self-pay | Admitting: Pulmonary Disease

## 2024-07-07 ENCOUNTER — Ambulatory Visit: Payer: Medicare HMO

## 2024-07-07 DIAGNOSIS — I495 Sick sinus syndrome: Secondary | ICD-10-CM

## 2024-07-07 NOTE — Telephone Encounter (Signed)
 New message   Communication  Reason for CRM: patient is calling because he got notification its time to schedule follow up but patient had to relocate because mom is on hospice right now  and dont know when he will be back

## 2024-07-08 LAB — CUP PACEART REMOTE DEVICE CHECK
Battery Voltage: 45
Date Time Interrogation Session: 20260120092526
Implantable Lead Connection Status: 753985
Implantable Lead Connection Status: 753985
Implantable Lead Implant Date: 20171217
Implantable Lead Implant Date: 20171217
Implantable Lead Location: 753859
Implantable Lead Location: 753860
Implantable Lead Model: 377
Implantable Lead Model: 377
Implantable Lead Serial Number: 49336100
Implantable Lead Serial Number: 49685190
Implantable Pulse Generator Implant Date: 20171217
Pulse Gen Model: 394929
Pulse Gen Serial Number: 68775417

## 2024-07-10 NOTE — Progress Notes (Signed)
 Remote PPM Transmission

## 2024-07-12 ENCOUNTER — Ambulatory Visit: Payer: Self-pay | Admitting: Cardiovascular Disease

## 2024-07-17 NOTE — Telephone Encounter (Signed)
 FYI

## 2024-08-17 ENCOUNTER — Ambulatory Visit: Admitting: Emergency Medicine

## 2024-10-06 ENCOUNTER — Ambulatory Visit

## 2025-01-05 ENCOUNTER — Ambulatory Visit

## 2025-04-06 ENCOUNTER — Ambulatory Visit

## 2025-07-06 ENCOUNTER — Ambulatory Visit

## 2025-10-05 ENCOUNTER — Ambulatory Visit
